# Patient Record
Sex: Female | Born: 1955
Health system: Southern US, Community
[De-identification: ages and names within clinical notes are randomized; demographics above are authoritative.]

## PROBLEM LIST (undated history)

## (undated) DIAGNOSIS — N809 Endometriosis, unspecified: Secondary | ICD-10-CM

## (undated) DIAGNOSIS — IMO0002 Reserved for concepts with insufficient information to code with codable children: Secondary | ICD-10-CM

## (undated) DIAGNOSIS — M1990 Unspecified osteoarthritis, unspecified site: Secondary | ICD-10-CM

## (undated) HISTORY — DX: Endometriosis, unspecified: N80.9

## (undated) HISTORY — DX: Reserved for concepts with insufficient information to code with codable children: IMO0002

---

## 1986-06-03 HISTORY — PX: OVARIAN CYST REMOVAL: SHX89

## 1987-06-04 HISTORY — PX: LAPAROSCOPY: SHX197

## 1999-10-04 ENCOUNTER — Encounter: Admission: RE | Admit: 1999-10-04 | Discharge: 1999-10-04 | Payer: Self-pay | Admitting: Obstetrics and Gynecology

## 1999-10-04 ENCOUNTER — Encounter: Payer: Self-pay | Admitting: *Deleted

## 1999-10-11 ENCOUNTER — Other Ambulatory Visit: Admission: RE | Admit: 1999-10-11 | Discharge: 1999-10-11 | Payer: Self-pay | Admitting: *Deleted

## 2000-10-10 ENCOUNTER — Encounter: Admission: RE | Admit: 2000-10-10 | Discharge: 2000-10-10 | Payer: Self-pay | Admitting: *Deleted

## 2000-10-10 ENCOUNTER — Other Ambulatory Visit: Admission: RE | Admit: 2000-10-10 | Discharge: 2000-10-10 | Payer: Self-pay | Admitting: *Deleted

## 2000-10-10 ENCOUNTER — Encounter: Payer: Self-pay | Admitting: *Deleted

## 2001-11-03 ENCOUNTER — Encounter: Payer: Self-pay | Admitting: *Deleted

## 2001-11-03 ENCOUNTER — Encounter: Admission: RE | Admit: 2001-11-03 | Discharge: 2001-11-03 | Payer: Self-pay | Admitting: *Deleted

## 2001-12-31 ENCOUNTER — Other Ambulatory Visit: Admission: RE | Admit: 2001-12-31 | Discharge: 2001-12-31 | Payer: Self-pay | Admitting: *Deleted

## 2003-01-20 ENCOUNTER — Encounter: Payer: Self-pay | Admitting: *Deleted

## 2003-01-20 ENCOUNTER — Encounter: Admission: RE | Admit: 2003-01-20 | Discharge: 2003-01-20 | Payer: Self-pay | Admitting: *Deleted

## 2003-01-26 ENCOUNTER — Other Ambulatory Visit: Admission: RE | Admit: 2003-01-26 | Discharge: 2003-01-26 | Payer: Self-pay | Admitting: *Deleted

## 2003-08-04 ENCOUNTER — Encounter: Admission: RE | Admit: 2003-08-04 | Discharge: 2003-08-04 | Payer: Self-pay | Admitting: Family Medicine

## 2004-02-24 ENCOUNTER — Other Ambulatory Visit: Admission: RE | Admit: 2004-02-24 | Discharge: 2004-02-24 | Payer: Self-pay | Admitting: *Deleted

## 2004-03-05 ENCOUNTER — Encounter: Admission: RE | Admit: 2004-03-05 | Discharge: 2004-03-05 | Payer: Self-pay | Admitting: *Deleted

## 2004-04-11 ENCOUNTER — Encounter: Admission: RE | Admit: 2004-04-11 | Discharge: 2004-04-11 | Payer: Self-pay | Admitting: Neurology

## 2004-05-08 ENCOUNTER — Encounter: Admission: RE | Admit: 2004-05-08 | Discharge: 2004-05-08 | Payer: Self-pay | Admitting: Neurosurgery

## 2004-05-08 ENCOUNTER — Encounter: Payer: Self-pay | Admitting: Neurosurgery

## 2004-05-25 ENCOUNTER — Encounter: Admission: RE | Admit: 2004-05-25 | Discharge: 2004-05-25 | Payer: Self-pay | Admitting: Neurosurgery

## 2004-06-03 HISTORY — PX: SPINAL FUSION: SHX223

## 2004-06-15 ENCOUNTER — Encounter: Admission: RE | Admit: 2004-06-15 | Discharge: 2004-06-15 | Payer: Self-pay | Admitting: *Deleted

## 2004-06-22 ENCOUNTER — Ambulatory Visit (HOSPITAL_COMMUNITY): Admission: RE | Admit: 2004-06-22 | Discharge: 2004-06-23 | Payer: Self-pay | Admitting: Neurosurgery

## 2005-02-26 ENCOUNTER — Other Ambulatory Visit: Admission: RE | Admit: 2005-02-26 | Discharge: 2005-02-26 | Payer: Self-pay | Admitting: *Deleted

## 2005-05-01 ENCOUNTER — Encounter: Admission: RE | Admit: 2005-05-01 | Discharge: 2005-05-01 | Payer: Self-pay | Admitting: *Deleted

## 2006-01-24 ENCOUNTER — Ambulatory Visit: Payer: Self-pay | Admitting: Sports Medicine

## 2006-02-28 ENCOUNTER — Other Ambulatory Visit: Admission: RE | Admit: 2006-02-28 | Discharge: 2006-02-28 | Payer: Self-pay | Admitting: Obstetrics & Gynecology

## 2006-05-07 ENCOUNTER — Encounter: Admission: RE | Admit: 2006-05-07 | Discharge: 2006-05-07 | Payer: Self-pay | Admitting: Obstetrics and Gynecology

## 2007-03-31 ENCOUNTER — Other Ambulatory Visit: Admission: RE | Admit: 2007-03-31 | Discharge: 2007-03-31 | Payer: Self-pay | Admitting: Obstetrics & Gynecology

## 2007-04-17 ENCOUNTER — Ambulatory Visit: Payer: Self-pay | Admitting: Internal Medicine

## 2007-05-11 ENCOUNTER — Encounter: Admission: RE | Admit: 2007-05-11 | Discharge: 2007-05-11 | Payer: Self-pay | Admitting: Obstetrics and Gynecology

## 2007-07-03 ENCOUNTER — Ambulatory Visit: Payer: Self-pay | Admitting: Internal Medicine

## 2007-07-03 ENCOUNTER — Encounter: Payer: Self-pay | Admitting: Internal Medicine

## 2008-05-02 ENCOUNTER — Other Ambulatory Visit: Admission: RE | Admit: 2008-05-02 | Discharge: 2008-05-02 | Payer: Self-pay | Admitting: Obstetrics and Gynecology

## 2008-07-20 ENCOUNTER — Encounter: Admission: RE | Admit: 2008-07-20 | Discharge: 2008-07-20 | Payer: Self-pay | Admitting: Obstetrics and Gynecology

## 2009-08-07 ENCOUNTER — Encounter: Admission: RE | Admit: 2009-08-07 | Discharge: 2009-08-07 | Payer: Self-pay | Admitting: Obstetrics and Gynecology

## 2009-12-26 ENCOUNTER — Ambulatory Visit: Payer: Self-pay | Admitting: Sports Medicine

## 2009-12-26 DIAGNOSIS — M7710 Lateral epicondylitis, unspecified elbow: Secondary | ICD-10-CM | POA: Insufficient documentation

## 2009-12-26 DIAGNOSIS — M25529 Pain in unspecified elbow: Secondary | ICD-10-CM | POA: Insufficient documentation

## 2010-02-07 ENCOUNTER — Ambulatory Visit: Payer: Self-pay | Admitting: Sports Medicine

## 2010-03-22 ENCOUNTER — Ambulatory Visit: Payer: Self-pay | Admitting: Sports Medicine

## 2010-06-23 ENCOUNTER — Encounter: Payer: Self-pay | Admitting: Neurosurgery

## 2010-06-24 ENCOUNTER — Encounter: Payer: Self-pay | Admitting: Obstetrics and Gynecology

## 2010-07-03 NOTE — Assessment & Plan Note (Signed)
Summary: f/u eo   Vital Signs:  Patient profile:   55 year old female BP sitting:   110 / 68  Vitals Entered By: Lillia Pauls CMA (February 07, 2010 4:05 PM)  History of Present Illness: Rachel Beasley returns saying that pain is 20% to 30% better however, this is with more activity she is playing tennis again she is up to 4 lbs on wrist rehab able to do exercises without much pain  pain really occurs more with lifting something with arm stretched out naprosyn helps pain when she has it  using NTG without probs  Physical Exam  General:  Well-developed,well-nourished,in no acute distress; alert,appropriate and cooperative throughout examination Msk:  RT elbow no TTP no swelling full ROM no pain with resisted wrist extension or holding book in extension  LT elbow mild TTP at tip of lat epidcondyle mild pain on resisted wrist and finger extenision no swelling full ROM can hold bood in ext w minimal pain now Additional Exam:  MSK [place] Left The hypeoechoic area of paartial tear seems to have healed still small avulsion at tip norm doppler flow now slt hypoechoic change on trans scan  RT hypoechoic areas seems to have resolved no tears no abn blood flow  images saved   Impression & Recommendations:  Problem # 1:  ELBOW PAIN, LEFT (ICD-719.42)  Pain continues to gradually lessen  would cont NTG for another 6 wks  keep up exercises move to 5 lbs  Orders: [place] LIMITED ([id number])  Problem # 2:  LATERAL EPICONDYLITIS, BILATERAL (ICD-726.32)  clinically and on [place] this is improved  will reck 6 wks repeat scan on RTC  Orders: [place] LIMITED ([id number])  Complete Medication List: 1)  Nitroglycerin 0.2 Mg/hr Pt24 (Nitroglycerin) .... 1/4 patch daily x 24 hours

## 2010-07-03 NOTE — Assessment & Plan Note (Signed)
Summary: TENNIS ELBOW,MC   Vital Signs:  Patient profile:   55 year old female Height:      64 inches Weight:      114 pounds BMI:     19.64 Pulse rate:   74 / minute BP sitting:   114 / 44  (left arm) CC: tennis elbow- left   CC:  tennis elbow- left.  History of Present Illness: 6 weeks ago she was working on backhand volleys felt pain that evening on left she uses 2 hander and is RT hand dominant  played a couple more times and pain worse so she quit x 5 weeks  during that time pain gradually less but still feels the pain  RT elbow had same about 4 yrs ago RT elbow not painful w tennis now  HX of C4/5 fusion bumped 1 wk ago and some radiating sxs into left shoulder this week  Physical Exam  General:  Well-developed,well-nourished,in no acute distress; alert,appropriate and cooperative throughout examination Msk:  spasm over left trapezius norm neck motion norm neuro fxn  TTP tip of left olecranon and over lat epicondyle resisted ext of 3rd finger and of wrist recreat pain holding book in extension is painful  RT is not TTP all above tests are neg full ROM both elbows Additional Exam:  MSK [place] avulsion fx at tip of RT that is small still a hypoechoic area w mild separation this has norm doppler flow  Left shows larger avulsion small hypoechoic area some doppler activity  images saved   Impression & Recommendations:  Problem # 1:  ELBOW PAIN, LEFT (ICD-719.42)  since left is sxtomatic will Tx w ntg and see if we can speed healing  Orders: [place] LIMITED ([id number])  Problem # 2:  LATERAL EPICONDYLITIS, BILATERAL (ICD-726.32)  since both show chronic changes start rehab exercises on both sides build strength x 2 wks then ease back into tennis  reck 6 wks  Orders: [place] LIMITED ([id number])  Complete Medication List: 1)  Nitroglycerin 0.2 Mg/hr Pt24 (Nitroglycerin) .... 1/4 patch daily x 24 hours  Patient Instructions: 1)  elbow exercises 2)  start 1  lb 3)  3 sets of 15 4)  2 exercises - wrist curl - down slow up fast 5)  wrist roll - over slow back fast 6)  then tennis stroke exercises 1 set of 15 each stroke 7)  each week add 1 lb 8)  once daily at least and twice if time allows 9)  after 2 weeks of exercises and NTG you can play 10)  NTG patches 11)  use 1/4 patch and leave on all day/ replace next morning 12)  put near area of irritation 13)  just left elbow at first 14)  after playing you can quickly use a bit of ice but not much 15)  use tennis elbow braces 16)  reck 6 weeks and rescan Prescriptions: NITROGLYCERIN 0.2 MG/HR PT24 (NITROGLYCERIN) 1/4 patch daily x 24 hours  #30 x 1   Entered and Authorized by:   Enid Baas MD   Signed by:   Enid Baas MD on 12/26/2009   Method used:   Electronically to        [facility]  [phone]* (retail)       [address] [place], [place]  [address]       Ph: [phone] or [phone]       Fax: [phone]   RxID:   [id number]

## 2010-07-03 NOTE — Assessment & Plan Note (Signed)
Summary: F/U,MC   History of Present Illness: pt here today to follow up left tennis elbow pain, which she says is about 60-70% better from last visit. it is to the point now she is even taking less naprosyn in the last week than she has in months. pt is still using the ntg patch without side effects. she is currently still playing tennis 2-3 times a weeks without discomfort. only time pt experiences pain is when she does pushups, which she recently began doing.  Physical Exam  General:  Well-developed,well-nourished,in no acute distress; alert,appropriate and cooperative throughout examination Msk:  full strength on elbow testing w: wrist extensino finger extension grip  rotation on eccentric finger extension feels tendon but not painful  SltTTP just at tip of lat epicond  can hold 3 lb book in full ext without pain   Impression & Recommendations:  Problem # 1:  ELBOW PAIN, LEFT (ICD-719.42) Assessment Improved this is now pretty rare x after a lot of tennis  Problem # 2:  LATERAL EPICONDYLITIS, BILATERAL (ICD-726.32) clinically and by [place] this appears healed   I would cut patched to every other day and then wean off completely if no diff  use brace until weaned off patches and then wean off this  d/c brace on RT arm  keep up exercises 3 x week  reck prn  Complete Medication List: 1)  Nitroglycerin 0.2 Mg/hr Pt24 (Nitroglycerin) .... 1/4 patch daily x 24 hours   Orders Added: 1)  Est. Patient Level II [16109]

## 2010-08-17 ENCOUNTER — Other Ambulatory Visit: Payer: Self-pay | Admitting: Obstetrics and Gynecology

## 2010-08-17 DIAGNOSIS — Z1231 Encounter for screening mammogram for malignant neoplasm of breast: Secondary | ICD-10-CM

## 2010-08-30 ENCOUNTER — Ambulatory Visit
Admission: RE | Admit: 2010-08-30 | Discharge: 2010-08-30 | Disposition: A | Discharge status: Home / Self Care | Payer: 59 | Source: Ambulatory Visit | Attending: Obstetrics and Gynecology | Admitting: Obstetrics and Gynecology

## 2010-08-30 DIAGNOSIS — Z1231 Encounter for screening mammogram for malignant neoplasm of breast: Secondary | ICD-10-CM

## 2010-10-19 NOTE — Op Note (Signed)
NAME:  Rachel Beasley, Rachel Beasley                 ACCOUNT NO.:  [id number]   MEDICAL RECORD NO.:  [id number]          PATIENT TYPE:  OIB   LOCATION:  3004                         FACILITY:  [facility]   PHYSICIAN:  Hilda Lias, M.D.   DATE OF BIRTH:  [birth year]   DATE OF PROCEDURE:  DATE OF DISCHARGE:  06/23/2004                                 OPERATIVE REPORT   PREOPERATIVE DIAGNOSES:  C5-C6 herniated disk with C6 radiculopathy, a small  tiny incidental spondylosis at C4-C5, and small foraminal spondylosis, C6-  C7, left.   POSTOPERATIVE DIAGNOSES:  C5-C6 herniated disk with C6 radiculopathy, a  small tiny incidental spondylosis at C4-C5, and small foraminal spondylosis,  C6-C7, left.   PROCEDURE:  Anterior C5-C6 diskectomy, decompression of the spinal cord,  removal of large fragments of disk, 1 of them calcified and 2 soft.  Interbody fusion with allograft, 6-mm plate with 4 screws.  Microscope.   SURGEON:  Hilda Lias, M.D.   ASSISTANT:  Hewitt Shorts, M.D.   CLINICAL HISTORY:  The patient had the complaint of neck pain with radiation  to the right upper extremity.  This had been going on for at least 3-4  months.  She has had conservative treatment including a nerve root injection  without any improvement.  She continued to get worse and last week, she and  her husband decided to go ahead with surgery.  The patient has a tiny  spondylosis between C4-C5 and C6-C7 and knows that there is a possibility  that she might require surgery done in the future.   DESCRIPTION OF PROCEDURE:  The patient was taken to the OR and incision was  made in the left side of the neck after the patient's neck was prepped with  Betadine.  A transverse incision was carried down through the skin and  platysma down to the cervical spine.  X-rays showed that we were at the  level of C5-C6.  From then on, the anterior ligament was opened.  With the  microscope, we did the total gross diskectomy.  Indeed,  there was an opening  in the posterior ligament with a fragment of disk going centrally and to the  right.  There were at least 2 or 3 of them which were soft and 1 of them  which was calcified.  The C6 nerve root was swollen and reddish.  Decompression was achieved not only in the right side but also in the left  side.  The midline spondylosis also was removed.  Then, the endplates were  drilled.  Investigation of the foramen bilaterally with a nerve hook was  negative.  Then, an allograft of 6 mm was inserted followed by a plate with  4 screws.  Lateral C-spine showed good position of  the graft and the plate.  The wound was irrigated.  Hemostasis was done with  bipolar.  We waited 10 minutes and after having good hemostasis, the area  was closed with Vicryl.  The patient did well, and she is going to go to the  recovery room.  EB/MEDQ  D:  06/22/2004  T:  06/23/2004  Job:  [id number]   cc:   Marlan Palau, M.D.  [address]  [address]  [place]  [place] [address]  Fax: [phone]

## 2010-10-19 NOTE — H&P (Signed)
NAME:  Rachel Beasley, Rachel Beasley                 ACCOUNT NO.:  [id number]   MEDICAL RECORD NO.:  [id number]          PATIENT TYPE:  OIB   LOCATION:                               FACILITY:  [facility]   PHYSICIAN:  Hilda Lias, M.D.   DATE OF BIRTH:  10-18-1955   DATE OF ADMISSION:  06/22/2004  DATE OF DISCHARGE:                                HISTORY & PHYSICAL   HISTORY OF PRESENT ILLNESS:  [name] is a lady who has not been seen by me  since 04/19/03 complaining of neck pain, weakness, and numbness going to the  right upper extremity.  She had an MRI.  I brought her to my office and,  after we looked at the MRI, we decided to go ahead with conservative  treatment.  She was given medication for pain, anti-inflammatory.  Also, she  had a selected nerve root injection at the level of 5-6.  The patient has  good days and bad days but, lately, she is getting worse, more pain and more  weakness.  Because of that, she decided to go ahead with surgery.   PAST MEDICAL HISTORY:  She had an ovarian cyst removed on C section.   ALLERGIES:  She is not allergic to any medication.   SOCIAL HISTORY:  She does not smoke.  She drinks socially.   FAMILY HISTORY:  Mother is 9 in good condition.  Father is 60 with  carcinoma of the prostate.   REVIEW OF SYSTEMS:  Positive for neck and right upper extremity pain.   MEDICATIONS:  She is taking Serafen.   PHYSICAL EXAMINATION:  Head, eyes, ears, nose, and throat normal.  Neck:  She is able to flex, but extends in all directions causes pain that goes to  the right shoulder.  Lungs clear.  Heart sounds normal.  Abdomen normal.  Extremities:  Normal pulses.  _________ normal.  Cardiac:  Normal.  She has  weakness of the right biceps, 2/5, and the wrist extensor is 2/5.  Deltoid  and triceps are normal.  Reflexes symmetrical with absence on the right  biceps.  Coordination and gait normal.   LABORATORY DATA:  The MRI showed that she has a herniated disk at the  level  of 5-6, most to the center and to the right side.   CLINICAL IMPRESSION:  C5-C6 herniated disk.  I talked to the patient at  length.  The procedure would be an anterior fusion of the level of 5-6 with  decompression of the spinal cord.  We are going to replace the disk with a  bone plate.  The surgery was fully explained to her and her husband.  Her  husband is Dr. Graciela Husbands from the cardiology  service.  The risks, of course, are infection, cerebrospinal fluid leak,  damage to the vocal cord, damage to the esophagus, damage to the trachea,  stroke, damage to the vertebral artery, need for further surgery because of  failure of the bone graft of the plate.        ___________________________________________  Hilda Lias, M.D.  EB/MEDQ  D:  06/22/2004  T:  06/22/2004  Job:  [id number]

## 2011-09-05 ENCOUNTER — Other Ambulatory Visit: Payer: Self-pay | Admitting: Obstetrics & Gynecology

## 2011-09-05 DIAGNOSIS — Z1231 Encounter for screening mammogram for malignant neoplasm of breast: Secondary | ICD-10-CM

## 2011-09-09 ENCOUNTER — Ambulatory Visit
Admission: RE | Admit: 2011-09-09 | Discharge: 2011-09-09 | Disposition: A | Discharge status: Home / Self Care | Payer: 59 | Source: Ambulatory Visit | Attending: Obstetrics & Gynecology | Admitting: Obstetrics & Gynecology

## 2011-09-09 DIAGNOSIS — Z1231 Encounter for screening mammogram for malignant neoplasm of breast: Secondary | ICD-10-CM

## 2012-07-03 ENCOUNTER — Encounter: Payer: Self-pay | Admitting: Internal Medicine

## 2012-08-19 ENCOUNTER — Other Ambulatory Visit (HOSPITAL_COMMUNITY): Payer: Self-pay | Admitting: Neurosurgery

## 2012-08-19 DIAGNOSIS — M47812 Spondylosis without myelopathy or radiculopathy, cervical region: Secondary | ICD-10-CM

## 2012-08-19 DIAGNOSIS — M5030 Other cervical disc degeneration, unspecified cervical region: Secondary | ICD-10-CM

## 2012-08-19 DIAGNOSIS — M5412 Radiculopathy, cervical region: Secondary | ICD-10-CM

## 2012-08-21 ENCOUNTER — Other Ambulatory Visit (HOSPITAL_COMMUNITY): Payer: 59

## 2012-08-24 ENCOUNTER — Ambulatory Visit (HOSPITAL_COMMUNITY)
Admission: RE | Admit: 2012-08-24 | Discharge: 2012-08-24 | Disposition: A | Discharge status: Home / Self Care | Payer: 59 | Source: Ambulatory Visit | Attending: Neurosurgery | Admitting: Neurosurgery

## 2012-08-24 DIAGNOSIS — Z981 Arthrodesis status: Secondary | ICD-10-CM | POA: Insufficient documentation

## 2012-08-24 DIAGNOSIS — M5030 Other cervical disc degeneration, unspecified cervical region: Secondary | ICD-10-CM

## 2012-08-24 DIAGNOSIS — R29898 Other symptoms and signs involving the musculoskeletal system: Secondary | ICD-10-CM | POA: Insufficient documentation

## 2012-08-24 DIAGNOSIS — M4802 Spinal stenosis, cervical region: Secondary | ICD-10-CM | POA: Insufficient documentation

## 2012-08-24 DIAGNOSIS — M5020 Other cervical disc displacement, unspecified cervical region: Secondary | ICD-10-CM | POA: Insufficient documentation

## 2012-08-24 DIAGNOSIS — M5412 Radiculopathy, cervical region: Secondary | ICD-10-CM

## 2012-08-24 DIAGNOSIS — M5380 Other specified dorsopathies, site unspecified: Secondary | ICD-10-CM | POA: Insufficient documentation

## 2012-08-24 DIAGNOSIS — M47812 Spondylosis without myelopathy or radiculopathy, cervical region: Secondary | ICD-10-CM | POA: Insufficient documentation

## 2012-09-02 ENCOUNTER — Other Ambulatory Visit: Payer: Self-pay | Admitting: Neurosurgery

## 2012-09-03 ENCOUNTER — Encounter: Payer: Self-pay | Admitting: Gynecology

## 2012-09-03 ENCOUNTER — Ambulatory Visit (INDEPENDENT_AMBULATORY_CARE_PROVIDER_SITE_OTHER): Payer: 59 | Admitting: Gynecology

## 2012-09-03 ENCOUNTER — Telehealth: Payer: Self-pay | Admitting: Nurse Practitioner

## 2012-09-03 VITALS — BP 114/64 | Temp 97.0°F | Wt 112.0 lb

## 2012-09-03 DIAGNOSIS — N390 Urinary tract infection, site not specified: Secondary | ICD-10-CM

## 2012-09-03 LAB — POCT URINALYSIS DIPSTICK: pH, UA: 7

## 2012-09-03 MED ORDER — PHENAZOPYRIDINE HCL 200 MG PO TABS: 200.0000 mg | ORAL_TABLET | Freq: Three times a day (TID) | ORAL | Status: DC | PRN

## 2012-09-03 MED ORDER — NITROFURANTOIN MONOHYD MACRO 100 MG PO CAPS: 100.0000 mg | ORAL_CAPSULE | Freq: Two times a day (BID) | ORAL | Status: DC

## 2012-09-03 NOTE — Telephone Encounter (Signed)
Pt has an UTI an would like to know if something could be called into the pharmacy

## 2012-09-03 NOTE — Telephone Encounter (Signed)
Patient complaining of burning with urination and urinary frequency for four days.  Denies back pain or fever.  OV today 145.

## 2012-09-03 NOTE — Progress Notes (Signed)
Urinary Tract Infection: Patient complains of abnormal smelling urine, burning with urination and frequency She has had symptoms for 4 days. Patient also complains of none. Patient denies back pain, fever and vaginal discharge. Patient does not have a history of recurrent UTI.  Patient does not have a history of pyelonephritis. Pt concerned because going out of town for 4 days.  Pt reports recent self treated sx while on cruise took 2d of cipro.  Review of Systems - Negative except per HPI  Physical Examination: General appearance - alert, well appearing, and in no distress Abdomen - soft, nontender, nondistended, no masses or organomegaly, no spurapubic tenderness Back exam - no CVAT Neurological - alert, oriented, normal speech, no focal findings or movement disorder noted  A/P: Probable UTI Urine cultlure Rx macrobid, pyridium sent

## 2012-09-03 NOTE — Patient Instructions (Signed)
Urinary Tract Infection Urinary tract infections (UTIs) can develop anywhere along your urinary tract. Your urinary tract is your body's drainage system for removing wastes and extra water. Your urinary tract includes two kidneys, two ureters, a bladder, and a urethra. Your kidneys are a pair of bean-shaped organs. Each kidney is about the size of your fist. They are located below your ribs, one on each side of your spine. CAUSES Infections are caused by microbes, which are microscopic organisms, including fungi, viruses, and bacteria. These organisms are so small that they can only be seen through a microscope. Bacteria are the microbes that most commonly cause UTIs. SYMPTOMS  Symptoms of UTIs may vary by age and gender of the patient and by the location of the infection. Symptoms in young women typically include a frequent and intense urge to urinate and a painful, burning feeling in the bladder or urethra during urination. Older women and men are more likely to be tired, shaky, and weak and have muscle aches and abdominal pain. A fever may mean the infection is in your kidneys. Other symptoms of a kidney infection include pain in your back or sides below the ribs, nausea, and vomiting. DIAGNOSIS To diagnose a UTI, your caregiver will ask you about your symptoms. Your caregiver also will ask to provide a urine sample. The urine sample will be tested for bacteria and white blood cells. White blood cells are made by your body to help fight infection. TREATMENT  Typically, UTIs can be treated with medication. Because most UTIs are caused by a bacterial infection, they usually can be treated with the use of antibiotics. The choice of antibiotic and length of treatment depend on your symptoms and the type of bacteria causing your infection. HOME CARE INSTRUCTIONS  If you were prescribed antibiotics, take them exactly as your caregiver instructs you. Finish the medication even if you feel better after you  have only taken some of the medication.  Drink enough water and fluids to keep your urine clear or pale yellow.  Avoid caffeine, tea, and carbonated beverages. They tend to irritate your bladder.  Empty your bladder often. Avoid holding urine for long periods of time.  Empty your bladder before and after sexual intercourse.  After a bowel movement, women should cleanse from front to back. Use each tissue only once. SEEK MEDICAL CARE IF:   You have back pain.  You develop a fever.  Your symptoms do not begin to resolve within 3 days. SEEK IMMEDIATE MEDICAL CARE IF:   You have severe back pain or lower abdominal pain.  You develop chills.  You have nausea or vomiting.  You have continued burning or discomfort with urination. MAKE SURE YOU:   Understand these instructions.  Will watch your condition.  Will get help right away if you are not doing well or get worse. Document Released: 02/27/2005 Document Revised: 11/19/2011 Document Reviewed: 06/28/2011 ExitCare Patient Information 2013 [employer].  

## 2012-09-06 LAB — CULTURE, URINE COMPREHENSIVE: Colony Count: >=100000

## 2012-09-10 ENCOUNTER — Encounter (HOSPITAL_COMMUNITY)
Admission: RE | Admit: 2012-09-10 | Discharge: 2012-09-10 | Disposition: A | Discharge status: Home / Self Care | Payer: 59 | Source: Ambulatory Visit | Attending: Neurosurgery | Admitting: Neurosurgery

## 2012-09-10 ENCOUNTER — Ambulatory Visit (INDEPENDENT_AMBULATORY_CARE_PROVIDER_SITE_OTHER): Payer: 59 | Admitting: Gynecology

## 2012-09-10 ENCOUNTER — Encounter (HOSPITAL_COMMUNITY): Payer: Self-pay

## 2012-09-10 VITALS — BP 96/58 | HR 60 | Resp 16

## 2012-09-10 DIAGNOSIS — N390 Urinary tract infection, site not specified: Secondary | ICD-10-CM

## 2012-09-10 DIAGNOSIS — M5030 Other cervical disc degeneration, unspecified cervical region: Secondary | ICD-10-CM | POA: Insufficient documentation

## 2012-09-10 DIAGNOSIS — M47812 Spondylosis without myelopathy or radiculopathy, cervical region: Secondary | ICD-10-CM | POA: Insufficient documentation

## 2012-09-10 DIAGNOSIS — Z01812 Encounter for preprocedural laboratory examination: Secondary | ICD-10-CM | POA: Insufficient documentation

## 2012-09-10 LAB — SURGICAL PCR SCREEN
MRSA, PCR: NEGATIVE
Staphylococcus aureus: NEGATIVE

## 2012-09-10 LAB — CBC
MCH: 32.4 pg (ref 26.0–34.0)
MCHC: 35 g/dL (ref 30.0–36.0)
RDW: 12 % (ref 11.5–15.5)

## 2012-09-10 NOTE — Progress Notes (Signed)
Primary Physician - Leda Quail Does not have a cardiologist - no previous cardiac testing

## 2012-09-10 NOTE — Pre-Procedure Instructions (Addendum)
Rachel Beasley  09/10/2012   Your procedure is scheduled on: Monday, April 14th   Report to [facility] at  7:00 AM.             (Arrival time is per your surgeon's request)   Call this number if you have problems the morning of surgery: [phone]   Remember:   Do not eat food or drink liquids after midnight Sunday.   Take these medicines the morning of surgery with A SIP OF WATER: Xanax, Pyridium   Do not wear jewelry, make-up or nail polish.  Do not wear lotions, powders, or perfumes. You may NOT  wear deodorant.  Do not shave underarms & legs 48 hours prior to surgery.              Do not bring valuables to the hospital.  Contacts, dentures or bridgework may not be worn into surgery.   Leave suitcase in the car. After surgery it may be brought to your room.  For patients admitted to the hospital, checkout time is 11:00 AM the day of discharge.   Name and phone number of your driver:    Special Instructions: Shower using CHG 2 nights before surgery and the night before surgery.  If you shower the day of surgery use CHG.  Use special wash - you have one bottle of CHG for all showers.  You should use approximately 1/3 of the bottle for each shower.   Please read over the following fact sheets that you were given: Pain Booklet, Coughing and Deep Breathing, MRSA Information and Surgical Site Infection Prevention

## 2012-09-10 NOTE — Progress Notes (Signed)
Patient here for urine test of cure. States she is feeling much better. Will finish her antibiotic tonight.

## 2012-09-12 LAB — URINE CULTURE
Colony Count: NO GROWTH
Organism ID, Bacteria: NO GROWTH

## 2012-09-14 ENCOUNTER — Encounter (HOSPITAL_COMMUNITY): Admission: RE | Payer: Self-pay | Source: Ambulatory Visit

## 2012-09-14 ENCOUNTER — Ambulatory Visit (HOSPITAL_COMMUNITY): Admission: RE | Admit: 2012-09-14 | Payer: 59 | Source: Ambulatory Visit | Admitting: Neurosurgery

## 2012-09-14 SURGERY — ANTERIOR CERVICAL DECOMPRESSION/DISCECTOMY FUSION 1 LEVEL
Anesthesia: General

## 2012-09-15 ENCOUNTER — Other Ambulatory Visit: Payer: Self-pay | Admitting: *Deleted

## 2012-09-15 MED ORDER — VITAMIN D (ERGOCALCIFEROL) 1.25 MG (50000 UNIT) PO CAPS: 50000.0000 [IU] | ORAL_CAPSULE | ORAL | Status: DC

## 2012-09-15 NOTE — Telephone Encounter (Signed)
Sent!

## 2012-09-17 ENCOUNTER — Ambulatory Visit: Payer: Self-pay | Admitting: Nurse Practitioner

## 2012-09-17 ENCOUNTER — Telehealth: Payer: Self-pay | Admitting: Nurse Practitioner

## 2012-09-17 MED ORDER — VITAMIN D (ERGOCALCIFEROL) 1.25 MG (50000 UNIT) PO CAPS: 50000.0000 [IU] | ORAL_CAPSULE | ORAL | Status: DC

## 2012-09-17 MED ORDER — PROGESTERONE MICRONIZED 100 MG PO CAPS: 100.0000 mg | ORAL_CAPSULE | Freq: Every day | ORAL | Status: DC

## 2012-09-17 NOTE — Telephone Encounter (Signed)
Pt need refills on the vit d and progesterone 100mg  sent to [facility] [phone]

## 2012-09-17 NOTE — Telephone Encounter (Signed)
Ok to refill until aex 

## 2012-09-17 NOTE — Telephone Encounter (Signed)
Pt has appt scheduled on 10/07/2012, ok to refill progeterone? Pt was given Rx for Vit D x21yr in 09/2012.

## 2012-09-17 NOTE — Telephone Encounter (Signed)
Routed to DL 

## 2012-09-17 NOTE — Telephone Encounter (Signed)
Progesterone 100 mg #30/0; vit d 50,000 iu #8/0rf's sent to [facility]; pt is aware.

## 2012-09-21 ENCOUNTER — Encounter: Payer: Self-pay | Admitting: *Deleted

## 2012-09-22 ENCOUNTER — Other Ambulatory Visit: Payer: Self-pay

## 2012-09-22 DIAGNOSIS — Z1231 Encounter for screening mammogram for malignant neoplasm of breast: Secondary | ICD-10-CM

## 2012-10-01 ENCOUNTER — Ambulatory Visit: Payer: Self-pay | Admitting: Nurse Practitioner

## 2012-10-06 ENCOUNTER — Encounter: Payer: Self-pay | Admitting: Nurse Practitioner

## 2012-10-07 ENCOUNTER — Ambulatory Visit (INDEPENDENT_AMBULATORY_CARE_PROVIDER_SITE_OTHER): Payer: 59 | Admitting: Obstetrics & Gynecology

## 2012-10-07 ENCOUNTER — Ambulatory Visit: Payer: Self-pay | Admitting: Nurse Practitioner

## 2012-10-07 ENCOUNTER — Encounter: Payer: Self-pay | Admitting: Obstetrics & Gynecology

## 2012-10-07 VITALS — BP 100/60 | Ht 64.25 in | Wt 112.0 lb

## 2012-10-07 DIAGNOSIS — Z0000 Encounter for general adult medical examination without abnormal findings: Secondary | ICD-10-CM

## 2012-10-07 DIAGNOSIS — Z01419 Encounter for gynecological examination (general) (routine) without abnormal findings: Secondary | ICD-10-CM

## 2012-10-07 MED ORDER — VITAMIN D (ERGOCALCIFEROL) 1.25 MG (50000 UNIT) PO CAPS: 50000.0000 [IU] | ORAL_CAPSULE | ORAL | Status: DC

## 2012-10-07 MED ORDER — PROGESTERONE MICRONIZED 100 MG PO CAPS: 100.0000 mg | ORAL_CAPSULE | Freq: Every evening | ORAL | Status: DC

## 2012-10-07 MED ORDER — ALPRAZOLAM 0.25 MG PO TABS: 0.2500 mg | ORAL_TABLET | Freq: Every evening | ORAL | Status: DC | PRN

## 2012-10-07 MED ORDER — ESTRADIOL 0.05 MG/24HR TD PTTW: 1.0000 | MEDICATED_PATCH | TRANSDERMAL | Status: DC

## 2012-10-07 NOTE — Progress Notes (Signed)
57 y.o. [id number] MarriedCaucasianF here for annual exam.  Had ultrasound here 4/13 due to spotting.  Endometrial biopsy showed atrophic appearing endometrium.  46 x 11mm hydrosalpinx on left noted with first ultrasound.  This resolved with follow-up ultrasound 8/14.   HRT switched to Minivelle 0.05mg  patches and cyclic progesterone days 1-15.  She noted she was sleeping much better with the progesterone so asked if she could take daily.  Has had no vaginal bleeding.  O/W doing quite well.   Patient's last menstrual period was 08/12/2011.          Sexually active: yes  The current method of family planning is none.    Exercising: yes  walk, tennis, aerobics, weights, and wall push ups-limited right now secondary to tricep issue Smoker:  no  Health Maintenance: Pap:  08/12/11 WNL/negative HR HPV MMG:  09/09/11 negative- has one scheduled Colonoscopy:  2009, polyps, due this year BMD:   2010 TDaP:  2014 Labs:  Cholesterol 2013 normal here,  Hb 14.3   reports that she has never smoked. She has never used smokeless tobacco. She reports that  drinks alcohol. She reports that she does not use illicit drugs.  Past Medical History  Diagnosis Date  . Dyspareunia   . PONV (postoperative nausea and vomiting)   . Endometriosis     Past Surgical History  Procedure Laterality Date  . Cesarean section  1991  . Laparoscopy  1989  . Ovarian cyst removal  1988    benign- endometrioma- laparatomy then danazal 6 months  . Spinal fusion  2006    C5-6 rupture    Current Outpatient Prescriptions  Medication Sig Dispense Refill  . ALPRAZolam (XANAX) 0.25 MG tablet Take 0.125 mg by mouth at bedtime as needed for sleep.      . Calcium Carbonate-Vitamin D (CALCIUM 500 + D PO) Take 1 tablet by mouth daily.      Marland Kitchen estradiol (MINIVELLE) 0.05 MG/24HR Place 1 patch onto the skin 2 (two) times a week. Sunday and Thursday      . ibuprofen (ADVIL,MOTRIN) 200 MG tablet Take 600 mg by mouth 3 (three) times daily as  needed for pain.      . progesterone (PROMETRIUM) 100 MG capsule Take 100 mg by mouth every evening.       . Vitamin D, Ergocalciferol, (DRISDOL) 50000 UNITS CAPS Take 1 capsule (50,000 Units total) by mouth every 7 (seven) days. On Mondays  8 capsule  0   No current facility-administered medications for this visit.    Family History  Problem Relation Age of Onset  . Diabetes Maternal Grandfather   . Heart disease Maternal Grandfather   . Polymyalgia rheumatica Father   . Heart disease Paternal Grandfather     ROS:  Pertinent items are noted in HPI.  Otherwise, a comprehensive ROS was negative.  Exam:   BP 100/60  Ht 5' 4.25" (1.632 m)  Wt 112 lb (50.803 kg)  BMI 19.07 kg/m2  LMP 08/12/2011  Weight change: -2lb Height:   Height: 5' 4.25" (163.2 cm)  Ht Readings from Last 3 Encounters:  10/07/12 5' 4.25" (1.632 m)  09/10/12 5\' 4"  (1.626 m)  12/26/09 5\' 4"  (1.626 m)    General appearance: alert, cooperative and appears stated age Head: Normocephalic, without obvious abnormality, atraumatic Neck: no adenopathy, supple, symmetrical, trachea midline and thyroid normal to inspection and palpation Lungs: clear to auscultation bilaterally Breasts: normal appearance, no masses or tenderness Heart: regular rate and rhythm Abdomen: soft,  non-tender; bowel sounds normal; no masses,  no organomegaly Extremities: extremities normal, atraumatic, no cyanosis or edema Skin: Skin color, texture, turgor normal. No rashes or lesions Lymph nodes: Cervical, supraclavicular, and axillary nodes normal. No abnormal inguinal nodes palpated Neurologic: Grossly normal   Pelvic: External genitalia:  no lesions              Urethra:  normal appearing urethra with no masses, tenderness or lesions              Bartholins and Skenes: normal                 Vagina: normal appearing vagina with normal color and discharge, no lesions              Cervix: no lesions              Pap taken: no Bimanual  Exam:  Uterus:  normal size, contour, position, consistency, mobility, non-tender              Adnexa: normal adnexa and no mass, fullness, tenderness               Rectovaginal: Confirms               Anus:  normal sphincter tone, no lesions  A:  Well Woman with normal exam PMP, on HRT H/O endometriosis Insomnia Colonic polyps  P:   Mammogram yearly.  D/W pt 3D MMG. Pap smear with HPV neg 1/13. Continue Minivelle 0.05mg  patches twice weekly and prometrium 100mg  qd.  RXs for one year to pharmacy. Xanax 0.25mg  prn anxiety/insomnia.  Gets #30/1RF yearly.  Rx given.  Vit D 50K units weekly.  Rx sent to pharmacy.  Recheck level next year. Pt will call if needs referral for colonoscopy. return annually or prn  An After Visit Summary was printed and given to the patient.

## 2012-10-07 NOTE — Patient Instructions (Addendum)

## 2012-10-19 ENCOUNTER — Ambulatory Visit: Payer: 59

## 2012-11-19 ENCOUNTER — Ambulatory Visit
Admission: RE | Admit: 2012-11-19 | Discharge: 2012-11-19 | Disposition: A | Discharge status: Home / Self Care | Payer: 59 | Source: Ambulatory Visit

## 2012-11-19 DIAGNOSIS — Z1231 Encounter for screening mammogram for malignant neoplasm of breast: Secondary | ICD-10-CM

## 2013-01-25 ENCOUNTER — Ambulatory Visit (INDEPENDENT_AMBULATORY_CARE_PROVIDER_SITE_OTHER): Payer: 59 | Admitting: Sports Medicine

## 2013-01-25 ENCOUNTER — Encounter: Payer: Self-pay | Admitting: Sports Medicine

## 2013-01-25 VITALS — BP 117/73 | HR 86 | Ht 64.0 in | Wt 115.0 lb

## 2013-01-25 DIAGNOSIS — M7750 Other enthesopathy of unspecified foot: Secondary | ICD-10-CM

## 2013-01-25 DIAGNOSIS — M79609 Pain in unspecified limb: Secondary | ICD-10-CM

## 2013-01-25 DIAGNOSIS — M7740 Metatarsalgia, unspecified foot: Secondary | ICD-10-CM

## 2013-01-25 DIAGNOSIS — M79671 Pain in right foot: Secondary | ICD-10-CM

## 2013-01-25 NOTE — Progress Notes (Signed)
  Subjective:    Patient ID: Rachel Beasley, female    DOB: [birth year], 57 y.o.   MRN: [id number]  HPI  Patient is a very friendly 57 year old Caucasian female who presents today with 4 months duration of right-sided foot pain. Pain is localized to the plantar surface of the distal metatarsals. Pain is described as dull in nature. Patient denies any acute injury to this area. She states that approximately 2 months ago she had multiple events which required her to wear heels--which she does not regularly do. Shortly thereafter she began experiencing this pain. Pain is not constant and seems to be worse with applying pressure. She denies any effusion, ecchymoses, erythema. NSAIDs have not helped this pain. However occasional icing has helped.   Patient is very active and regularly plays tennis. She is here today for clarification of the pain she is experiencing. She wants to ensure that there is no serious complication from this pain. And is interested in working towards alleviating this pain. PMH:  - Lateral epicondylitis; bilateral  - Elbow pain; left  - Never smoker Allergies:  - No known drug allergies Medications:  - Xanax 0.25 mg  - Calcium carbonate-vitamin D  - Estradiol 0.05 mg/24 hours  - Ibuprofen 200 mg  - Progesterone 100 mg  - Vitamin D, Ergocalciferol 50,000 units  Review of Systems Denies fever, chills, headache, cough, shortness of breath.    Objective:   Physical Exam General: Patient is a very pleasant well nourished well developed Caucasian female who appears of stated age. She is alert, oriented, in no acute distress. Vitals: Reviewed Ankle: Full ROM. Bilateral pes cavus. No gross erythema, ecchymoses, or swelling. Strength 5/5 bilaterally. Dorsalis pedis and posterior tibialis pulses strong and symmetric bilaterally. Stable lateral and medial ligaments. Moderate tenderness noted with palpation of the plantar surface of the second MTP joint. No pain at base of 5th MT. No  tenderness over navicular prominence. No tenderness at the lateral and medial malleolus. Negative tarsal tunnel tinel's. Able to walk 4 steps.  Ultrasound:  - Both transverse and sagittal views were obtained of the right sided second metatarsal, MTP joint, and proximal phalanx.   - Mild-to-moderate hypoechoic changes observed surrounding the dorsal surface of the second MTP joint; consistent with fluid accumulation and soft tissue swelling.  - Mild hypertrophic changes observed at the proximal head of the second proximal phalanx; findings consistent with osteophyte development. No fluid accumulation or swelling was appreciated. - No other evidence of stress fracture, tendinitis, or other bony/soft tissue pathologies were appreciated.      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  - Short-term custom orthotics with metatarsal pad were provided  - Recommendation for Aspercreme use (daily)  - Encouragements to ice foot regularly, and to use NSAIDs as needed 2) Follow-up: 4 weeks  - Plan is to consider formal long-term orthotic use, with metatarsal pad.   Note was dictated by Mickie Hillier, MS4. Under the supervision of Dr. Margaretha Sheffield.

## 2013-02-17 ENCOUNTER — Encounter: Payer: Self-pay | Admitting: Internal Medicine

## 2013-02-22 ENCOUNTER — Encounter: Payer: Self-pay | Admitting: Sports Medicine

## 2013-02-22 ENCOUNTER — Ambulatory Visit (INDEPENDENT_AMBULATORY_CARE_PROVIDER_SITE_OTHER): Payer: 59 | Admitting: Sports Medicine

## 2013-02-22 VITALS — BP 98/65 | HR 80 | Ht 64.0 in | Wt 115.0 lb

## 2013-02-22 DIAGNOSIS — M79609 Pain in unspecified limb: Secondary | ICD-10-CM

## 2013-02-22 DIAGNOSIS — M79671 Pain in right foot: Secondary | ICD-10-CM | POA: Insufficient documentation

## 2013-02-22 NOTE — Patient Instructions (Addendum)
Dear Mrs. Redmann,  It was nice to meet you. I'm glad that you're doing better. Continue to use the insoles in your running and tennis shoes. Continue to use the Aspercreme daily and ice as needed. In the future, you'll likely appreciate having the custom orthotic insoles for comfort and durability. Please call the clinic and make an appointment with Dr. Margaretha Sheffield for making of the orthotics whenever you would like.  Sincerely,  Dr. Clinton Sawyer

## 2013-02-22 NOTE — Progress Notes (Signed)
  Subjective:    Patient ID: Rachel Beasley, female    DOB: [birth year], 57 y.o.   MRN: [id number]  HPI  Rachel Beasley is a 57 year old female who presents for followup of right foot pain. She was evaluated in August in the pain was concerning for metatarsalgia of the distal second metatarsaland osteophyte development the proximal head of the second phalanx. She was given support insoles with metatarsal pad, encouraged to use Aspercreme daily, and encouraged to ice her foot as needed. The patient presents today for followup. She states that her foot pain is much improved. She has been using her support insoles in her running shoes. She's been using half length over-the-counter insoles in her running shoes. Additionally she has gone tissue marked it and purchased more comfortable casual shoes. She is using Aspercreme daily for the last week. She also ices her feet 5-6 times a week in the morning. She is playing tennis and walking for exercise 5 times a week, which is her baseline activity level. She states she is not limited in any way by pain or discomfort.   Review of Systems See history of present illness    Objective:   Physical Exam  BP 98/65  Pulse 80  Ht 5\' 4"  (1.626 m)  Wt 115 lb (52.164 kg)  BMI 19.73 kg/m2  LMP 08/12/2011  Gen. Thin middle-aged white female, pleasant and well-appearing Feet: bilateral pes cavus, no tenderness to palpation of the second metatarsal head or with metatarsal squeeze on the right-hand side, mild edema of the 2nd right distal metatarsal head Skin: warm and dry, no erythema or rashes of the feet      Assessment & Plan:

## 2013-02-22 NOTE — Assessment & Plan Note (Addendum)
Assessment: much improved right foot pain do to metatarsalgia of distal second metatarsal head and osteophyte development of MTP joint Plan: continue with sport insoles for running and tennis shoes. The patient was given another pair of sport insoles and metatarsal pads. She will call to schedule an appointment for custom orthotics when desired. Continue to use Aspercreme and ice as needed. Continue activity as tolerated

## 2013-04-05 ENCOUNTER — Ambulatory Visit: Payer: 59 | Admitting: Physical Therapy

## 2013-04-08 ENCOUNTER — Ambulatory Visit: Payer: 59 | Attending: Internal Medicine | Admitting: Physical Therapy

## 2013-04-08 DIAGNOSIS — M25559 Pain in unspecified hip: Secondary | ICD-10-CM | POA: Insufficient documentation

## 2013-04-08 DIAGNOSIS — IMO0001 Reserved for inherently not codable concepts without codable children: Secondary | ICD-10-CM | POA: Insufficient documentation

## 2013-04-08 DIAGNOSIS — R5381 Other malaise: Secondary | ICD-10-CM | POA: Insufficient documentation

## 2013-04-08 DIAGNOSIS — M6281 Muscle weakness (generalized): Secondary | ICD-10-CM | POA: Insufficient documentation

## 2013-04-13 ENCOUNTER — Ambulatory Visit: Payer: 59 | Admitting: Physical Therapy

## 2013-04-20 ENCOUNTER — Encounter: Payer: 59 | Admitting: Physical Therapy

## 2013-04-22 ENCOUNTER — Ambulatory Visit: Payer: 59 | Admitting: Physical Therapy

## 2013-04-26 ENCOUNTER — Ambulatory Visit: Payer: 59 | Admitting: Physical Therapy

## 2013-04-27 ENCOUNTER — Encounter: Payer: 59 | Admitting: Physical Therapy

## 2013-05-06 ENCOUNTER — Ambulatory Visit: Payer: 59 | Attending: Internal Medicine | Admitting: Physical Therapy

## 2013-05-06 DIAGNOSIS — M6281 Muscle weakness (generalized): Secondary | ICD-10-CM | POA: Insufficient documentation

## 2013-05-06 DIAGNOSIS — M25559 Pain in unspecified hip: Secondary | ICD-10-CM | POA: Insufficient documentation

## 2013-05-06 DIAGNOSIS — IMO0001 Reserved for inherently not codable concepts without codable children: Secondary | ICD-10-CM | POA: Insufficient documentation

## 2013-05-06 DIAGNOSIS — R5381 Other malaise: Secondary | ICD-10-CM | POA: Insufficient documentation

## 2013-05-18 ENCOUNTER — Ambulatory Visit: Payer: 59 | Admitting: Physical Therapy

## 2013-09-10 ENCOUNTER — Telehealth: Payer: Self-pay | Admitting: Nurse Practitioner

## 2013-09-10 NOTE — Telephone Encounter (Signed)
Xanax--patient requesting a refill.  [place]

## 2013-09-10 NOTE — Telephone Encounter (Signed)
Last AEX and refill 10/07/12 #30/1 refill Next appt 10/19/13  Patient states she doesn't need Rx this weekend 09/10/13 - FYI Please approve or deny Rx.

## 2013-09-13 MED ORDER — ALPRAZOLAM 0.25 MG PO TABS: 0.2500 mg | ORAL_TABLET | Freq: Every evening | ORAL | Status: DC | PRN

## 2013-09-13 NOTE — Telephone Encounter (Signed)
Rx faxed. Patient notified.

## 2013-09-13 NOTE — Telephone Encounter (Signed)
Rx done and printed.  Will fax to pharmacy today.  Please notify pt.

## 2013-09-13 NOTE — Telephone Encounter (Signed)
Dr Sabra Heck you saw her for AEX and gave her this refill - please look as this request and decide if you want to refill

## 2013-10-19 ENCOUNTER — Encounter: Payer: Self-pay | Admitting: Nurse Practitioner

## 2013-10-19 ENCOUNTER — Ambulatory Visit (INDEPENDENT_AMBULATORY_CARE_PROVIDER_SITE_OTHER): Payer: 59 | Admitting: Nurse Practitioner

## 2013-10-19 VITALS — BP 86/54 | HR 88 | Ht 64.25 in | Wt 115.0 lb

## 2013-10-19 DIAGNOSIS — M8580 Other specified disorders of bone density and structure, unspecified site: Secondary | ICD-10-CM

## 2013-10-19 DIAGNOSIS — M949 Disorder of cartilage, unspecified: Secondary | ICD-10-CM

## 2013-10-19 DIAGNOSIS — Z0000 Encounter for general adult medical examination without abnormal findings: Secondary | ICD-10-CM

## 2013-10-19 DIAGNOSIS — M899 Disorder of bone, unspecified: Secondary | ICD-10-CM

## 2013-10-19 DIAGNOSIS — Z01419 Encounter for gynecological examination (general) (routine) without abnormal findings: Secondary | ICD-10-CM

## 2013-10-19 LAB — POCT URINALYSIS DIPSTICK
BILIRUBIN UA: NEGATIVE
Blood, UA: NEGATIVE
GLUCOSE UA: NEGATIVE
Ketones, UA: NEGATIVE
LEUKOCYTES UA: NEGATIVE
NITRITE UA: NEGATIVE
PH UA: 7
Protein, UA: NEGATIVE
Urobilinogen, UA: NEGATIVE

## 2013-10-19 MED ORDER — ESTRADIOL 0.05 MG/24HR TD PTTW: 1.0000 | MEDICATED_PATCH | TRANSDERMAL | Status: DC

## 2013-10-19 MED ORDER — PROGESTERONE MICRONIZED 100 MG PO CAPS: 100.0000 mg | ORAL_CAPSULE | Freq: Every evening | ORAL | Status: DC

## 2013-10-19 MED ORDER — ALPRAZOLAM 0.25 MG PO TABS: 0.2500 mg | ORAL_TABLET | Freq: Every evening | ORAL | Status: DC | PRN

## 2013-10-19 NOTE — Patient Instructions (Signed)

## 2013-10-19 NOTE — Progress Notes (Signed)
Patient ID: Rachel Beasley, female   DOB: [birth year], 58 y.o.   MRN: [id number] 58 y.o. [id number] Married Caucasian Fe here for annual exam.  No vaso symptoms. Some vaginal dryness.  Patient's last menstrual period was 08/12/2011.          Sexually active: yes  The current method of family planning is post menopausal status.    Exercising: yes  walking, tennis and yoga 4-5 days per week Smoker:  no  Health Maintenance: Pap:  08/12/11 WNL/negative HR HPV MMG:  11/20/12, Bi-Rads 1: negative Colonoscopy:  2009, polyps, repeat in 5 years BMD:   07/20/2008; 0.3/-1.1 TDaP:  03/31/07 Labs:  HB:  13.3  Urine:  Negative Cholesterol 08/2011, normal      reports that she has never smoked. She has never used smokeless tobacco. She reports that she drinks alcohol. She reports that she does not use illicit drugs.  Past Medical History  Diagnosis Date  . Dyspareunia   . Endometriosis     Past Surgical History  Procedure Laterality Date  . Cesarean section  1991  . Laparoscopy  1989  . Ovarian cyst removal  1988    benign- endometrioma- laparatomy then danazol 6 months  . Spinal fusion  2006    C5-6 rupture    Current Outpatient Prescriptions  Medication Sig Dispense Refill  . ALPRAZolam (XANAX) 0.25 MG tablet Take 1 tablet (0.25 mg total) by mouth at bedtime as needed for sleep.  30 tablet  0  . Calcium Carbonate-Vitamin D (CALCIUM 500 + D PO) Take 1 tablet by mouth daily.      Marland Kitchen estradiol (MINIVELLE) 0.05 MG/24HR Place 1 patch (0.05 mg total) onto the skin 2 (two) times a week. Sunday and Thursday  24 patch  4  . ibuprofen (ADVIL,MOTRIN) 200 MG tablet Take 600 mg by mouth 3 (three) times daily as needed for pain.      . progesterone (PROMETRIUM) 100 MG capsule Take 1 capsule (100 mg total) by mouth every evening.  90 capsule  4  . Vitamin D, Ergocalciferol, (DRISDOL) 50000 UNITS CAPS Take 1 capsule (50,000 Units total) by mouth every 7 (seven) days. On Mondays  12 capsule  4   No current  facility-administered medications for this visit.    Family History  Problem Relation Age of Onset  . Diabetes Maternal Grandfather   . Heart disease Maternal Grandfather   . Polymyalgia rheumatica Father   . Heart disease Paternal Grandfather     ROS:  Pertinent items are noted in HPI.  Otherwise, a comprehensive ROS was negative.  Exam:   BP 86/54  Pulse 88  Ht 5' 4.25" (1.632 m)  Wt 115 lb (52.164 kg)  BMI 19.59 kg/m2  LMP 08/12/2011 Height: 5' 4.25" (163.2 cm)  Ht Readings from Last 3 Encounters:  10/19/13 5' 4.25" (1.632 m)  02/22/13 5\' 4"  (1.626 m)  01/25/13 5\' 4"  (1.626 m)    General appearance: alert, cooperative and appears stated age Head: Normocephalic, without obvious abnormality, atraumatic Neck: no adenopathy, supple, symmetrical, trachea midline and thyroid normal to inspection and palpation Lungs: clear to auscultation bilaterally Breasts: normal appearance, no masses or tenderness Heart: regular rate and rhythm Abdomen: soft, non-tender; no masses,  no organomegaly Extremities: extremities normal, atraumatic, no cyanosis or edema Skin: Skin color, texture, turgor normal. No rashes or lesions Lymph nodes: Cervical, supraclavicular, and axillary nodes normal. No abnormal inguinal nodes palpated Neurologic: Grossly normal   Pelvic: External genitalia:  no lesions  Urethra:  normal appearing urethra with no masses, tenderness or lesions              Bartholin's and Skene's: normal                 Vagina: normal appearing vagina with normal color and discharge, no lesions              Cervix: anteverted              Pap taken: no Bimanual Exam:  Uterus:  normal size, contour, position, consistency, mobility, non-tender              Adnexa: no mass, fullness, tenderness               Rectovaginal: Confirms               Anus:  normal sphincter tone, no lesions  A:  Well Woman with normal exam  Postmenopausal on HRT  P:   Reviewed health and  wellness pertinent to exam  Pap smear not taken today  Mammogram is due in June, will get Dexa  Refill on HRT  Counseled  with potential risk of DVT, CVA, cancer, etc  Refill on Xanax used for travel  Counseled on breast self exam, mammography screening, use and side effects of HRT, adequate intake of calcium and vitamin D, diet and exercise, Kegel's exercises return annually or prn  An After Visit Summary was printed and given to the patient.

## 2013-10-20 LAB — HEMOGLOBIN, FINGERSTICK: Hemoglobin, fingerstick: 13.3 g/dL (ref 12.0–16.0)

## 2013-10-25 NOTE — Progress Notes (Signed)
Encounter reviewed by Dr. Arrow Emmerich Silva.  

## 2013-11-01 ENCOUNTER — Other Ambulatory Visit (INDEPENDENT_AMBULATORY_CARE_PROVIDER_SITE_OTHER): Payer: 59

## 2013-11-01 DIAGNOSIS — Z0000 Encounter for general adult medical examination without abnormal findings: Secondary | ICD-10-CM

## 2013-11-01 LAB — COMPREHENSIVE METABOLIC PANEL
ALBUMIN: 4.2 g/dL (ref 3.5–5.2)
ALK PHOS: 57 U/L (ref 39–117)
ALT: 17 U/L (ref 0–35)
AST: 17 U/L (ref 0–37)
BUN: 17 mg/dL (ref 6–23)
CO2: 28 mEq/L (ref 19–32)
Calcium: 9.4 mg/dL (ref 8.4–10.5)
Chloride: 105 mEq/L (ref 96–112)
Creat: 0.93 mg/dL (ref 0.50–1.10)
GLUCOSE: 95 mg/dL (ref 70–99)
POTASSIUM: 4.3 meq/L (ref 3.5–5.3)
SODIUM: 139 meq/L (ref 135–145)
TOTAL PROTEIN: 6.3 g/dL (ref 6.0–8.3)
Total Bilirubin: 0.7 mg/dL (ref 0.2–1.2)

## 2013-11-01 LAB — CBC WITH DIFFERENTIAL/PLATELET
BASOS PCT: 2 % — AB (ref 0–1)
Basophils Absolute: 0.1 10*3/uL (ref 0.0–0.1)
EOS ABS: 0.1 10*3/uL (ref 0.0–0.7)
Eosinophils Relative: 4 % (ref 0–5)
HCT: 39.4 % (ref 36.0–46.0)
Hemoglobin: 13.3 g/dL (ref 12.0–15.0)
Lymphocytes Relative: 44 % (ref 12–46)
Lymphs Abs: 1.4 10*3/uL (ref 0.7–4.0)
MCH: 31.2 pg (ref 26.0–34.0)
MCHC: 33.8 g/dL (ref 30.0–36.0)
MCV: 92.5 fL (ref 78.0–100.0)
Monocytes Absolute: 0.4 10*3/uL (ref 0.1–1.0)
Monocytes Relative: 13 % — ABNORMAL HIGH (ref 3–12)
NEUTROS PCT: 37 % — AB (ref 43–77)
Neutro Abs: 1.1 10*3/uL — ABNORMAL LOW (ref 1.7–7.7)
PLATELETS: 182 10*3/uL (ref 150–400)
RBC: 4.26 MIL/uL (ref 3.87–5.11)
RDW: 13.2 % (ref 11.5–15.5)
WBC: 3.1 10*3/uL — ABNORMAL LOW (ref 4.0–10.5)

## 2013-11-01 LAB — LIPID PANEL
Cholesterol: 148 mg/dL (ref 0–200)
HDL: 64 mg/dL (ref >39)
LDL CALC: 74 mg/dL (ref 0–99)
TRIGLYCERIDES: 49 mg/dL (ref <150)
Total CHOL/HDL Ratio: 2.3 Ratio
VLDL: 10 mg/dL (ref 0–40)

## 2013-11-01 LAB — TSH: TSH: 1.34 u[IU]/mL (ref 0.350–4.500)

## 2013-11-02 LAB — VITAMIN D 25 HYDROXY (VIT D DEFICIENCY, FRACTURES): Vit D, 25-Hydroxy: 43 ng/mL (ref 30–89)

## 2013-11-07 ENCOUNTER — Telehealth: Payer: Self-pay | Admitting: Infectious Disease

## 2013-11-07 MED ORDER — LEVOFLOXACIN 750 MG PO TABS: 750.0000 mg | ORAL_TABLET | Freq: Every day | ORAL | Status: DC

## 2013-11-07 MED ORDER — SULFAMETHOXAZOLE-TMP DS 800-160 MG PO TABS: 1.0000 | ORAL_TABLET | Freq: Two times a day (BID) | ORAL | Status: DC

## 2013-11-07 NOTE — Telephone Encounter (Signed)
Rachel Beasley is coming at 845 am at which time I am already overbooked. She is bran new patient for me. Can I please close the 1030 spot to make up for this 3 overbook with  A new pt

## 2013-11-07 NOTE — Telephone Encounter (Signed)
Called by husband Jolyn Nap [facility]  His wife awoke with erythema near nose under eyelid that extended upwards and has a peri-orbital cellulitis  Seen this am at [facility] rx bactrim DS one bid and keflex 500 tid  I will broaden and intensify her abx   To Levaquin 750mg  daily  And TWO DS bactrim tablets BID  x10 days  I would like to overbook her in clinic tomorrow to make sure she is doing ok

## 2013-11-08 ENCOUNTER — Ambulatory Visit (INDEPENDENT_AMBULATORY_CARE_PROVIDER_SITE_OTHER): Payer: 59 | Admitting: Infectious Disease

## 2013-11-08 ENCOUNTER — Other Ambulatory Visit: Payer: Self-pay | Admitting: *Deleted

## 2013-11-08 ENCOUNTER — Encounter: Payer: Self-pay | Admitting: Infectious Disease

## 2013-11-08 VITALS — BP 93/63 | HR 71 | Temp 98.1°F | Ht 64.0 in | Wt 115.2 lb

## 2013-11-08 DIAGNOSIS — H05019 Cellulitis of unspecified orbit: Secondary | ICD-10-CM

## 2013-11-08 DIAGNOSIS — L237 Allergic contact dermatitis due to plants, except food: Secondary | ICD-10-CM

## 2013-11-08 DIAGNOSIS — L255 Unspecified contact dermatitis due to plants, except food: Secondary | ICD-10-CM

## 2013-11-08 MED ORDER — TRIAMCINOLONE ACETONIDE 0.5 % EX OINT: 1.0000 "application " | TOPICAL_OINTMENT | Freq: Two times a day (BID) | CUTANEOUS | Status: DC

## 2013-11-08 MED ORDER — SULFAMETHOXAZOLE-TMP DS 800-160 MG PO TABS: 1.0000 | ORAL_TABLET | Freq: Two times a day (BID) | ORAL | Status: DC

## 2013-11-08 MED ORDER — SULFAMETHOXAZOLE-TMP DS 800-160 MG PO TABS: 2.0000 | ORAL_TABLET | Freq: Two times a day (BID) | ORAL | Status: DC

## 2013-11-08 NOTE — Patient Instructions (Signed)
Take the Bactrim DS two tablets twice dailiy with plenty of fluids  Try the topical steroid on part of nose and if that works apply to entire area

## 2013-11-08 NOTE — Progress Notes (Signed)
Subjective:    Patient ID: Rachel Beasley, female    DOB: [birth year], 58 y.o.   MRN: [id number]  HPI  58 year old lady previously healthy lady with sudden onset of erythema and edema in her right eyelids, nose. She was seen at [facility] and concern was for orbital cellulitis. She was givne rx for Keflex and bactrim. Her husband Rachel Nap, MD with [facility] had called me with concerns regarding severity of the infection and how soon one should check imaging for more severe consequences of unchecked periorbital cellulitis. I felt the antibiotic choices were reasonable but offered to go more broad with antipseudomonal coverage and more conveneint QD dosing with Levaquin 750 daily (in place of QID keflex) and with increased Bactrim DS dose to TWO tablets BID. Patient actually was not able to pick up the abx as pharmacy had closed by the time they had gone by to pickup meds. She has had stable erythema overnight on keflex and bactrim. Since then she has also noticed several areas of what she believes are reaction to poison ivy with erythema and pruritis on her left arm, and her neck and back. Her erythema in eyelid on right and part of nose is also intensely pruritic.   Review of Systems  Constitutional: Negative for fever, chills, diaphoresis, activity change, appetite change, fatigue and unexpected weight change.  HENT: Negative for congestion, rhinorrhea, sinus pressure, sneezing and sore throat.   Eyes: Negative for photophobia, pain, discharge, itching and visual disturbance.  Respiratory: Negative for cough and wheezing.   Gastrointestinal: Negative for nausea and vomiting.  Skin: Positive for color change and rash.  Neurological: Negative for dizziness and light-headedness.  Psychiatric/Behavioral: Negative for behavioral problems and agitation.       Objective:   Physical Exam  Constitutional: She is oriented to person, place, and time. She appears well-developed and  well-nourished. No distress.  HENT:  Head: Normocephalic and atraumatic.  Eyes: Conjunctivae and EOM are normal. Right eye exhibits no discharge. Left eye exhibits no discharge. No scleral icterus.  Neck: Normal range of motion. Neck supple.  Cardiovascular: Normal rate and regular rhythm.   Pulmonary/Chest: Effort normal. She has no wheezes.  Abdominal: She exhibits no distension.  Musculoskeletal: She exhibits no edema.  Neurological: She is alert and oriented to person, place, and time.  Skin: Rash noted. She is not diaphoretic. There is erythema.  Psychiatric: She has a normal mood and affect. Her behavior is normal. Judgment and thought content normal.   Skin: erythema of both eyelids R >> L ala of nose on right side.             Erythema of forearm where she believes she has poison ivy           Assessment & Plan:   #1 Possible Orbital cellulitis: If this is indeed cellulitis then I think covering aggressively for MRSA< MSSA is highest priority. Bactrim would provide coverage for these organims at TWO tabs BID and some strep coverage. I am starting to believe as the patient does that this could in fact rather be a contact dermatitis from poison ivy  Therefore will also give topical triamicinolone of medium potency that she can apply to part of the area of rash near eyelids in test area for example ala of nose and if has good response then apply to the other areas  I have refilled her bactrim DS for TWO BID and 10 day course  #  2 Poison ivy: see above discussion.

## 2013-11-10 ENCOUNTER — Telehealth: Payer: Self-pay | Admitting: *Deleted

## 2013-11-10 NOTE — Telephone Encounter (Signed)
If she is still taking TWO DS Bactrim BID, I would say drop it to ONE DS Bactrim BID  She is not taking keflex anymore correct?  I think she should use the cream on entire rash now and if it keeps getting better she can drop the abx completely

## 2013-11-10 NOTE — Telephone Encounter (Signed)
Patient called to advise that the medication is making her feel bad. She advised she has a constant stomach ache, and some body pain. Patient reports no other effects from the medication and that her rash is better since she started using the cream. She advised she understands the need for the medication but was wondering if she could adjust the dosage to possibly help with the stomach upset. Advised her will have to ask her doctor and give her a call back.

## 2013-11-10 NOTE — Telephone Encounter (Signed)
Called the patient and advised her to cut back fro 2 tabs 2x daily to 1 tab 2x daily and only take until the rash is gone. And to call the office if she does not feel any better in a few days.

## 2013-11-15 ENCOUNTER — Ambulatory Visit: Payer: 59 | Admitting: Infectious Disease

## 2013-11-22 ENCOUNTER — Other Ambulatory Visit: Payer: Self-pay

## 2013-11-22 DIAGNOSIS — Z1231 Encounter for screening mammogram for malignant neoplasm of breast: Secondary | ICD-10-CM

## 2013-11-26 ENCOUNTER — Ambulatory Visit
Admission: RE | Admit: 2013-11-26 | Discharge: 2013-11-26 | Disposition: A | Discharge status: Home / Self Care | Payer: 59 | Source: Ambulatory Visit | Attending: Nurse Practitioner | Admitting: Nurse Practitioner

## 2013-11-26 ENCOUNTER — Ambulatory Visit
Admission: RE | Admit: 2013-11-26 | Discharge: 2013-11-26 | Disposition: A | Discharge status: Home / Self Care | Payer: 59 | Source: Ambulatory Visit

## 2013-11-26 DIAGNOSIS — M8580 Other specified disorders of bone density and structure, unspecified site: Secondary | ICD-10-CM

## 2013-11-26 DIAGNOSIS — Z1231 Encounter for screening mammogram for malignant neoplasm of breast: Secondary | ICD-10-CM

## 2013-11-29 ENCOUNTER — Other Ambulatory Visit: Payer: Self-pay | Admitting: Nurse Practitioner

## 2013-11-29 ENCOUNTER — Other Ambulatory Visit (INDEPENDENT_AMBULATORY_CARE_PROVIDER_SITE_OTHER): Payer: 59

## 2013-11-29 DIAGNOSIS — Z0000 Encounter for general adult medical examination without abnormal findings: Secondary | ICD-10-CM

## 2013-11-29 DIAGNOSIS — D72819 Decreased white blood cell count, unspecified: Secondary | ICD-10-CM

## 2013-11-30 LAB — CBC WITH DIFFERENTIAL/PLATELET
BASOS ABS: 0 10*3/uL (ref 0.0–0.1)
Eosinophils Absolute: 0 10*3/uL (ref 0.0–0.7)
HCT: 36.6 % (ref 36.0–46.0)
HEMOGLOBIN: 12.6 g/dL (ref 12.0–15.0)
Lymphocytes Relative: 40 % (ref 12–46)
Lymphs Abs: 1.6 10*3/uL (ref 0.7–4.0)
MCH: 33.1 pg (ref 26.0–34.0)
MCHC: 34.4 g/dL (ref 30.0–36.0)
MCV: 96 fL (ref 78.0–100.0)
Monocytes Absolute: 0.2 10*3/uL (ref 0.1–1.0)
Monocytes Relative: 4 % (ref 3–12)
NEUTROS ABS: 2.3 10*3/uL (ref 1.7–7.7)
Neutrophils Relative %: 56 % (ref 43–77)
PLATELETS: 151 10*3/uL (ref 150–400)
RBC: 3.81 MIL/uL — ABNORMAL LOW (ref 3.87–5.11)
RDW: 12.5 % (ref 11.5–15.5)
WBC: 4.1 10*3/uL (ref 4.0–10.5)

## 2013-12-01 ENCOUNTER — Telehealth: Payer: Self-pay

## 2013-12-01 NOTE — Telephone Encounter (Signed)
lmom to call office back

## 2013-12-01 NOTE — Telephone Encounter (Signed)
Message copied by Gerda Diss on Wed Dec 01, 2013  9:19 AM ------      Message from: Antonietta Barcelona      Created: Tue Nov 30, 2013  5:57 PM       Results via my chart:            Rachel Beasley, your WBC - white blood count is much better - went from 3.1 to 4.1.  Good news no need for concern. ------

## 2013-12-01 NOTE — Telephone Encounter (Signed)
Informed pt of her lab results  Encounter closed

## 2013-12-27 ENCOUNTER — Other Ambulatory Visit: Payer: Self-pay | Admitting: Obstetrics & Gynecology

## 2013-12-27 NOTE — Telephone Encounter (Signed)
Last AEX: 10/19/13 Last refill:10/07/12 #12, 4rf Current AEX:10/24/14 Last Vitamin D: 11/01/13 43  Please advise

## 2014-04-04 ENCOUNTER — Encounter: Payer: Self-pay | Admitting: Infectious Disease

## 2014-10-24 ENCOUNTER — Ambulatory Visit: Payer: 59 | Admitting: Nurse Practitioner

## 2014-11-11 ENCOUNTER — Other Ambulatory Visit: Payer: Self-pay | Admitting: *Deleted

## 2014-11-11 MED ORDER — ESTRADIOL 0.05 MG/24HR TD PTTW: 1.0000 | MEDICATED_PATCH | TRANSDERMAL | Status: DC

## 2014-11-11 NOTE — Telephone Encounter (Signed)
I called patient and she said she is not due until the end of this month and she is set up for one then. (her last MM was 11-26-13) -eh

## 2014-11-11 NOTE — Telephone Encounter (Signed)
Mammogram is not current need current for refill

## 2014-11-11 NOTE — Telephone Encounter (Signed)
Medication refill request: Minivelle Patch Last AEX:  10-19-13  Next AEX: 01-11-15  Last MMG (if hormonal medication request): 11-26-13 WNL  Refill authorized: please advise

## 2014-11-11 NOTE — Telephone Encounter (Signed)
One refill given

## 2014-11-17 ENCOUNTER — Other Ambulatory Visit: Payer: Self-pay

## 2014-11-17 DIAGNOSIS — Z1231 Encounter for screening mammogram for malignant neoplasm of breast: Secondary | ICD-10-CM

## 2014-11-29 ENCOUNTER — Ambulatory Visit
Admission: RE | Admit: 2014-11-29 | Discharge: 2014-11-29 | Disposition: A | Discharge status: Home / Self Care | Payer: 59 | Source: Ambulatory Visit

## 2014-11-29 DIAGNOSIS — Z1231 Encounter for screening mammogram for malignant neoplasm of breast: Secondary | ICD-10-CM

## 2014-12-19 ENCOUNTER — Other Ambulatory Visit: Payer: Self-pay | Admitting: Nurse Practitioner

## 2014-12-19 NOTE — Telephone Encounter (Signed)
Medication refill request: Prometrium 100mg  Last AEX:  10-19-13  Next AEX: 01-11-15 Last MMG (if hormonal medication request): 11-30-14 WNL Refill authorized: Please Advise

## 2015-01-11 ENCOUNTER — Ambulatory Visit: Payer: 59 | Admitting: Nurse Practitioner

## 2015-01-16 ENCOUNTER — Encounter: Payer: Self-pay | Admitting: Nurse Practitioner

## 2015-01-16 ENCOUNTER — Ambulatory Visit (INDEPENDENT_AMBULATORY_CARE_PROVIDER_SITE_OTHER): Payer: 59 | Admitting: Nurse Practitioner

## 2015-01-16 VITALS — BP 108/66 | HR 80 | Ht 64.25 in | Wt 111.0 lb

## 2015-01-16 DIAGNOSIS — Z0000 Encounter for general adult medical examination without abnormal findings: Secondary | ICD-10-CM

## 2015-01-16 DIAGNOSIS — E559 Vitamin D deficiency, unspecified: Secondary | ICD-10-CM

## 2015-01-16 DIAGNOSIS — M8580 Other specified disorders of bone density and structure, unspecified site: Secondary | ICD-10-CM

## 2015-01-16 DIAGNOSIS — Z01419 Encounter for gynecological examination (general) (routine) without abnormal findings: Secondary | ICD-10-CM

## 2015-01-16 DIAGNOSIS — Z79890 Hormone replacement therapy (postmenopausal): Secondary | ICD-10-CM

## 2015-01-16 LAB — POCT URINALYSIS DIPSTICK
Bilirubin, UA: NEGATIVE
Blood, UA: NEGATIVE
Glucose, UA: NEGATIVE
Ketones, UA: NEGATIVE
LEUKOCYTES UA: NEGATIVE
NITRITE UA: NEGATIVE
PH UA: 7
PROTEIN UA: NEGATIVE
UROBILINOGEN UA: NEGATIVE

## 2015-01-16 MED ORDER — ESTRADIOL 0.05 MG/24HR TD PTTW: 1.0000 | MEDICATED_PATCH | TRANSDERMAL | Status: DC

## 2015-01-16 MED ORDER — ALPRAZOLAM 0.25 MG PO TABS: 0.2500 mg | ORAL_TABLET | Freq: Every evening | ORAL | Status: DC | PRN

## 2015-01-16 MED ORDER — PROGESTERONE MICRONIZED 100 MG PO CAPS: ORAL_CAPSULE | ORAL | Status: DC

## 2015-01-16 MED ORDER — VITAMIN D (ERGOCALCIFEROL) 1.25 MG (50000 UNIT) PO CAPS: ORAL_CAPSULE | ORAL | Status: DC

## 2015-01-16 NOTE — Patient Instructions (Signed)

## 2015-01-16 NOTE — Progress Notes (Signed)
Patient ID: Rachel Beasley, female   DOB: 1956/01/12, 59 y.o.   MRN: [id number] 59 y.o. [id number] Married  Caucasian Fe here for annual exam. No new health problems. Continues in HRT and doing well.  Would like to continue.   Will be going on bike tour in [place] this fall.  All 3 kids now graduated and working.   Patient's last menstrual period was 08/12/2011 (exact date).          Sexually active: Yes.    The current method of family planning is post menopausal status.    Exercising: Yes.   Walking 2-3 times per week, tennis 2 times per week, biking.  Usually exercises total of 4-5 times per week Smoker:  no  Health Maintenance: Pap:  08/12/11, Negative with negative HR HPV MMG:  11/29/14, Bi-Rads 1: negative Colonoscopy:  2009, polyps, repeat in 5 years BMD:  11/26/13, T Score 0.7 S/-1.3 L; FRAX not calculated due to HRT TDaP:  03/31/07 Labs:  Declined  Urine:  Negative    reports that she has never smoked. She has never used smokeless tobacco. She reports that she drinks alcohol. She reports that she does not use illicit drugs.  Past Medical History  Diagnosis Date  . Dyspareunia   . Endometriosis     Past Surgical History  Procedure Laterality Date  . Cesarean section  1991  . Laparoscopy  1989  . Ovarian cyst removal  1988    benign- endometrioma- laparatomy then danazol 6 months  . Spinal fusion  2006    C5-6 rupture    Current Outpatient Prescriptions  Medication Sig Dispense Refill  . ALPRAZolam (XANAX) 0.25 MG tablet Take 1 tablet (0.25 mg total) by mouth at bedtime as needed for sleep. 30 tablet 2  . Calcium Carbonate-Vitamin D (CALCIUM 500 + D PO) Take 1 tablet by mouth daily.    Marland Kitchen estradiol (MINIVELLE) 0.05 MG/24HR patch Place 1 patch (0.05 mg total) onto the skin 2 (two) times a week. Sunday and Thursday 24 patch 3  . ibuprofen (ADVIL,MOTRIN) 200 MG tablet Take 600 mg by mouth 3 (three) times daily as needed for pain.    . progesterone (PROMETRIUM) 100 MG capsule TAKE 1  CAPSULE (100 MG) BY MOUTH EVERY EVENING. 90 capsule 3  . Vitamin D, Ergocalciferol, (DRISDOL) 50000 UNITS CAPS capsule TAKE 1 CAPSULE BY MOUTH EVERY 14 DAYS ON MONDAYS 30 capsule 3   No current facility-administered medications for this visit.    Family History  Problem Relation Age of Onset  . Diabetes Maternal Grandfather   . Heart disease Maternal Grandfather   . Polymyalgia rheumatica Father   . Renal Disease Father   . Hypertension Father   . Stroke Father   . Heart disease Paternal Grandfather   . Stroke Mother   . Hypertension Mother   . Hyperlipidemia Mother     ROS:  Pertinent items are noted in HPI.  Otherwise, a comprehensive ROS was negative.  Exam:   BP 108/66 mmHg  Pulse 80  Ht 5' 4.25" (1.632 m)  Wt 111 lb (50.349 kg)  BMI 18.90 kg/m2  LMP 08/12/2011 (Exact Date) Height: 5' 4.25" (163.2 cm) Ht Readings from Last 3 Encounters:  01/16/15 5' 4.25" (1.632 m)  11/08/13 5\' 4"  (1.626 m)  10/19/13 5' 4.25" (1.632 m)    General appearance: alert, cooperative and appears stated age Head: Normocephalic, without obvious abnormality, atraumatic Neck: no adenopathy, supple, symmetrical, trachea midline and thyroid normal to inspection and  palpation Lungs: clear to auscultation bilaterally Breasts: normal appearance, no masses or tenderness Heart: regular rate and rhythm Abdomen: soft, non-tender; no masses,  no organomegaly Extremities: extremities normal, atraumatic, no cyanosis or edema Skin: Skin color, texture, turgor normal. No rashes or lesions Lymph nodes: Cervical, supraclavicular, and axillary nodes normal. No abnormal inguinal nodes palpated Neurologic: Grossly normal   Pelvic: External genitalia:  no lesions              Urethra:  normal appearing urethra with no masses, tenderness or lesions              Bartholin's and Skene's: normal                 Vagina: normal appearing vagina with normal color and discharge, no lesions              Cervix:  anteverted              Pap taken: Yes.   Bimanual Exam:  Uterus:  normal size, contour, position, consistency, mobility, non-tender              Adnexa: no mass, fullness, tenderness               Rectovaginal: Confirms               Anus:  normal sphincter tone, no lesions  Chaperone present:  yes  A:  Well Woman with normal exam  Postmenopausal on HRT since 08/15/2006  Vit D deficiency and osteopenia  Insomnia with occasional use of Xanax  P:   Reviewed health and wellness pertinent to exam  Pap smear as above  Mammogram is due 6/17  Refill on Minivelle 0.05 mg and Prometrium for a year  Counseled with risk of CVA, DVT, cancer, and normal time of HRT.  Will refill Vit D and follow with labs  Refill Xanax 0.25 mg that she normally only uses with travel. # 30 /3  Counseled on breast self exam, mammography screening, use and side effects of HRT, adequate intake of calcium and vitamin D, diet and exercise return annually or prn  An After Visit Summary was printed and given to the patient.

## 2015-01-17 LAB — VITAMIN D 25 HYDROXY (VIT D DEFICIENCY, FRACTURES): Vit D, 25-Hydroxy: 33 ng/mL (ref 30–100)

## 2015-01-17 NOTE — Progress Notes (Signed)
Encounter reviewed by Dr. Brook Amundson C. Silva.  

## 2015-01-19 LAB — IPS PAP TEST WITH HPV

## 2015-06-07 DIAGNOSIS — Z0000 Encounter for general adult medical examination without abnormal findings: Secondary | ICD-10-CM | POA: Diagnosis not present

## 2015-06-07 DIAGNOSIS — Z91030 Bee allergy status: Secondary | ICD-10-CM | POA: Diagnosis not present

## 2015-06-07 MED FILL — EPIPEN 2-PAK 0.3 MG AUTO-IN: 0.3 | 30 days supply | Qty: 2 | Fill #0

## 2015-06-07 MED FILL — VIT D2 1.25 MG (50,000 UNIT: 1.25 MG | 84 days supply | Qty: 6 | Fill #1

## 2015-06-12 MED FILL — ESTRADIOL 0.05 MG PATCH: 0.05 | 84 days supply | Qty: 24 | Fill #1

## 2015-06-30 MED FILL — PROGESTERONE 100 MG CAPSULE: 100 | 90 days supply | Qty: 90 | Fill #1

## 2015-07-03 DIAGNOSIS — H524 Presbyopia: Secondary | ICD-10-CM | POA: Diagnosis not present

## 2015-07-03 DIAGNOSIS — H5203 Hypermetropia, bilateral: Secondary | ICD-10-CM | POA: Diagnosis not present

## 2015-08-28 ENCOUNTER — Other Ambulatory Visit: Payer: Self-pay | Admitting: Nurse Practitioner

## 2015-08-28 MED ORDER — ALPRAZOLAM 0.25 MG PO TABS: 0.2500 mg | ORAL_TABLET | Freq: Every evening | ORAL | Status: DC | PRN

## 2015-08-28 MED FILL — ALPRAZolam 0.25 MG TABS: 0.25 | 30 days supply | Qty: 30 | Fill #0

## 2015-08-28 NOTE — Telephone Encounter (Signed)
Patient called requesting a refill on Xanax be sent to her local pharmacy on file.

## 2015-08-28 NOTE — Telephone Encounter (Addendum)
RX printed signed by Dr. Sabra Heck and faxed to [facility], patient is aware.

## 2015-08-28 NOTE — Telephone Encounter (Signed)
Medication refill request: Xanax Last AEX:  01-16-15 Next AEX: 01-17-16 Last MMG (if hormonal medication request): 11-30-14 WNL  Refill authorized: please advise Sending to [facility] since [facility] is out of the office

## 2015-09-21 MED FILL — ESTRADIOL 0.05 MG PATCH: 0.05 | 84 days supply | Qty: 24 | Fill #2

## 2015-09-28 MED FILL — PROGESTERONE 100 MG CAPSULE: 100 | 90 days supply | Qty: 90 | Fill #2

## 2015-11-06 ENCOUNTER — Other Ambulatory Visit: Payer: Self-pay | Admitting: Obstetrics & Gynecology

## 2015-11-06 DIAGNOSIS — Z1231 Encounter for screening mammogram for malignant neoplasm of breast: Secondary | ICD-10-CM

## 2015-12-11 ENCOUNTER — Ambulatory Visit: Payer: 59

## 2015-12-20 MED FILL — VIT D2 1.25 MG (50,000 UNIT: 1.25 MG | 84 days supply | Qty: 6 | Fill #2

## 2015-12-21 MED FILL — ESTRADIOL 0.05 MG PATCH: 0.05 | 84 days supply | Qty: 24 | Fill #3

## 2015-12-28 MED FILL — PROGESTERONE 100 MG CAPSULE: 100 | 90 days supply | Qty: 90 | Fill #3

## 2016-01-02 ENCOUNTER — Ambulatory Visit: Payer: 59

## 2016-01-04 ENCOUNTER — Ambulatory Visit
Admission: RE | Admit: 2016-01-04 | Discharge: 2016-01-04 | Disposition: A | Discharge status: Home / Self Care | Payer: 59 | Source: Ambulatory Visit | Attending: Obstetrics & Gynecology | Admitting: Obstetrics & Gynecology

## 2016-01-04 DIAGNOSIS — Z1231 Encounter for screening mammogram for malignant neoplasm of breast: Secondary | ICD-10-CM | POA: Diagnosis not present

## 2016-01-17 ENCOUNTER — Encounter: Payer: Self-pay | Admitting: Nurse Practitioner

## 2016-01-17 ENCOUNTER — Ambulatory Visit (INDEPENDENT_AMBULATORY_CARE_PROVIDER_SITE_OTHER): Payer: 59 | Admitting: Nurse Practitioner

## 2016-01-17 VITALS — BP 100/58 | HR 56 | Ht 64.0 in | Wt 112.0 lb

## 2016-01-17 DIAGNOSIS — E559 Vitamin D deficiency, unspecified: Secondary | ICD-10-CM

## 2016-01-17 DIAGNOSIS — Z0000 Encounter for general adult medical examination without abnormal findings: Secondary | ICD-10-CM

## 2016-01-17 DIAGNOSIS — E2839 Other primary ovarian failure: Secondary | ICD-10-CM | POA: Diagnosis not present

## 2016-01-17 DIAGNOSIS — Z01419 Encounter for gynecological examination (general) (routine) without abnormal findings: Secondary | ICD-10-CM | POA: Diagnosis not present

## 2016-01-17 LAB — POCT URINALYSIS DIPSTICK
Bilirubin, UA: NEGATIVE
Glucose, UA: NEGATIVE
Ketones, UA: NEGATIVE
Leukocytes, UA: NEGATIVE
Nitrite, UA: NEGATIVE
PH UA: 7
PROTEIN UA: NEGATIVE
RBC UA: NEGATIVE
UROBILINOGEN UA: NEGATIVE

## 2016-01-17 MED ORDER — PROGESTERONE MICRONIZED 100 MG PO CAPS: ORAL_CAPSULE | ORAL | 3 refills | Status: DC

## 2016-01-17 MED ORDER — ESTRADIOL 0.0375 MG/24HR TD PTTW: 1.0000 | MEDICATED_PATCH | TRANSDERMAL | 12 refills | Status: DC

## 2016-01-17 MED ORDER — ALPRAZOLAM 0.25 MG PO TABS: 0.2500 mg | ORAL_TABLET | Freq: Every evening | ORAL | 1 refills | Status: DC | PRN

## 2016-01-17 MED ORDER — VITAMIN D (ERGOCALCIFEROL) 1.25 MG (50000 UNIT) PO CAPS: ORAL_CAPSULE | ORAL | 3 refills | Status: DC

## 2016-01-17 NOTE — Patient Instructions (Signed)

## 2016-01-17 NOTE — Progress Notes (Signed)
Patient ID: Rachel Beasley, female   DOB: 04/07/1956, 60 y.o.   MRN: [id number]  60 y.o. G34P0103 Married  Caucasian Fe here for annual exam.  She feels well and wants to stay on HRT.  She is OK to try a lower dose.  Some vaginal dryness.   Husband Dr. Caryl Comes is working toward taking classes on Medical Ethics and is hoping to teach in that direction.  He has reduced his cardiology practice by 9 %.  Patient's last menstrual period was 08/12/2011 (exact date).          Sexually active: Yes.    The current method of family planning is post menopausal status.    Exercising: Yes. Walking 2-3 times per week, tennis 2 times per week, pushups.  Usually exercises total of 4-5 times per week Smoker:  no  Health Maintenance: Pap: 01/16/15, Negative with negative HR HPV MMG: 01/04/16, Bi-Rads 1: negative Colonoscopy:  2009, polyps, repeat in 5 years BMD:  11/26/13, T Score 0.7 Spine / -1.3 Left Femur Neck; FRAX not calculated due to HRT TDaP:  03/31/07 Shingles: Never Pneumonia: Not indicated due to age Hep C: done today Labs: PCP takes care of labs, we follow Vitamin D  Urine: Negative    reports that she has never smoked. She has never used smokeless tobacco. She reports that she drinks alcohol. She reports that she does not use drugs.  Past Medical History:  Diagnosis Date  . Dyspareunia   . Endometriosis     Past Surgical History:  Procedure Laterality Date  . CESAREAN SECTION  1991  . LAPAROSCOPY  1989  . OVARIAN CYST REMOVAL  1988   benign- endometrioma- laparatomy then danazol 6 months  . SPINAL FUSION  2006   C5-6 rupture    Current Outpatient Prescriptions  Medication Sig Dispense Refill  . ALPRAZolam (XANAX) 0.25 MG tablet Take 1 tablet (0.25 mg total) by mouth at bedtime as needed for sleep. 30 tablet 0  . Calcium Carbonate-Vitamin D (CALCIUM 500 + D PO) Take 1 tablet by mouth daily.    Marland Kitchen estradiol (MINIVELLE) 0.05 MG/24HR patch Place 1 patch (0.05 mg total) onto the skin 2 (two)  times a week. Sunday and Thursday 24 patch 3  . ibuprofen (ADVIL,MOTRIN) 200 MG tablet Take 600 mg by mouth 3 (three) times daily as needed for pain.    . progesterone (PROMETRIUM) 100 MG capsule TAKE 1 CAPSULE (100 MG) BY MOUTH EVERY EVENING. 90 capsule 3  . Vitamin D, Ergocalciferol, (DRISDOL) 50000 UNITS CAPS capsule TAKE 1 CAPSULE BY MOUTH EVERY 14 DAYS ON MONDAYS 30 capsule 3   No current facility-administered medications for this visit.     Family History  Problem Relation Age of Onset  . Diabetes Maternal Grandfather   . Heart disease Maternal Grandfather   . Polymyalgia rheumatica Father   . Renal Disease Father   . Hypertension Father   . Stroke Father   . Heart disease Paternal Grandfather   . Stroke Mother   . Hypertension Mother   . Hyperlipidemia Mother     ROS:  Pertinent items are noted in HPI.  Otherwise, a comprehensive ROS was negative.  Exam:   LMP 08/12/2011 (Exact Date)    Ht Readings from Last 3 Encounters:  01/16/15 5' 4.25" (1.632 m)  11/08/13 5\' 4"  (1.626 m)  10/19/13 5' 4.25" (1.632 m)    General appearance: alert, cooperative and appears stated age Head: Normocephalic, without obvious abnormality, atraumatic Neck: no adenopathy,  supple, symmetrical, trachea midline and thyroid normal to inspection and palpation Lungs: clear to auscultation bilaterally Breasts: normal appearance, no masses or tenderness Heart: regular rate and rhythm Abdomen: soft, non-tender; no masses,  no organomegaly Extremities: extremities normal, atraumatic, no cyanosis or edema Skin: Skin color, texture, turgor normal. No rashes or lesions Lymph nodes: Cervical, supraclavicular, and axillary nodes normal. No abnormal inguinal nodes palpated Neurologic: Grossly normal   Pelvic: External genitalia:  no lesions              Urethra:  normal appearing urethra with no masses, tenderness or lesions              Bartholin's and Skene's: normal                 Vagina: normal  appearing vagina with normal color and discharge, no lesions              Cervix: anteverted              Pap taken: No. Bimanual Exam:  Uterus:  normal size, contour, position, consistency, mobility, non-tender              Adnexa: no mass, fullness, tenderness               Rectovaginal: Confirms               Anus:  normal sphincter tone, no lesions  Chaperone present: yes  A:  Well Woman with normal exam  Postmenopausal on HRT since 08/15/2006             Vit D deficiency and osteopenia              Insomnia with occasional use of Xanax with travel  Low normal BMD with BMI of 19.22    P:   Reviewed health and wellness pertinent to exam  Pap smear as above  Mammogram is due 01/2017  Will change Minivelle to 0.0375 mg - she will alternate doses until the fall then see how she feels, then continue with 0.0375 if tolerable.  Refill on Prometrium 100 mg daily  Counseled with risk of DVT, CVA, cancer, etc.  Refill on Xanax 0.25 mg to use only prn.  Refill on Vit d and follow with labs  Follow with labs  Considering a change in PCP to internal med - [facility]  Counseled on breast self exam, mammography screening, use and side effects of HRT, adequate intake of calcium and vitamin D, diet and exercise return annually or prn  An After Visit Summary was printed and given to the patient.

## 2016-01-18 LAB — VITAMIN D 25 HYDROXY (VIT D DEFICIENCY, FRACTURES): VIT D 25 HYDROXY: 32 ng/mL (ref 30–100)

## 2016-01-18 LAB — HEPATITIS C ANTIBODY: HCV Ab: NEGATIVE

## 2016-01-19 NOTE — Progress Notes (Signed)
Reviewed personally.  M. Suzanne Emberli Ballester, MD.  

## 2016-03-22 ENCOUNTER — Other Ambulatory Visit: Payer: Self-pay | Admitting: Nurse Practitioner

## 2016-03-22 NOTE — Telephone Encounter (Signed)
Medication refill request: Vivelle  Last AEX:  01/17/16 PG Next AEX: 01/20/17 PG Last MMG (if hormonal medication request): 01/05/16 BIRADS1:neg  Refill authorized: 01/18/16 #8patch / Donetta Potts to [facility]

## 2016-03-26 MED FILL — ESTRADIOL PATCH 0.0375: 0.0375 | 28 days supply | Qty: 8 | Fill #0

## 2016-04-01 ENCOUNTER — Telehealth: Payer: Self-pay | Admitting: Nurse Practitioner

## 2016-04-01 NOTE — Telephone Encounter (Signed)
Patient called requesting refills on her progesterone to last until her next AEX. She said the pharmacy on file told her she does not have any refills left.

## 2016-04-01 NOTE — Telephone Encounter (Signed)
Medication refill request: PROGESTERONE 100mg  Last AEX:  01/17/16 PG Next AEX: 01/20/17 Last MMG (if hormonal medication request): 01/04/16 BIRADS1 negative Refill authorized: 01/17/16 #90 w/3 refills;   Will call pharmacy to verify refills. Patient should have enough

## 2016-04-03 MED FILL — PROGESTERONE 100 MG CAPSULE: 100 | 90 days supply | Qty: 90 | Fill #0

## 2016-04-03 NOTE — Telephone Encounter (Signed)
Per pharmacy, patient has rx on file. They will refill.

## 2016-04-04 MED FILL — ALPRAZolam 0.25 MG TABS: 0.25 | 30 days supply | Qty: 30 | Fill #0

## 2016-04-10 NOTE — Telephone Encounter (Signed)
Routing to clinical staff for review. Okay to close encounter or is further follow up needed?

## 2016-04-29 MED FILL — ESTRADIOL PATCH 0.0375: 0.0375 | 28 days supply | Qty: 8 | Fill #1

## 2016-05-29 MED FILL — ESTRADIOL PATCH 0.0375: 0.0375 | 28 days supply | Qty: 8 | Fill #2

## 2016-06-10 DIAGNOSIS — Z23 Encounter for immunization: Secondary | ICD-10-CM | POA: Diagnosis not present

## 2016-06-10 DIAGNOSIS — E559 Vitamin D deficiency, unspecified: Secondary | ICD-10-CM | POA: Diagnosis not present

## 2016-06-10 DIAGNOSIS — Z1211 Encounter for screening for malignant neoplasm of colon: Secondary | ICD-10-CM | POA: Diagnosis not present

## 2016-06-10 DIAGNOSIS — Z79890 Hormone replacement therapy (postmenopausal): Secondary | ICD-10-CM | POA: Diagnosis not present

## 2016-06-10 DIAGNOSIS — Z86010 Personal history of colonic polyps: Secondary | ICD-10-CM | POA: Diagnosis not present

## 2016-06-10 DIAGNOSIS — Z1231 Encounter for screening mammogram for malignant neoplasm of breast: Secondary | ICD-10-CM | POA: Diagnosis not present

## 2016-06-10 DIAGNOSIS — M85859 Other specified disorders of bone density and structure, unspecified thigh: Secondary | ICD-10-CM | POA: Diagnosis not present

## 2016-06-10 DIAGNOSIS — Z91030 Bee allergy status: Secondary | ICD-10-CM | POA: Diagnosis not present

## 2016-06-10 DIAGNOSIS — Z0000 Encounter for general adult medical examination without abnormal findings: Secondary | ICD-10-CM | POA: Diagnosis not present

## 2016-07-03 MED FILL — PROGESTERONE 100 MG CAPSULE: 100 | 90 days supply | Qty: 90 | Fill #1

## 2016-07-03 MED FILL — EPINEPHRINE 0.3 MG AUTO-INJ: 0.3 | 30 days supply | Qty: 2 | Fill #0

## 2016-07-03 MED FILL — VIT D2 1.25 MG (50,000 UNIT: 1.25 MG | 84 days supply | Qty: 12 | Fill #0

## 2016-07-09 MED FILL — ESTRADIOL PATCH 0.0375: 0.0375 | 28 days supply | Qty: 8 | Fill #3

## 2016-07-16 DIAGNOSIS — H5203 Hypermetropia, bilateral: Secondary | ICD-10-CM | POA: Diagnosis not present

## 2016-07-18 DIAGNOSIS — M8588 Other specified disorders of bone density and structure, other site: Secondary | ICD-10-CM | POA: Diagnosis not present

## 2016-07-18 DIAGNOSIS — Z780 Asymptomatic menopausal state: Secondary | ICD-10-CM | POA: Diagnosis not present

## 2016-08-12 MED FILL — ESTRADIOL PATCH 0.0375: 0.0375 | 28 days supply | Qty: 8 | Fill #4

## 2016-09-19 MED FILL — ESTRADIOL PATCH 0.0375: 0.0375 | 28 days supply | Qty: 8 | Fill #5

## 2016-10-02 MED FILL — PROGESTERONE 100 MG CAPSULE: 100 | 90 days supply | Qty: 90 | Fill #2

## 2016-10-23 MED FILL — ESTRADIOL PATCH 0.0375: 0.0375 | 28 days supply | Qty: 8 | Fill #6

## 2016-11-14 MED FILL — ESTRADIOL PATCH 0.0375: 0.0375 | 28 days supply | Qty: 8 | Fill #7

## 2016-11-26 ENCOUNTER — Other Ambulatory Visit: Payer: Self-pay | Admitting: Obstetrics & Gynecology

## 2016-11-26 ENCOUNTER — Other Ambulatory Visit: Payer: Self-pay | Admitting: Nurse Practitioner

## 2016-11-26 DIAGNOSIS — Z1231 Encounter for screening mammogram for malignant neoplasm of breast: Secondary | ICD-10-CM

## 2016-12-23 MED FILL — ESTRADIOL PATCH 0.0375: 0.0375 | 28 days supply | Qty: 8 | Fill #8

## 2017-01-03 MED FILL — PROGESTERONE 100 MG CAPSULE: 100 | 90 days supply | Qty: 90 | Fill #3

## 2017-01-06 ENCOUNTER — Ambulatory Visit
Admission: RE | Admit: 2017-01-06 | Discharge: 2017-01-06 | Disposition: A | Discharge status: Home / Self Care | Payer: 59 | Source: Ambulatory Visit | Attending: Nurse Practitioner | Admitting: Nurse Practitioner

## 2017-01-06 DIAGNOSIS — Z1231 Encounter for screening mammogram for malignant neoplasm of breast: Secondary | ICD-10-CM | POA: Diagnosis not present

## 2017-01-20 ENCOUNTER — Ambulatory Visit: Payer: 59 | Admitting: Nurse Practitioner

## 2017-01-31 ENCOUNTER — Other Ambulatory Visit: Payer: Self-pay | Admitting: *Deleted

## 2017-01-31 MED ORDER — ESTRADIOL 0.0375 MG/24HR TD PTTW: 1.0000 | MEDICATED_PATCH | TRANSDERMAL | 0 refills | Status: DC

## 2017-01-31 NOTE — Telephone Encounter (Signed)
Medication refill request: Vivelle dot Last AEX:  01/17/16 PG Next AEX: 02/10/17 SM Last MMG (if hormonal medication request): 01/07/17 BIRADS1:Neg  Refill authorized: 01/18/16 #8/12R. Today please advise.

## 2017-02-04 MED FILL — ESTRADIOL PATCH 0.0375: 0.0375 | 28 days supply | Qty: 8 | Fill #0

## 2017-02-10 ENCOUNTER — Encounter: Payer: Self-pay | Admitting: Obstetrics & Gynecology

## 2017-02-10 ENCOUNTER — Ambulatory Visit (INDEPENDENT_AMBULATORY_CARE_PROVIDER_SITE_OTHER): Payer: 59 | Admitting: Obstetrics & Gynecology

## 2017-02-10 VITALS — BP 100/60 | HR 80 | Resp 16 | Ht 64.0 in | Wt 111.6 lb

## 2017-02-10 DIAGNOSIS — Z01419 Encounter for gynecological examination (general) (routine) without abnormal findings: Secondary | ICD-10-CM | POA: Diagnosis not present

## 2017-02-10 DIAGNOSIS — Z79890 Hormone replacement therapy (postmenopausal): Secondary | ICD-10-CM | POA: Diagnosis not present

## 2017-02-10 MED ORDER — ALPRAZOLAM 0.25 MG PO TABS
0.2500 mg | ORAL_TABLET | Freq: Every evening | ORAL | 1 refills | Status: DC | PRN
Start: 2017-02-10 — End: 2017-09-01

## 2017-02-10 MED ORDER — VITAMIN D (ERGOCALCIFEROL) 1.25 MG (50000 UNIT) PO CAPS: ORAL_CAPSULE | ORAL | 4 refills | Status: DC

## 2017-02-10 MED ORDER — PROGESTERONE MICRONIZED 100 MG PO CAPS: ORAL_CAPSULE | ORAL | 4 refills | Status: DC

## 2017-02-10 MED ORDER — ESTRADIOL 0.025 MG/24HR TD PTTW: 1.0000 | MEDICATED_PATCH | TRANSDERMAL | 4 refills | Status: DC

## 2017-02-10 MED FILL — VIT D2 1.25 MG (50,000 UNIT: 1.25 MG | 84 days supply | Qty: 12 | Fill #0

## 2017-02-10 NOTE — Progress Notes (Signed)
61 y.o. G61P0103 Married Caucasian F here for annual exam.  Doing well.  Denies vaginal bleeding.  Spouse is Dr. Caryl Comes.  Have been doing some traveling together.  Patient's last menstrual period was 08/12/2011 (exact date).          Sexually active: Yes.    The current method of family planning is post menopausal status.    Exercising: Yes.    tennis Smoker:  no  Health Maintenance: Pap:  01/16/15 negative, HR HPV negative, 08/12/11 negative, HR HPV negative  History of abnormal Pap:  Yes, years ago MMG:  01/07/17 BIRADS 1 negative Colonoscopy:  2017, repeat 10 years  BMD:   11/26/13 osteopenia.  Done in 2018 at [facility].  I do not have results.   TDaP:  03/31/07  Pneumonia vaccine(s):  Not done Zostavax:   Not done Hep C testing: 01/17/16 negative Screening Labs: PCP takes care of labs   reports that she has never smoked. She has never used smokeless tobacco. She reports that she drinks alcohol. She reports that she does not use drugs.  Past Medical History:  Diagnosis Date  . Dyspareunia   . Endometriosis     Past Surgical History:  Procedure Laterality Date  . CESAREAN SECTION  1991  . LAPAROSCOPY  1989  . OVARIAN CYST REMOVAL  1988   benign- endometrioma- laparatomy then danazol 6 months  . SPINAL FUSION  2006   C5-6 rupture    Current Outpatient Prescriptions  Medication Sig Dispense Refill  . ALPRAZolam (XANAX) 0.25 MG tablet Take 1 tablet (0.25 mg total) by mouth at bedtime as needed for sleep. 30 tablet 1  . Calcium Carbonate-Vitamin D (CALCIUM 500 + D PO) Take 1 tablet by mouth daily.    Marland Kitchen estradiol (VIVELLE-DOT) 0.0375 MG/24HR Place 1 patch onto the skin 2 (two) times a week. 8 patch 0  . ibuprofen (ADVIL,MOTRIN) 200 MG tablet Take 600 mg by mouth 3 (three) times daily as needed for pain.    . progesterone (PROMETRIUM) 100 MG capsule TAKE 1 CAPSULE (100 MG) BY MOUTH EVERY EVENING. 90 capsule 3  . Vitamin D, Ergocalciferol, (DRISDOL) 50000 units CAPS capsule TAKE 1 CAPSULE  BY MOUTH EVERY 7 DAYS ON MONDAYS 30 capsule 3   No current facility-administered medications for this visit.     Family History  Problem Relation Age of Onset  . Diabetes Maternal Grandfather   . Heart disease Maternal Grandfather   . Polymyalgia rheumatica Father   . Renal Disease Father   . Hypertension Father   . Stroke Father        with memory loss  . Heart disease Paternal Grandfather   . Hypertension Mother   . Hyperlipidemia Mother   . Stroke Mother     ROS:  Pertinent items are noted in HPI.  Otherwise, a comprehensive ROS was negative.  Exam:   BP 100/60 (BP Location: Right Arm, Patient Position: Sitting, Cuff Size: Normal)   Pulse 80   Resp 16   Ht 5\' 4"  (1.626 m)   Wt 111 lb 9.6 oz (50.6 kg)   LMP 08/12/2011 (Exact Date)   BMI 19.16 kg/m    Height: 5\' 4"  (162.6 cm)  Ht Readings from Last 3 Encounters:  02/10/17 5\' 4"  (1.626 m)  01/17/16 5\' 4"  (1.626 m)  01/16/15 5' 4.25" (1.632 m)    General appearance: alert, cooperative and appears stated age Head: Normocephalic, without obvious abnormality, atraumatic Neck: no adenopathy, supple, symmetrical, trachea midline and thyroid  normal to inspection and palpation Lungs: clear to auscultation bilaterally Breasts: normal appearance, no masses or tenderness Heart: regular rate and rhythm Abdomen: soft, non-tender; bowel sounds normal; no masses,  no organomegaly Extremities: extremities normal, atraumatic, no cyanosis or edema Skin: Skin color, texture, turgor normal. No rashes or lesions Lymph nodes: Cervical, supraclavicular, and axillary nodes normal. No abnormal inguinal nodes palpated Neurologic: Grossly normal   Pelvic: External genitalia:  no lesions              Urethra:  normal appearing urethra with no masses, tenderness or lesions              Bartholins and Skenes: normal                 Vagina: normal appearing vagina with normal color and discharge, no lesions              Cervix: no lesions               Pap taken: No. Bimanual Exam:  Uterus:  normal size, contour, position, consistency, mobility, non-tender              Adnexa: normal adnexa and no mass, fullness, tenderness               Rectovaginal: Confirms               Anus:  normal sphincter tone, no lesions  Chaperone was present for exam.  A:  Well Woman with normal exam PMP, on HRT Vit D deficiency Osteopenia Insomnia with occasional Xanax use  P:   Mammogram guidelines reviewed.  D/w 3D MMG pap smear not obtained.  Pap with neg HR HPV 8/16 Will lower HRT this year to vivelle dot 0.025mg  patches twice weekly and Prometrium 100mg  daily.  Rx to pharmacy for 90 day supply.  Risks reviewed with pt.  Will work towards weaning to as low as we can go over the next year or two. Xanax 0.25mg  po prn insomnia.  #30/1RF return annually or prn

## 2017-02-13 DIAGNOSIS — Z79890 Hormone replacement therapy (postmenopausal): Secondary | ICD-10-CM | POA: Insufficient documentation

## 2017-02-18 MED FILL — ALPRAZolam 0.25 MG TABS: 0.25 | 30 days supply | Qty: 30 | Fill #0

## 2017-02-25 ENCOUNTER — Telehealth: Payer: Self-pay | Admitting: *Deleted

## 2017-02-25 NOTE — Telephone Encounter (Signed)
Attempted to reach patient to let her know her TDAP was last done in 2008, so she is due. Mailbox full- unable to leave message.

## 2017-02-28 ENCOUNTER — Ambulatory Visit (INDEPENDENT_AMBULATORY_CARE_PROVIDER_SITE_OTHER): Payer: 59 | Admitting: Sports Medicine

## 2017-02-28 VITALS — BP 98/60 | Ht 64.0 in | Wt 113.0 lb

## 2017-02-28 DIAGNOSIS — S93499A Sprain of other ligament of unspecified ankle, initial encounter: Secondary | ICD-10-CM

## 2017-02-28 NOTE — Progress Notes (Signed)
   Subjective:    Patient ID: Rachel Beasley, female    DOB: [birth year], 61 y.o.   MRN: [id number]  HPI 61 yo Rachel Beasley presents today to discuss her Left ankle injury 2-weeks ago. She reports the injury occurred while playing tennis. She states she planted her tennis shoe on an indoor court and rolled her ankle toward her midline. After the injury she applied a brace someone had hand on hand. She continued to play with little pain and discomfort. She iced it that night. The following morning she found her lateral ankle to be swollen. Over the course of the last two-weeks she has been, R.I.C.E, wearing a soft ASO-style. She reports decreased swelling but still admits to some pain over the lateral aspect of her ankle. She is her today seeking advise on what to do next so that she can get back to playing tennis.    Review of Systems Musculoskeletal: lateral ankle swelling, mild weakness on toeing activities Neurovascular: Denies numbness, tingling, paleness/palor    Objective:   Physical Exam  Gen: No apparent distress, pleasant and cooperative Resp: no labor breathing  Left ankle Inspection: mild swelling in lateral anterior ankle Palpation:There is tenderness to palpation over the ATF. No tenderness over the distal fibula, medial malleolus, base of the fifth metatarsal, or navicular. ROM: full dorsiflexion 15 degree, plantar flexion 40 degrees, supination, and pronation normal Strength: intact 5/5 strength in dorsiflexion, plantar flexion, inversion, and eversion Stability: able to stand on tiptoes on left without sensation of instability, tiptoe walking and heel walking also noted to be normal Special tests: slightly positive anterior drawer, +1 talar tilt, negative tarsal squeeze testing      Assessment & Plan:  Lateral ankle sprain - Likely an ATFL sprain. Pt was advised to continue to ice and elevate the ankle while resting. We will continue to increase her stability and  proprioception with home exercises that were explained/demonstrated to her prior to leaving. She was also given an ASO brace to wear when she is partaking in activities that are on uneven ground or risk ankle inversion. She was asked to ease back into exercise (i.e. Boot camp and tennis) over the next 1-2 weeks, using her pain as a guide. It was explained that it is not uncommon to have some swelling even after the pain subsides. However, if the pain worsens, swelling increases, or if she develops any other worsesome symptoms she was encouraged to return to clinic.

## 2017-03-05 NOTE — Telephone Encounter (Signed)
Call to patient. Patient notified of last TDAP done in 2008. Patient verbalized understanding. Patient states she has an appointment in late Fall/Winter with PCP and will follow up on TDAP and Shingles vaccines.   Patient agreeable to disposition. Will close encounter.

## 2017-03-13 MED FILL — ESTRADIOL 0.025 MG PATCH: 0.025 | 28 days supply | Qty: 8 | Fill #0

## 2017-04-02 MED FILL — PROGESTERONE 100 MG CAPSULE: 100 | 90 days supply | Qty: 90 | Fill #0

## 2017-05-09 MED FILL — ESTRADIOL 0.025 MG PATCH: 0.025 | 28 days supply | Qty: 8 | Fill #1

## 2017-06-05 MED FILL — VIT D2 1.25 MG (50,000 UNIT: 1.25 MG | 84 days supply | Qty: 12 | Fill #1

## 2017-06-10 MED FILL — ESTRADIOL 0.025 MG PATCH: 0.025 | 28 days supply | Qty: 8 | Fill #2

## 2017-07-01 DIAGNOSIS — Z91030 Bee allergy status: Secondary | ICD-10-CM | POA: Diagnosis not present

## 2017-07-01 DIAGNOSIS — Z23 Encounter for immunization: Secondary | ICD-10-CM | POA: Diagnosis not present

## 2017-07-01 DIAGNOSIS — Z0000 Encounter for general adult medical examination without abnormal findings: Secondary | ICD-10-CM | POA: Diagnosis not present

## 2017-07-01 DIAGNOSIS — Z1211 Encounter for screening for malignant neoplasm of colon: Secondary | ICD-10-CM | POA: Diagnosis not present

## 2017-07-01 DIAGNOSIS — M85859 Other specified disorders of bone density and structure, unspecified thigh: Secondary | ICD-10-CM | POA: Diagnosis not present

## 2017-07-01 DIAGNOSIS — Z86010 Personal history of colonic polyps: Secondary | ICD-10-CM | POA: Diagnosis not present

## 2017-07-04 MED FILL — PROGESTERONE 100 MG CAPSULE: 100 | 90 days supply | Qty: 90 | Fill #1

## 2017-07-10 MED FILL — EPINEPHRINE 0.3 MG AUTO-INJ: 0.3 | 30 days supply | Qty: 2 | Fill #0

## 2017-07-14 DIAGNOSIS — L821 Other seborrheic keratosis: Secondary | ICD-10-CM | POA: Diagnosis not present

## 2017-07-14 DIAGNOSIS — D2261 Melanocytic nevi of right upper limb, including shoulder: Secondary | ICD-10-CM | POA: Diagnosis not present

## 2017-07-14 DIAGNOSIS — L570 Actinic keratosis: Secondary | ICD-10-CM | POA: Diagnosis not present

## 2017-07-14 DIAGNOSIS — D225 Melanocytic nevi of trunk: Secondary | ICD-10-CM | POA: Diagnosis not present

## 2017-07-14 DIAGNOSIS — D224 Melanocytic nevi of scalp and neck: Secondary | ICD-10-CM | POA: Diagnosis not present

## 2017-07-14 DIAGNOSIS — L814 Other melanin hyperpigmentation: Secondary | ICD-10-CM | POA: Diagnosis not present

## 2017-07-14 MED FILL — FLUOROURACIL 5% CREAM: 5 | 14 days supply | Qty: 40 | Fill #0

## 2017-07-22 MED FILL — ESTRADIOL 0.025 MG PATCH: 0.025 | 28 days supply | Qty: 8 | Fill #3

## 2017-08-27 DIAGNOSIS — H5203 Hypermetropia, bilateral: Secondary | ICD-10-CM | POA: Diagnosis not present

## 2017-09-01 ENCOUNTER — Other Ambulatory Visit: Payer: Self-pay | Admitting: Obstetrics & Gynecology

## 2017-09-01 MED ORDER — ALPRAZOLAM 0.25 MG PO TABS: 0.2500 mg | ORAL_TABLET | Freq: Every evening | ORAL | 1 refills | Status: DC | PRN

## 2017-09-01 MED FILL — ESTRADIOL 0.025 MG PATCH: 0.025 | 28 days supply | Qty: 8 | Fill #4

## 2017-09-01 NOTE — Telephone Encounter (Signed)
Medication refill request: xanax Last AEX:  02/10/17 SM  Next AEX: 04/17/18  Last MMG (if hormonal medication request): 01/06/17 BIRADS 1 negative  Refill authorized: 02/10/17 #30, 1 RF. Today, please advise.

## 2017-09-01 NOTE — Telephone Encounter (Signed)
Patient would like a refill on the xanax prescriptrion sent to [facility] [address].

## 2017-09-02 NOTE — Telephone Encounter (Signed)
Rx faxed to pharmacy today 

## 2017-09-11 MED FILL — ALPRAZolam 0.25 MG TABS: 0.25 | 30 days supply | Qty: 30 | Fill #0

## 2017-09-25 MED FILL — ESTRADIOL 0.025 MG PATCH: 0.025 | 56 days supply | Qty: 16 | Fill #5

## 2017-10-07 MED FILL — PROGESTERONE 100 MG CAPSULE: 100 | 90 days supply | Qty: 90 | Fill #2

## 2017-12-11 ENCOUNTER — Other Ambulatory Visit: Payer: Self-pay | Admitting: Obstetrics & Gynecology

## 2017-12-11 DIAGNOSIS — Z1231 Encounter for screening mammogram for malignant neoplasm of breast: Secondary | ICD-10-CM

## 2017-12-22 MED FILL — ESTRADIOL 0.025 MG PATCH: 0.025 | 84 days supply | Qty: 24 | Fill #6

## 2017-12-30 MED FILL — VIT D2 1.25 MG (50,000 UNIT: 1.25 MG | 84 days supply | Qty: 12 | Fill #2

## 2018-01-06 MED FILL — PROGESTERONE 100 MG CAPSULE: 100 | 90 days supply | Qty: 90 | Fill #3

## 2018-01-19 ENCOUNTER — Ambulatory Visit: Payer: 59

## 2018-01-20 ENCOUNTER — Ambulatory Visit
Admission: RE | Admit: 2018-01-20 | Discharge: 2018-01-20 | Disposition: A | Discharge status: Home / Self Care | Payer: 59 | Source: Ambulatory Visit | Attending: Obstetrics & Gynecology | Admitting: Obstetrics & Gynecology

## 2018-01-20 DIAGNOSIS — Z1231 Encounter for screening mammogram for malignant neoplasm of breast: Secondary | ICD-10-CM

## 2018-04-03 ENCOUNTER — Other Ambulatory Visit: Payer: Self-pay | Admitting: Obstetrics & Gynecology

## 2018-04-03 NOTE — Telephone Encounter (Signed)
Medication refill request: Vivella -Dot  Last AEX:  02/10/17 Next AEX:04/17/18  Last MMG (if hormonal medication request): 01/20/18 Bi-rads 1 neg Refill authorized: #8 with 0RF Please refill until Aex if appropriate.

## 2018-04-06 ENCOUNTER — Other Ambulatory Visit: Payer: Self-pay | Admitting: Obstetrics & Gynecology

## 2018-04-06 MED FILL — ESTRADIOL 0.025 MG PATCH: 0.025 | 84 days supply | Qty: 24 | Fill #0

## 2018-04-06 NOTE — Telephone Encounter (Signed)
Medication refill request: prometrium  Last AEX:  02/10/17 SM Next AEX: 04/17/18 SM Last MMG (if hormonal medication request): 01/20/18 BIRADS1:Neg  Refill authorized: 02/10/17 #90caps/4R. Today #30/0R?

## 2018-04-07 MED FILL — PROGESTERONE 100 MG CAPSULE: 100 | 90 days supply | Qty: 90 | Fill #0

## 2018-04-17 ENCOUNTER — Ambulatory Visit (INDEPENDENT_AMBULATORY_CARE_PROVIDER_SITE_OTHER): Payer: 59 | Admitting: Obstetrics & Gynecology

## 2018-04-17 ENCOUNTER — Other Ambulatory Visit: Payer: Self-pay

## 2018-04-17 ENCOUNTER — Other Ambulatory Visit (HOSPITAL_COMMUNITY)
Admission: RE | Admit: 2018-04-17 | Discharge: 2018-04-17 | Disposition: A | Discharge status: Home / Self Care | Payer: 59 | Source: Ambulatory Visit | Attending: Obstetrics & Gynecology | Admitting: Obstetrics & Gynecology

## 2018-04-17 ENCOUNTER — Encounter: Payer: Self-pay | Admitting: Obstetrics & Gynecology

## 2018-04-17 VITALS — BP 108/60 | HR 84 | Resp 16 | Ht 64.0 in | Wt 113.6 lb

## 2018-04-17 DIAGNOSIS — Z124 Encounter for screening for malignant neoplasm of cervix: Secondary | ICD-10-CM | POA: Diagnosis not present

## 2018-04-17 DIAGNOSIS — Z01419 Encounter for gynecological examination (general) (routine) without abnormal findings: Secondary | ICD-10-CM | POA: Diagnosis not present

## 2018-04-17 MED ORDER — PROGESTERONE MICRONIZED 100 MG PO CAPS: ORAL_CAPSULE | ORAL | 4 refills | Status: DC

## 2018-04-17 MED ORDER — ALPRAZOLAM 0.25 MG PO TABS: 0.2500 mg | ORAL_TABLET | Freq: Every evening | ORAL | 0 refills | Status: DC | PRN

## 2018-04-17 MED ORDER — ESTRADIOL 0.025 MG/24HR TD PTTW: 1.0000 | MEDICATED_PATCH | TRANSDERMAL | 4 refills | Status: DC

## 2018-04-17 MED ORDER — VITAMIN D (ERGOCALCIFEROL) 1.25 MG (50000 UNIT) PO CAPS: ORAL_CAPSULE | ORAL | 4 refills | Status: DC

## 2018-04-17 MED FILL — ALPRAZolam 0.25 MG TABS: 0.25 | 30 days supply | Qty: 30 | Fill #0

## 2018-04-17 MED FILL — VIT D2 1.25 MG (50,000 UNIT: 1.25 MG | 84 days supply | Qty: 12 | Fill #0

## 2018-04-17 NOTE — Progress Notes (Signed)
62 y.o. G77P0103 Married White or Caucasian female here for annual exam.  Doing well.  Denies vaginal bleeding.    PCP:  Dr. Fara Olden.    Patient's last menstrual period was 08/12/2011 (exact date).          Sexually active: Yes.    The current method of family planning is post menopausal status.    Exercising: Yes.    tennis, walk, bootcamp Smoker:  no  Health Maintenance: Pap:  01/16/15 Neg. HR HPV:neg  History of abnormal Pap:  yes MMG:  01/20/18 BIRADS1:Neg  Colonoscopy:  04/10/15 f/u 10 years  BMD:   07/18/16 Osteopenia, -1.2 TDaP:  03/2007.  She will check with PCP about this before having it updated  Pneumonia vaccine(s):  no Shingrix:   no Hep C testing: 01/17/16 Neg  Screening Labs: PCP    reports that she has never smoked. She has never used smokeless tobacco. She reports that she drinks alcohol. She reports that she does not use drugs.  Past Medical History:  Diagnosis Date  . Dyspareunia   . Endometriosis     Past Surgical History:  Procedure Laterality Date  . CESAREAN SECTION  1991  . LAPAROSCOPY  1989  . OVARIAN CYST REMOVAL  1988   benign- endometrioma- laparatomy then danazol 6 months  . SPINAL FUSION  2006   C5-6 rupture    Current Outpatient Medications  Medication Sig Dispense Refill  . ALPRAZolam (XANAX) 0.25 MG tablet Take 1 tablet (0.25 mg total) by mouth at bedtime as needed for sleep. 30 tablet 1  . Calcium Carbonate-Vitamin D (CALCIUM 500 + D PO) Take 1 tablet by mouth daily.    Marland Kitchen estradiol (VIVELLE-DOT) 0.025 MG/24HR PLACE 1 PATCH ONTO THE SKIN 2 TIMES A WEEK. 24 patch 0  . ibuprofen (ADVIL,MOTRIN) 200 MG tablet Take 600 mg by mouth 3 (three) times daily as needed for pain.    . progesterone (PROMETRIUM) 100 MG capsule TAKE 1 CAPSULE (100 MG) BY MOUTH EVERY EVENING. 90 capsule 0  . Vitamin D, Ergocalciferol, (DRISDOL) 50000 units CAPS capsule TAKE 1 CAPSULE BY MOUTH EVERY 7 DAYS ON MONDAYS 12 capsule 4   No current facility-administered  medications for this visit.     Family History  Problem Relation Age of Onset  . Diabetes Maternal Grandfather   . Heart disease Maternal Grandfather   . Polymyalgia rheumatica Father   . Renal Disease Father   . Hypertension Father   . Stroke Father        with memory loss  . Heart disease Paternal Grandfather   . Hypertension Mother   . Hyperlipidemia Mother   . Stroke Mother     Review of Systems  All other systems reviewed and are negative.   Exam:   BP 108/60 (BP Location: Right Arm, Patient Position: Sitting, Cuff Size: Normal)   Pulse 84   Resp 16   Ht 5\' 4"  (1.626 m)   Wt 113 lb 9.6 oz (51.5 kg)   LMP 08/12/2011 (Exact Date)   BMI 19.50 kg/m    Height: 5\' 4"  (162.6 cm)  Ht Readings from Last 3 Encounters:  04/17/18 5\' 4"  (1.626 m)  02/28/17 5\' 4"  (1.626 m)  02/10/17 5\' 4"  (1.626 m)    General appearance: alert, cooperative and appears stated age Head: Normocephalic, without obvious abnormality, atraumatic Neck: no adenopathy, supple, symmetrical, trachea midline and thyroid normal to inspection and palpation Lungs: clear to auscultation bilaterally Breasts: normal appearance, no masses or tenderness  Heart: regular rate and rhythm Abdomen: soft, non-tender; bowel sounds normal; no masses,  no organomegaly Extremities: extremities normal, atraumatic, no cyanosis or edema Skin: Skin color, texture, turgor normal. No rashes or lesions Lymph nodes: Cervical, supraclavicular, and axillary nodes normal. No abnormal inguinal nodes palpated Neurologic: Grossly normal   Pelvic: External genitalia:  no lesions              Urethra:  normal appearing urethra with no masses, tenderness or lesions              Bartholins and Skenes: normal                 Vagina: normal appearing vagina with normal color and discharge, no lesions              Cervix: no lesions              Pap taken: Yes.   Bimanual Exam:  Uterus:  normal size, contour, position, consistency,  mobility, non-tender              Adnexa: normal adnexa and no mass, fullness, tenderness               Rectovaginal: Confirms               Anus:  normal sphincter tone, no lesions  Chaperone was present for exam.  A:  Well Woman with normal exam PMP, on low dosed HRT H/o low Vit D Osteopenia, mild Insomia  P:   Mammogram guidelines reviewed pap smear with HR HPV obtained today Continue with vivelle dot 0.025mg  patches twice weekly and Prometrium 100mg  daily.  90 day supply with RF to pharmacy. Also RFed Vit D 50K weekly.  Will recheck next year. Lab work done with PCP RF for Xanax 0.25mg  1/2 - 1 tab qhs prn.  #30/0RF return annually or prn

## 2018-04-17 NOTE — Patient Instructions (Addendum)
Lennie Odor, PA-C  [facility] [address] [place], [place] [address] Call: [phone] Office Hours: [place] - Fri 8:30am - 5:00pm

## 2018-04-21 LAB — CYTOLOGY - PAP
Diagnosis: NEGATIVE
HPV: NOT DETECTED

## 2018-06-22 MED FILL — SHINGRIX 50 MCG SUS: 50 | 1 days supply | Qty: 1 | Fill #0

## 2018-07-07 DIAGNOSIS — Z91030 Bee allergy status: Secondary | ICD-10-CM | POA: Diagnosis not present

## 2018-07-07 DIAGNOSIS — Z1239 Encounter for other screening for malignant neoplasm of breast: Secondary | ICD-10-CM | POA: Diagnosis not present

## 2018-07-07 DIAGNOSIS — Z23 Encounter for immunization: Secondary | ICD-10-CM | POA: Diagnosis not present

## 2018-07-07 DIAGNOSIS — M8588 Other specified disorders of bone density and structure, other site: Secondary | ICD-10-CM | POA: Diagnosis not present

## 2018-07-07 DIAGNOSIS — Z1211 Encounter for screening for malignant neoplasm of colon: Secondary | ICD-10-CM | POA: Diagnosis not present

## 2018-07-07 DIAGNOSIS — Z0000 Encounter for general adult medical examination without abnormal findings: Secondary | ICD-10-CM | POA: Diagnosis not present

## 2018-07-07 DIAGNOSIS — Z79890 Hormone replacement therapy (postmenopausal): Secondary | ICD-10-CM | POA: Diagnosis not present

## 2018-07-07 MED FILL — EPINEPHRINE 0.3 MG AUTO-INJ: 0.3 | 30 days supply | Qty: 2 | Fill #0

## 2018-07-14 ENCOUNTER — Other Ambulatory Visit: Payer: Self-pay | Admitting: Internal Medicine

## 2018-07-14 DIAGNOSIS — M8588 Other specified disorders of bone density and structure, other site: Secondary | ICD-10-CM

## 2018-07-14 MED FILL — PROGESTERONE 100 MG CAPSULE: 100 | 90 days supply | Qty: 90 | Fill #0

## 2018-08-13 ENCOUNTER — Other Ambulatory Visit: Payer: Self-pay | Admitting: Obstetrics & Gynecology

## 2018-08-14 NOTE — Telephone Encounter (Signed)
Medication refill request: vivelle dot 0.025 Last AEX:  04-17-2018 Rx was sent #24 with 4 refills at [facility].No rx should be needed. This rx request denied

## 2018-08-17 ENCOUNTER — Telehealth: Payer: Self-pay | Admitting: *Deleted

## 2018-08-17 NOTE — Telephone Encounter (Signed)
Patient has a question about her estradiol prescription. 

## 2018-08-17 NOTE — Telephone Encounter (Signed)
Spoke with patient.  On estradiol patches 0.025 mg. Wants to know if Dr. Sabra Heck thinks it is time for her to start coming off of HRT, if so, would like instructions for weaning of off the patches.  If okay to continue until next annual exam in 08/24/2019, pt states she is okay with that too.  She states she finds herself waking up more in the middle of night "feeling hot."  Would like Dr. Ammie Ferrier opinion if she should wean off or "grin and bear it" or continue on HRT. Calling for "a plan" from Dr. Sabra Heck.

## 2018-08-19 NOTE — Telephone Encounter (Signed)
Pt calling for update. Advised will call back with message from Dr. Sabra Heck.  Pt expresses understanding.

## 2018-08-24 MED FILL — ESTRADIOL 0.025 MG/24HR PTT: 0.025 | 28 days supply | Qty: 8 | Fill #0

## 2018-08-24 NOTE — Telephone Encounter (Signed)
If she decides she wants to wean off the patch, she can just cut it in half (which will make it very small) and then keep using it twice weekly for about six more weeks.  If doesn't have change in symptoms, then can just stop.  I think she really wants to stop so if desires to do that now, ok to proceed.  Thanks.

## 2018-08-24 NOTE — Telephone Encounter (Signed)
Patient is asking to discuss weaning off her patches. She is asking for instruction on the proper way to do this? She stated she will " continue for a month and regroup next month".

## 2018-08-25 NOTE — Telephone Encounter (Signed)
Call returned to patient, no answer, voicemail full, unable to leave msg.

## 2018-08-27 NOTE — Telephone Encounter (Signed)
Attempted to reach patient at number provided, there was no answer and voicemail box is full.

## 2018-08-28 NOTE — Telephone Encounter (Signed)
Patient is returning a call to JIll. °

## 2018-08-28 NOTE — Telephone Encounter (Signed)
No answer, mailbox full, unable to leave message.

## 2018-08-28 NOTE — Telephone Encounter (Signed)
Spoke with patient, advised as seen below per Dr. Miller. Patient verbalizes understanding and is agreeable.   Encounter closed.  

## 2018-09-08 ENCOUNTER — Other Ambulatory Visit: Payer: 59

## 2018-09-18 DIAGNOSIS — L089 Local infection of the skin and subcutaneous tissue, unspecified: Secondary | ICD-10-CM | POA: Diagnosis not present

## 2018-09-18 DIAGNOSIS — W5501XA Bitten by cat, initial encounter: Secondary | ICD-10-CM | POA: Diagnosis not present

## 2018-09-18 DIAGNOSIS — S61451A Open bite of right hand, initial encounter: Secondary | ICD-10-CM | POA: Diagnosis not present

## 2018-09-18 MED FILL — AMOX-CLAV 875-125 MG TABLET: 875-125 | 7 days supply | Qty: 14 | Fill #0

## 2018-09-21 MED FILL — ESTRADIOL 0.025 MG/24HR PTT: 0.025 | 84 days supply | Qty: 24 | Fill #1

## 2018-10-14 MED FILL — PROGESTERONE 100 MG CAPSULE: 100 | 90 days supply | Qty: 90 | Fill #0

## 2018-10-22 MED FILL — VIT D2 1.25 MG (50,000 UNIT: 1.25 MG | 84 days supply | Qty: 12 | Fill #0

## 2018-10-28 DIAGNOSIS — H5203 Hypermetropia, bilateral: Secondary | ICD-10-CM | POA: Diagnosis not present

## 2018-10-28 DIAGNOSIS — H2513 Age-related nuclear cataract, bilateral: Secondary | ICD-10-CM | POA: Diagnosis not present

## 2018-12-02 MED FILL — SHINGRIX 50 MCG SUS: 50 | 1 days supply | Qty: 1 | Fill #1

## 2018-12-07 ENCOUNTER — Other Ambulatory Visit: Payer: Self-pay | Admitting: Obstetrics & Gynecology

## 2018-12-07 DIAGNOSIS — Z1231 Encounter for screening mammogram for malignant neoplasm of breast: Secondary | ICD-10-CM

## 2019-01-11 MED FILL — PROGESTERONE 100 MG CAPSULE: 100 | 90 days supply | Qty: 90 | Fill #0

## 2019-01-11 MED FILL — VIT D2 1.25 MG (50,000 UNIT: 1.25 MG | 84 days supply | Qty: 12 | Fill #0

## 2019-01-11 MED FILL — ESTRADIOL 0.025 MG/24HR PTT: 0.025 | 84 days supply | Qty: 24 | Fill #0

## 2019-01-12 ENCOUNTER — Ambulatory Visit
Admission: RE | Admit: 2019-01-12 | Discharge: 2019-01-12 | Disposition: A | Discharge status: Home / Self Care | Payer: 59 | Source: Ambulatory Visit | Attending: Internal Medicine | Admitting: Internal Medicine

## 2019-01-12 ENCOUNTER — Other Ambulatory Visit: Payer: Self-pay

## 2019-01-12 DIAGNOSIS — M8588 Other specified disorders of bone density and structure, other site: Secondary | ICD-10-CM

## 2019-01-12 DIAGNOSIS — Z780 Asymptomatic menopausal state: Secondary | ICD-10-CM | POA: Diagnosis not present

## 2019-01-12 DIAGNOSIS — M85852 Other specified disorders of bone density and structure, left thigh: Secondary | ICD-10-CM | POA: Diagnosis not present

## 2019-01-21 ENCOUNTER — Telehealth: Payer: Self-pay | Admitting: Obstetrics & Gynecology

## 2019-01-21 NOTE — Telephone Encounter (Signed)
Spoke with patient. Advised 3D MMG is recommended for screening, but ultimately your choice. Our providers recommend yearly screening MMG. Patient thankful for call. Questions answered.   Routing to Dr. Sabra Heck.   Encounter closed.

## 2019-01-21 NOTE — Telephone Encounter (Signed)
Patient was asked when scheduling her MMG if she would like a 3D MMG? She understands she had a had a 3D mmg last year and wonders if it is necessary to have another 3D MMG this year? She would like Dr.Miller's option on how often you need a 3D MMG?

## 2019-01-25 ENCOUNTER — Ambulatory Visit: Payer: 59

## 2019-02-02 ENCOUNTER — Ambulatory Visit (INDEPENDENT_AMBULATORY_CARE_PROVIDER_SITE_OTHER): Payer: 59 | Admitting: Sports Medicine

## 2019-02-02 ENCOUNTER — Other Ambulatory Visit: Payer: Self-pay

## 2019-02-02 VITALS — BP 102/58 | Ht 64.0 in | Wt 115.0 lb

## 2019-02-02 DIAGNOSIS — M7701 Medial epicondylitis, right elbow: Secondary | ICD-10-CM | POA: Diagnosis not present

## 2019-02-02 DIAGNOSIS — G5701 Lesion of sciatic nerve, right lower limb: Secondary | ICD-10-CM

## 2019-02-02 MED ORDER — NITROGLYCERIN 0.2 MG/HR TD PT24: MEDICATED_PATCH | TRANSDERMAL | 1 refills | Status: DC

## 2019-02-02 MED FILL — NITROGLYCERIN 0.2 MG/HR PTC: 0.2 | 28 days supply | Qty: 7 | Fill #0

## 2019-02-02 NOTE — Progress Notes (Signed)
Subjective:    Patient ID: Rachel Beasley, female    DOB: [birth year], 63 y.o.   MRN: [id number]  CC: (R) buttock pain, (R) medial elbow pain   HPI  Patient is a 63 year-old female presenting with R-sided buttock pain. Patient states pain began last Wednesday after she had a vigorous exercise program in the AM and then played tennis following. She denied any inciting event or trauma during these specific activities, but states she began having a pain/ache in her R-buttock later that afternoon. The exercise program was outdoors and included a lot of recurrent lunges and walking up graded hill. Patient states her pain is a deep pain in her posterior gluteal region. The pain does travel down the posterior side of her leg at times, but denies any numbness/tingling. The pain is worse with prolonged sitting; also worse with running or fast-paced walking. She has tried icing with mild relief. She is also taking 600mg  Ibuprofen BID prn that does provide some relief. She has stopped exercising the last few days secondary to pain/discomfort.   Patient also reports chronic medial elbow pain. Patient states it has bothered her for months now. She reports a history of lateral epicondylitis in the past, that took about a year to heal. She does play tennis and uses a counterforce brace. Recently, she has adjusted the brace for the ball to be near her medial epicondyle vs. Lateral epicondyle. She is still able to participate in activity, but her medial elbow is sore after activity. She is not taking anything for the pain. Denies any muscular weakness, radiculopathy symptoms.   Review of Systems  See HPI, otherwise negative    Objective:   Physical Exam  Gen - alert, in NAD Pulm - respirations unlabored MSK - no spinal tenderness to palpation down thoracic or lumbar spine; full lumbar extension and flexion without pain or radicular symptoms; no joint edema or effusions  R-hip: Normal to inspection without  erythema, effusion, or bony abnormalities + TTP over medial belly of piriformis muscle; no tenderness to palpation over ischial tuberosity; no TTP, effusion, or warmth noted over greater trochanter of femur Full ROM of b/l hips, 5/5 muscle strength of LE's in extension, flexion, abduction/adduction Sensation grossly intact to touch, DP's + 2/4 bilaterally + Reproduction of symptoms with FAIR test Negative FABER, FADIR, Thomas tests Negative Ober test Negative SLR  R-elbow Normal to inspection without erythema, effusion, or bony abnormalities + TTP over medial epicondyle of humerus; no warmth or effusion noted over area or pain over radial head Full ROM of RUE, 5/5 strength with elbow flexion/extension, + pain with pronation  No pain with valgus/varus motion at elbow Sensation grossly intact, no vascular abn + pain with resisted elbow extension & resisted pronation      Assessment & Plan:  1. Piriformis Syndrome + TTP over medial belly of piriformis, slight reproduction with FAIR testing. No hamstring involvement or intraarticular hip pathology upon examination - piriformis muscle stretches daily - hip external rotator exercises daily as demonstrated in clinic - continue Ibuprofen as needed for pain control   2. Medial Epicondylitis  - conservative management, RICE if aggravated - continue with counterforce brace   - perform PT exercises for golfer's elbow as attached in AVS - Nitroglycerin patches, apply 1/4 patch to affected area daily   Renne Musca, DO  02/02/2019  Patient seen and evaluated with the resident.  I agree with the above plan of care.  Treatment as above for  piriformis syndrome and medial epicondylitis.  She will use her nitroglycerin patches on her medial right elbow for 4 weeks.  If she needs to extend treatment beyond 6 weeks I recommended a follow-up with me in the office.  Otherwise, follow-up as needed.

## 2019-02-02 NOTE — Patient Instructions (Signed)

## 2019-02-03 ENCOUNTER — Encounter: Payer: Self-pay | Admitting: Sports Medicine

## 2019-02-10 DIAGNOSIS — Z681 Body mass index (BMI) 19 or less, adult: Secondary | ICD-10-CM | POA: Diagnosis not present

## 2019-02-10 DIAGNOSIS — Z20828 Contact with and (suspected) exposure to other viral communicable diseases: Secondary | ICD-10-CM | POA: Diagnosis not present

## 2019-02-24 DIAGNOSIS — D224 Melanocytic nevi of scalp and neck: Secondary | ICD-10-CM | POA: Diagnosis not present

## 2019-02-24 DIAGNOSIS — D225 Melanocytic nevi of trunk: Secondary | ICD-10-CM | POA: Diagnosis not present

## 2019-02-24 DIAGNOSIS — D2371 Other benign neoplasm of skin of right lower limb, including hip: Secondary | ICD-10-CM | POA: Diagnosis not present

## 2019-02-24 DIAGNOSIS — L820 Inflamed seborrheic keratosis: Secondary | ICD-10-CM | POA: Diagnosis not present

## 2019-02-24 DIAGNOSIS — L821 Other seborrheic keratosis: Secondary | ICD-10-CM | POA: Diagnosis not present

## 2019-03-02 DIAGNOSIS — Z23 Encounter for immunization: Secondary | ICD-10-CM | POA: Diagnosis not present

## 2019-03-02 DIAGNOSIS — L259 Unspecified contact dermatitis, unspecified cause: Secondary | ICD-10-CM | POA: Diagnosis not present

## 2019-03-11 ENCOUNTER — Other Ambulatory Visit: Payer: Self-pay

## 2019-03-11 ENCOUNTER — Ambulatory Visit
Admission: RE | Admit: 2019-03-11 | Discharge: 2019-03-11 | Disposition: A | Discharge status: Home / Self Care | Payer: 59 | Source: Ambulatory Visit | Attending: Obstetrics & Gynecology | Admitting: Obstetrics & Gynecology

## 2019-03-11 DIAGNOSIS — Z1231 Encounter for screening mammogram for malignant neoplasm of breast: Secondary | ICD-10-CM | POA: Diagnosis not present

## 2019-03-27 DIAGNOSIS — Z20828 Contact with and (suspected) exposure to other viral communicable diseases: Secondary | ICD-10-CM | POA: Diagnosis not present

## 2019-04-12 DIAGNOSIS — Z20828 Contact with and (suspected) exposure to other viral communicable diseases: Secondary | ICD-10-CM | POA: Diagnosis not present

## 2019-04-26 ENCOUNTER — Other Ambulatory Visit: Payer: Self-pay | Admitting: Obstetrics & Gynecology

## 2019-04-26 NOTE — Telephone Encounter (Signed)
Medication refill request: Progestrone Last AEX:  04/17/18 Next AEX: 08/24/19 Last MMG (if hormonal medication request): 03/12/19 Bi-rads 1 neg  Refill authorized: #90 with 1 RF

## 2019-05-05 MED FILL — PROGESTERONE 100 MG CAPSULE: 100 | 90 days supply | Qty: 90 | Fill #0

## 2019-05-11 ENCOUNTER — Other Ambulatory Visit: Payer: Self-pay | Admitting: Obstetrics & Gynecology

## 2019-05-11 NOTE — Telephone Encounter (Signed)
Medication refill request: Vivelle-dot  Last AEX:  04-17-18 SM  Next AEX: 08-24-19 Last MMG (if hormonal medication request): 03-11-2019 density C/BIRADS 1 negative  Refill authorized: Today, please advise.   Medication pended for #24, 1RF. Please refill if appropriate.

## 2019-05-13 ENCOUNTER — Other Ambulatory Visit: Payer: Self-pay | Admitting: Obstetrics & Gynecology

## 2019-06-07 MED FILL — NITROGLYCERIN 0.2 MG/HR PTC: 0.2 | 28 days supply | Qty: 7 | Fill #1

## 2019-07-12 DIAGNOSIS — Z23 Encounter for immunization: Secondary | ICD-10-CM | POA: Diagnosis not present

## 2019-07-12 DIAGNOSIS — Z91030 Bee allergy status: Secondary | ICD-10-CM | POA: Diagnosis not present

## 2019-07-12 DIAGNOSIS — Z13220 Encounter for screening for lipoid disorders: Secondary | ICD-10-CM | POA: Diagnosis not present

## 2019-07-12 DIAGNOSIS — E559 Vitamin D deficiency, unspecified: Secondary | ICD-10-CM | POA: Diagnosis not present

## 2019-07-12 DIAGNOSIS — Z86010 Personal history of colonic polyps: Secondary | ICD-10-CM | POA: Diagnosis not present

## 2019-07-12 DIAGNOSIS — Z0000 Encounter for general adult medical examination without abnormal findings: Secondary | ICD-10-CM | POA: Diagnosis not present

## 2019-07-12 DIAGNOSIS — M85859 Other specified disorders of bone density and structure, unspecified thigh: Secondary | ICD-10-CM | POA: Diagnosis not present

## 2019-07-12 MED FILL — EPINEPHRINE 0.3 MG AUTO-INJ: 0.3 | 30 days supply | Qty: 2 | Fill #0

## 2019-08-24 ENCOUNTER — Encounter: Payer: Self-pay | Admitting: Obstetrics & Gynecology

## 2019-08-24 ENCOUNTER — Ambulatory Visit (INDEPENDENT_AMBULATORY_CARE_PROVIDER_SITE_OTHER): Payer: 59 | Admitting: Obstetrics & Gynecology

## 2019-08-24 ENCOUNTER — Other Ambulatory Visit: Payer: Self-pay

## 2019-08-24 VITALS — BP 104/60 | HR 76 | Temp 98.2°F | Ht 64.0 in | Wt 116.0 lb

## 2019-08-24 DIAGNOSIS — Z01419 Encounter for gynecological examination (general) (routine) without abnormal findings: Secondary | ICD-10-CM

## 2019-08-24 MED ORDER — ZOLPIDEM TARTRATE 5 MG PO TABS: 5.0000 mg | ORAL_TABLET | Freq: Every evening | ORAL | 0 refills | Status: DC | PRN

## 2019-08-24 MED FILL — ZOLPIDEM TARTRATE 5 MG TAB: 5 | 30 days supply | Qty: 30 | Fill #0

## 2019-08-24 NOTE — Patient Instructions (Signed)
When was last tdap? We have early 2021 in your chart.  What was your last Vit D?

## 2019-08-24 NOTE — Progress Notes (Signed)
64 y.o. G23P0103 Married White or Caucasian female here for annual exam.  Doing well.  Denies vaginal bleeding.  Off patches and progesterone.  Hot flashes are not as bad.  Does have some sleep issues.  She has tried some melatonin.  Sleep medication options discussed.  PCP:  Dr. Fara Olden.  Had appt in early 2021.  Did blood work and pt reports this was normal.         Patient's last menstrual period was 08/12/2011 (exact date).          Sexually active: Yes.    The current method of family planning is post menopausal status.    Exercising: Yes.    tennis, bootcamp, walking Smoker:  no  Health Maintenance: Pap:   04/17/18 Neg:Neg HR HPV  01/16/15 Neg. HR HPV:neg History of abnormal Pap:  yes MMG:  03/11/19 BIRADS 1 negative/density c Colonoscopy:  04/10/15 f/u 10 years BMD:   07/18/16 Osteopenia, -1.2 TDaP:  2021 Pneumonia vaccine(s):  no Shingrix:   completed Hep C testing: 2017 neg Screening Labs: PCP   reports that she has never smoked. She has never used smokeless tobacco. She reports current alcohol use. She reports that she does not use drugs.  Past Medical History:  Diagnosis Date  . Dyspareunia   . Endometriosis     Past Surgical History:  Procedure Laterality Date  . CESAREAN SECTION  1991  . LAPAROSCOPY  1989  . OVARIAN CYST REMOVAL  1988   benign- endometrioma- laparatomy then danazol 6 months  . SPINAL FUSION  2006   C5-6 rupture    Current Outpatient Medications  Medication Sig Dispense Refill  . Calcium Carbonate-Vitamin D (CALCIUM 500 + D PO) Take 1 tablet by mouth daily.    Marland Kitchen EPINEPHrine 0.3 mg/0.3 mL IJ SOAJ injection SMARTSIG:0.3 Milliliter(s) IM Once PRN    . ibuprofen (ADVIL,MOTRIN) 200 MG tablet Take 600 mg by mouth 3 (three) times daily as needed for pain.    . nitroGLYCERIN (NITRODUR - DOSED IN MG/24 HR) 0.2 mg/hr patch Use 1/4 patch daily to the affected area. 30 patch 1  . ALPRAZolam (XANAX) 0.25 MG tablet Take 1 tablet (0.25 mg total) by mouth at  bedtime as needed for sleep. (Patient not taking: Reported on 08/24/2019) 30 tablet 0  . Vitamin D, Ergocalciferol, (DRISDOL) 1.25 MG (50000 UT) CAPS capsule TAKE 1 CAPSULE BY MOUTH EVERY 7 DAYS ON MONDAYS (Patient not taking: Reported on 08/24/2019) 12 capsule 4  . zolpidem (AMBIEN) 5 MG tablet Take 1 tablet (5 mg total) by mouth at bedtime as needed for sleep. 30 tablet 0   No current facility-administered medications for this visit.    Family History  Problem Relation Age of Onset  . Diabetes Maternal Grandfather   . Heart disease Maternal Grandfather   . Polymyalgia rheumatica Father   . Renal Disease Father   . Hypertension Father   . Stroke Father        with memory loss  . Heart disease Paternal Grandfather   . Hypertension Mother   . Hyperlipidemia Mother   . Stroke Mother     Review of Systems  Constitutional:       Hot flashes Insomnia   All other systems reviewed and are negative.   Exam:   BP 104/60 (BP Location: Right Arm, Patient Position: Sitting, Cuff Size: Normal)   Pulse 76   Temp 98.2 F (36.8 C) (Temporal)   Ht 5\' 4"  (1.626 m)   Wt 116  lb (52.6 kg)   LMP 08/12/2011 (Exact Date)   BMI 19.91 kg/m     Height: 5\' 4"  (162.6 cm)  Ht Readings from Last 3 Encounters:  08/24/19 5\' 4"  (1.626 m)  02/02/19 5\' 4"  (1.626 m)  04/17/18 5\' 4"  (1.626 m)    General appearance: alert, cooperative and appears stated age Head: Normocephalic, without obvious abnormality, atraumatic Neck: no adenopathy, supple, symmetrical, trachea midline and thyroid normal to inspection and palpation Lungs: clear to auscultation bilaterally Breasts: normal appearance, no masses or tenderness Heart: regular rate and rhythm Abdomen: soft, non-tender; bowel sounds normal; no masses,  no organomegaly Extremities: extremities normal, atraumatic, no cyanosis or edema Skin: Skin color, texture, turgor normal. No rashes or lesions Lymph nodes: Cervical, supraclavicular, and axillary nodes  normal. No abnormal inguinal nodes palpated Neurologic: Grossly normal   Pelvic: External genitalia:  no lesions              Urethra:  normal appearing urethra with no masses, tenderness or lesions              Bartholins and Skenes: normal                 Vagina: normal appearing vagina with normal color and discharge, no lesions              Cervix: no lesions              Pap taken: No. Bimanual Exam:  Uterus:  normal size, contour, position, consistency, mobility, non-tender              Adnexa: normal adnexa and no mass, fullness, tenderness               Rectovaginal: Confirms               Anus:  normal sphincter tone, no lesions  Chaperone, Terence Lux, CMA, was present for exam.  A:  Well Woman with normal exam PMP, off HRT this past year H/o low Vit D Mild osteopenia Mild hot flashes and some sleep issues off HRT  P:   Mammogram guidelines reviewed pap smear with neg HR HPV 11/19 Colonoscopy is UTD Discussed repeating BMD in 2022/2023 Lab work done earlier this year with PCP Trial of Ambien 5mg , 1/2 to 1 tab po qhs prn insomnia #30/0RF.  She will let me know how this works.  Is only going to use if has insomnia for a few days in a row Return annually or prn

## 2019-08-26 ENCOUNTER — Encounter: Payer: Self-pay | Admitting: Obstetrics & Gynecology

## 2019-08-26 ENCOUNTER — Telehealth: Payer: Self-pay | Admitting: *Deleted

## 2019-08-26 NOTE — Telephone Encounter (Signed)
See telephone encounter dated 08/26/19.   Encounter closed.

## 2019-08-26 NOTE — Telephone Encounter (Signed)
Per review of 08/24/19 OV notes, patient to advise of Vit D level.   Routing to Dr. Sabra Heck to review and advise.   Aften, [facility]  Phone Number: [phone]  Dr Sabra Heck,  My tdap was on 07/12/19.  My Vitamin D level was 40.4 on the same day. That sounds like I don't need to take the prescription Vit D? What was the OTC that you recommended?  Thank you! Rachel Beasley

## 2019-08-27 NOTE — Telephone Encounter (Signed)
Spoke with patient. Advised of message as seen below from Dr.Miller. Patient verbalizes understanding. Encounter closed. 

## 2019-08-27 NOTE — Telephone Encounter (Signed)
Vit D recommendation would be 800 - 1000 IUs daily.  Thanks.

## 2019-09-24 MED FILL — NITROGLYCERIN 0.2 MG/HR PTC: 0.2 | 28 days supply | Qty: 7 | Fill #2

## 2019-11-01 DIAGNOSIS — H01004 Unspecified blepharitis left upper eyelid: Secondary | ICD-10-CM | POA: Diagnosis not present

## 2019-11-01 DIAGNOSIS — H00014 Hordeolum externum left upper eyelid: Secondary | ICD-10-CM | POA: Diagnosis not present

## 2019-11-24 DIAGNOSIS — H524 Presbyopia: Secondary | ICD-10-CM | POA: Diagnosis not present

## 2019-11-24 DIAGNOSIS — H5203 Hypermetropia, bilateral: Secondary | ICD-10-CM | POA: Diagnosis not present

## 2019-12-27 ENCOUNTER — Telehealth: Payer: Self-pay

## 2019-12-27 NOTE — Telephone Encounter (Signed)
Patient is calling in regards to discuss estrogen patch, Ambien prescription, and sleeping issues.

## 2019-12-28 NOTE — Telephone Encounter (Signed)
Patient is returning a call to Jill. °

## 2019-12-28 NOTE — Telephone Encounter (Signed)
Spoke with patient. Patient was seen for AEX 08/24/19, was prescribed Ambien prn for insomnia. Started the prescription late April or early May, works well, takes 5mg  on average 2-3 times per week.   Patient is asking if Ambien can be refilled or if she could go back on HRT? Advised I will review with Dr. Sabra Heck and f/u with recommendations, patient agreeable.   Routing to Dr. Sabra Heck to review and advise.

## 2019-12-28 NOTE — Telephone Encounter (Signed)
Left message to call Astria Jordahl, RN at [facility] [phone].   

## 2019-12-29 NOTE — Telephone Encounter (Signed)
Call returned to patient, left detailed message, ok per dpr. Advised per Dr. Sabra Heck, advised patient to return call to office to advise how she would like to proceed.

## 2019-12-29 NOTE — Telephone Encounter (Signed)
The decision is up to her but I am happy to refill her Lorrin Mais if she desires.  Thanks.

## 2019-12-30 ENCOUNTER — Other Ambulatory Visit: Payer: Self-pay | Admitting: Obstetrics & Gynecology

## 2019-12-30 ENCOUNTER — Telehealth (INDEPENDENT_AMBULATORY_CARE_PROVIDER_SITE_OTHER): Payer: 59 | Admitting: Obstetrics & Gynecology

## 2019-12-30 DIAGNOSIS — R232 Flushing: Secondary | ICD-10-CM | POA: Diagnosis not present

## 2019-12-30 DIAGNOSIS — G479 Sleep disorder, unspecified: Secondary | ICD-10-CM | POA: Diagnosis not present

## 2019-12-30 MED ORDER — GABAPENTIN 100 MG PO CAPS: ORAL_CAPSULE | ORAL | 0 refills | Status: DC

## 2019-12-30 MED ORDER — ESTRADIOL 0.1 MG/GM VA CREA: TOPICAL_CREAM | VAGINAL | 1 refills | Status: DC

## 2019-12-30 MED FILL — GABAPENTIN 100 MG CAPSULE: 100 | 23 days supply | Qty: 60 | Fill #0

## 2019-12-30 NOTE — Telephone Encounter (Signed)
Spoke with patient, patient would like to discuss HRT options with Dr. Sabra Heck prior to making a decision. MyChart visit scheduled for today at 4pm w/ Dr. Sabra Heck.   Routing to provider for final review. Patient is agreeable to disposition. Will close encounter.

## 2019-12-30 NOTE — Progress Notes (Signed)
Virtual Visit via Telephone Note  I connected with Rachel Beasley on 12/30/19 at  4:00 PM EDT by telephone and verified that I am speaking with the correct person using two identifiers.  Location: Patient: home Provider: office   We tried multiple times to connect via virtual video visit without success.  I ultimately called pt and we decided to continue conversation via phone call.  Pt agreed to proceed.  History of Present Illness: 64 yo G1P0103 MWF who desires to discuss hot flashes and sleep disturbance that has occurred since she stopped her HRT.  She has tried Ambien with success but she is concerned about being on this long term.  Wants to discussed possibly restarting HRT.  Risks and benefits reviewed including DVT, PE, stroke, MI and breast cancer.  Alternatives to this discussed including gabapentin and SSRIs/SNRIs specifically paxil or Effexor.  As sleep and hot flashes are her biggest issues, feel gabapentin would be a good option prior to starting HRT back and there are much lower risks.  Side effects reviewed.  Dosing discussed.  She is comfortable with trying this.   Observations/Objective: N/A  Assessment and Plan Hot flashes and night sweats that have occurred since stopping HRT  Follow Up Instructions: She is going to start with 100mg  gabapentin nightly and increase every 3 nights by 100mg .  She will give update if/when she gets to 300mg .  Rx to pharmacy.     I discussed the assessment and treatment plan with the patient. The patient was provided an opportunity to ask questions and all were answered. The patient agreed with the plan and demonstrated an understanding of the instructions.   The patient was advised to call back or seek an in-person evaluation if the symptoms worsen or if the condition fails to improve as anticipated.  I provided 22 minutes of non-face-to-face time during this encounter.   Megan Salon, MD

## 2019-12-31 MED FILL — ESTRADIOL 0.1 MG/GM CREA: 0.1 | 90 days supply | Qty: 43 | Fill #0

## 2020-01-01 ENCOUNTER — Encounter: Payer: Self-pay | Admitting: Obstetrics & Gynecology

## 2020-02-11 ENCOUNTER — Other Ambulatory Visit: Payer: Self-pay | Admitting: Obstetrics & Gynecology

## 2020-02-11 MED ORDER — GABAPENTIN 250 MG/5ML PO SOLN: 150.0000 mg | Freq: Every day | ORAL | 1 refills | Status: DC

## 2020-02-11 MED FILL — GABAPENTIN 250 MG/5ML SOLN: 250 | 90 days supply | Qty: 270 | Fill #0

## 2020-02-11 NOTE — Telephone Encounter (Signed)
Medication refill request: Gabapentin Last AEX:  08/24/19 SM Next AEX: 11/28/20 Last MMG (if hormonal medication request): n/a Refill authorized: Today, please advise  Spoke with patient for an update on medication, and patient states that with the 100mg , she still has trouble sleeping. When taking 200mg , she feels "groggy" in the mornings, so she has been taking 150mg . Per patient, Gabapentin has "definitely" helped with hot flashes.

## 2020-02-11 NOTE — Progress Notes (Signed)
Liquid gabapentin rx sent to pharmacy after confirming that pt should not open capsules and try to take 1/2 dosing.

## 2020-02-11 NOTE — Telephone Encounter (Signed)
Spoke with pharmacy regarding dosing of gabapentin capsules. Per pharmacist at [facility], patient should not take half of a capsule, as she could not be getting the right dosage. Gabapentin tablets only come in 600 and 800mg . Patient can however, take liquid gabapentin and dose it to where it equals 150mg . Please advise on prescription.

## 2020-02-28 ENCOUNTER — Other Ambulatory Visit: Payer: Self-pay | Admitting: Obstetrics & Gynecology

## 2020-02-28 DIAGNOSIS — Z1231 Encounter for screening mammogram for malignant neoplasm of breast: Secondary | ICD-10-CM

## 2020-02-29 DIAGNOSIS — H00011 Hordeolum externum right upper eyelid: Secondary | ICD-10-CM | POA: Diagnosis not present

## 2020-02-29 MED FILL — ERYTHROMYCIN EYE OINTMENT: 5 | 7 days supply | Qty: 4 | Fill #0

## 2020-02-29 MED FILL — DOXYCYCLINE HYCLATE 100 MG: 100 | 10 days supply | Qty: 20 | Fill #0

## 2020-03-01 DIAGNOSIS — D224 Melanocytic nevi of scalp and neck: Secondary | ICD-10-CM | POA: Diagnosis not present

## 2020-03-01 DIAGNOSIS — D225 Melanocytic nevi of trunk: Secondary | ICD-10-CM | POA: Diagnosis not present

## 2020-03-01 DIAGNOSIS — L821 Other seborrheic keratosis: Secondary | ICD-10-CM | POA: Diagnosis not present

## 2020-03-01 DIAGNOSIS — L819 Disorder of pigmentation, unspecified: Secondary | ICD-10-CM | POA: Diagnosis not present

## 2020-03-01 DIAGNOSIS — L820 Inflamed seborrheic keratosis: Secondary | ICD-10-CM | POA: Diagnosis not present

## 2020-03-01 DIAGNOSIS — L814 Other melanin hyperpigmentation: Secondary | ICD-10-CM | POA: Diagnosis not present

## 2020-03-06 ENCOUNTER — Telehealth: Payer: Self-pay

## 2020-03-06 ENCOUNTER — Other Ambulatory Visit: Payer: Self-pay

## 2020-03-06 ENCOUNTER — Ambulatory Visit (INDEPENDENT_AMBULATORY_CARE_PROVIDER_SITE_OTHER): Payer: 59 | Admitting: Sports Medicine

## 2020-03-06 ENCOUNTER — Other Ambulatory Visit: Payer: Self-pay | Admitting: Sports Medicine

## 2020-03-06 VITALS — BP 102/62 | Ht 64.0 in | Wt 115.0 lb

## 2020-03-06 DIAGNOSIS — M25559 Pain in unspecified hip: Secondary | ICD-10-CM | POA: Diagnosis not present

## 2020-03-06 MED ORDER — PREDNISONE 10 MG PO TABS: ORAL_TABLET | ORAL | 0 refills | Status: DC

## 2020-03-06 MED FILL — predniSONE 10 MG TABS: 10 | 6 days supply | Qty: 21 | Fill #0

## 2020-03-06 NOTE — Telephone Encounter (Signed)
Patient would like to speak with nurse about her gabapentin prescription.

## 2020-03-06 NOTE — Telephone Encounter (Signed)
Left message for pt to return call to triage RN. 

## 2020-03-07 NOTE — Progress Notes (Signed)
Subjective:    Patient ID: Rachel Beasley, female    DOB: 04-Nov-1955, 64 y.o.   MRN: [id number]  HPI chief complaint: Left hip and right wrist pain  Byron comes in today with a couple of different complaints.  She is complaining of some lateral and posterior lateral hip discomfort and tightness.  She was seen in the office about a year ago with piriformis syndrome but her current pain is different than what she experienced at that time.  She does get some discomfort down the lateral aspect of her leg to the knee.  She denies any groin pain.  She is leaving in a few days for an overseas trip and will be doing quite a bit of hiking.  She has had some success with dry needling in the past but has not tried that for her current hip discomfort.  She denies any known trauma.  She is also complaining of 2 weeks of right hand pain and swelling.  Symptoms began suddenly 1 morning when upon awakening she noticed swelling in her fingers and discomfort primarily along the ulnar aspect of her hand.  Her symptoms are slowly improving.  She has not noticed any weakness with activities of daily living but did have a couple of episodes where she dropped her tennis racquet but they seem to be isolated incidents.  She denies numbness or tingling.  No similar symptoms in the past.  Past medical history reviewed Medications reviewed Allergies reviewed   Review of Systems    As above Objective:   Physical Exam  Well-developed, fit appearing.  No acute distress.  Awake alert and oriented x3.  Vital signs reviewed  Left hip: Smooth painless hip range of motion with a negative logroll.  There is no tenderness to palpation over the greater trochanteric bursa.  There is some tenderness along the tensor fascia lata as well as at the insertion of the gluteus medius and gluteus minimus tendons.  Mildly positive Trendelenburg.  4+/5 strength with resisted hip abduction compared to 5/5 on the right.  Neurovascularly intact  distally.  Right wrist: Full range of motion.  No effusion.  No soft tissue swelling.  Negative Tinel's.  Negative Phalen's.  Good pulses.  Good grip strength.  No pain with liftoff.  No atrophy.  Limited bedside ultrasound of the right wrist shows a normal median nerve.  There is some slight spurring off of the distal ulna but TFCC appears to be unremarkable.      Assessment & Plan:   Left hip pain secondary to tensor fascia lata strain/gluteus medius tendinitis Right wrist pain and swelling of unknown etiology  For the left hip, we will give her some home exercises to work on hip abductor strengthening and pelvic stabilization.  We will also give her some IT band stretches.  The etiology of her right wrist pain and swelling is unknown at this time but her symptoms appear to be resolving.  It may be some sort of ulnar nerve irritation or atypical carpal tunnel but her ultrasound of her median nerve today is unremarkable.  Since she is leaving in a few days for an overseas trip I have given her a prescription for prednisone to take with her.  She will take it only if needed.  If she continues to have hip pain upon her return to the [place], I would recommend formal physical therapy.  If her right wrist pain does not continue to improve then further evaluation with MRI  or nerve conduction study may be necessary.  Follow-up for ongoing or recalcitrant issues.

## 2020-03-08 ENCOUNTER — Ambulatory Visit: Payer: 59

## 2020-03-08 NOTE — Telephone Encounter (Signed)
Spoke with pt. Pt states taking liquid gabapentin Rx that has to be refrigerated. Pt states going on a trip to [place] from 10/13-26. Pt unsure if hotel/place staying will have refrigerators in room for storage. Pt states 150mg /70ml gabapentin has been working well and denies any vasomotor sx. Pt wanting to know if has to keep refrigerated or needs a different Rx?   Pt also asking if can take Ambien and gabapentin together? Pt states takes Ambien PRN and especially on long trips. Pt would like RF as well to last through trip. Last Rx RF was 08/2019.  Advised pt will review with Dr Sabra Heck and return call. Pt agreeable and thankful for advice.   Routing to Dr Sabra Heck.

## 2020-03-08 NOTE — Telephone Encounter (Signed)
Left message for pt to return call to triage RN. 

## 2020-03-10 ENCOUNTER — Other Ambulatory Visit: Payer: Self-pay | Admitting: Obstetrics & Gynecology

## 2020-03-10 MED ORDER — ZOLPIDEM TARTRATE 5 MG PO TABS: 5.0000 mg | ORAL_TABLET | Freq: Every evening | ORAL | 0 refills | Status: DC | PRN

## 2020-03-10 MED ORDER — GABAPENTIN 100 MG PO CAPS: ORAL_CAPSULE | ORAL | 0 refills | Status: DC

## 2020-03-10 MED FILL — ZOLPIDEM TARTRATE 5 MG TAB: 5 | 30 days supply | Qty: 30 | Fill #0

## 2020-03-10 MED FILL — GABAPENTIN 100 MG CAPSULE: 100 | 15 days supply | Qty: 30 | Fill #0

## 2020-03-10 NOTE — Telephone Encounter (Signed)
Left message for pt to return call to triage RN. 

## 2020-03-10 NOTE — Telephone Encounter (Signed)
Spoke with pt. Pt given recommendations per Dr Sabra Heck. Pt states needs refills of 100 mg capsules to get through vacation. Pt agreeable and verbalized understanding to not take Ambien and Gabapentin together. Pt thankful for advice and refill.  Rx Gabapentin 100 mg caps # 30, 0RF sent to pharmacy on file.  Encounter closed.

## 2020-03-10 NOTE — Telephone Encounter (Signed)
1)  She probably should take the oral 100mg  gabapentin capsules while traveling.  Ok to take 100 or 200mg  nightly as she is using the 150mg  liquid dosing for her hot flashes.  She should not open the capsules.  If needs RF, ok to send in #30/0RF.  2)  She should not take the gabapentin with Ambien.  I have done this RF but don't combine it.  Please let her know we went to [place], the [facility] and [place] a few years ago and it was a great trip.  Hope she enjoys her trip.

## 2020-03-13 DIAGNOSIS — Z20822 Contact with and (suspected) exposure to covid-19: Secondary | ICD-10-CM | POA: Diagnosis not present

## 2020-03-14 MED FILL — ESTRADIOL 0.1 MG/GM CREA: 0.1 | 90 days supply | Qty: 43 | Fill #1

## 2020-03-28 ENCOUNTER — Ambulatory Visit: Payer: 59

## 2020-04-20 DIAGNOSIS — Z681 Body mass index (BMI) 19 or less, adult: Secondary | ICD-10-CM | POA: Diagnosis not present

## 2020-04-20 DIAGNOSIS — M2550 Pain in unspecified joint: Secondary | ICD-10-CM | POA: Diagnosis not present

## 2020-04-20 DIAGNOSIS — M79641 Pain in right hand: Secondary | ICD-10-CM | POA: Diagnosis not present

## 2020-05-01 ENCOUNTER — Ambulatory Visit
Admission: RE | Admit: 2020-05-01 | Discharge: 2020-05-01 | Disposition: A | Discharge status: Home / Self Care | Payer: 59 | Source: Ambulatory Visit | Attending: Obstetrics & Gynecology | Admitting: Obstetrics & Gynecology

## 2020-05-01 ENCOUNTER — Other Ambulatory Visit: Payer: Self-pay

## 2020-05-01 DIAGNOSIS — Z1231 Encounter for screening mammogram for malignant neoplasm of breast: Secondary | ICD-10-CM

## 2020-05-03 ENCOUNTER — Ambulatory Visit: Payer: 59

## 2020-05-04 ENCOUNTER — Other Ambulatory Visit: Payer: Self-pay | Admitting: Obstetrics & Gynecology

## 2020-05-04 ENCOUNTER — Telehealth: Payer: Self-pay

## 2020-05-04 MED ORDER — GABAPENTIN 100 MG PO CAPS: 200.0000 mg | ORAL_CAPSULE | Freq: Every day | ORAL | 1 refills | Status: DC

## 2020-05-04 MED FILL — GABAPENTIN 100 MG CAPSULE: 100 | 45 days supply | Qty: 90 | Fill #0

## 2020-05-04 NOTE — Telephone Encounter (Signed)
Patient would like to discuss medication Gabapentin with nurse.

## 2020-05-04 NOTE — Telephone Encounter (Signed)
Spoke with pt. Pt states has been using 200mg  of gabapentin since Oct and working well for vasomotor sx and would like to continue taking. Pt ok with capsules now since back at 200mg . Pt will be out of Rx in 1 week.  Pharmacy verified.  Routing to Dr Sabra Heck for approval of Rx.  Rx pended for # 90, 1 RF. Pt is due to AEX in March 2022. Pt to call [facility] to make appt.

## 2020-05-04 NOTE — Telephone Encounter (Signed)
Rx completed.  Thanks for the message.

## 2020-06-06 ENCOUNTER — Ambulatory Visit (INDEPENDENT_AMBULATORY_CARE_PROVIDER_SITE_OTHER): Payer: 59 | Admitting: Sports Medicine

## 2020-06-06 ENCOUNTER — Other Ambulatory Visit: Payer: Self-pay

## 2020-06-06 DIAGNOSIS — M79672 Pain in left foot: Secondary | ICD-10-CM | POA: Diagnosis not present

## 2020-06-06 NOTE — Progress Notes (Signed)
Rachel Beasley - 65 y.o. female MRN [id number]  Date of birth: 1955-10-06  SUBJECTIVE:   CC: L foot pain   Ms. Telford is a 65 year old female presenting for evaluation of a 1 month history of left foot pain.  Present at the base of her second toe on the dorsal and sole of her foot.  She has a history of metatarsalgia several years ago and wears metatarsal pads in her insoles.  She is not sure if this feels similar.  Pain is the worst when she walks on a hardwood floor or with flexion/extension of her toes (mountain climber's, jumping in her boot camp class).  Achy in nature.  Other than wearing her inserts which does make it feel better, she has not tried anything else.  Denies any associated weakness, numbness/tingling.  Feels like she has noticed some swelling in her foot in this area only.  In addition to her boot camp class 3 times a week, she plays tennis and does some walking.  PMHx - Updated and reviewed.   Social Hx - Updated and reviewed. Contributory factors include: Negative Medications - reviewed   PHYSICAL EXAM:  VS: BP:96/60  HR: bpm  TEMP: ( )  RESP:   HT:5\' 4"  (162.6 cm)   WT:116 lb (52.6 kg)  BMI:19.9 PHYSICAL EXAM: Gen: NAD, alert, cooperative with exam, well-appearing Resp: non-labored Skin: no rashes, normal turgor  Psych:  alert and oriented  Foot: Inspection:  No obvious bony deformity.  No erythema, or bruising.  Can notice some mild soft tissue swelling on dorsal portion around second metatarsal.  Pes cavus present. Palpation: Tenderness to palpation along dorsal aspect of distal second metatarsal and on sole of foot around same location. ROM: Full ROM of the ankle. Normal midfoot flexibility. Strength: 5/5 strength ankle in all planes Neurovascular: N/V intact distally in the lower extremity Special tests: Negative anterior drawer. Negative squeeze. normal midfoot flexibility.  Limited foot U/S: No bone spurring/evidence of osteoarthritis within proximal  metatarsal-phalangeal joint.  Do note some neovascular changes with hypoechoic region overlying the distal second metatarsal.   ASSESSMENT & PLAN:   Stress reaction of the second distal metatarsal: Acute.  Pain distribution and abnormality seen on U/S consistent with stress reaction of her distal second metatarsal.  Unclear if concurrent stress fracture present, however reassuringly no evidence of this on U/S and with minimal functional impairment currently.  Doubt OA per U/S findings. During visit, fitted updated shoe inserts with metatarsal pad.  Encouraged appropriate shoe support at all times.  Recommended limiting all activities with frequent moving of second metatarsal, including avoidance of her boot camp for the next few weeks to allow this area to heal.  May use Tylenol/ice PRN.   Follow-up in 4 weeks or sooner if needed, will repeat foot U/S at that time.  Allayne Stack, DO  Family Medicine PGY-3   Patient seen and evaluated with the resident.  I agree with the above plan of care.  Patient has some mild swelling on the dorsum of her foot suggestive of a possible second metatarsal stress reaction.  Ultrasound also confirms this with neovascularity and a small amount of fluid In the distal second metatarsal near the metatarsal head.  No obvious cortical irregularity is seen.  Patient is able to ambulate without a limp when wearing a cushioned shoe.  She is advised to discontinue her boot camp workouts and follow-up in 4 weeks for repeat ultrasound and reevaluation.  I do not feel like  we need to immobilize this at this point in time but if her symptoms worsen and she develops more of a limp then I may need to reconsider that.  Today, we replaced her old green sports insoles with metatarsal pads with new ones.  Of note, I decided to leave off the larger metatarsal bar which was on the bottom of her old left orthotic.  I may reapply this later when she has recovered from her injury.

## 2020-06-06 NOTE — Patient Instructions (Signed)
It was so wonderful to meet you today.  We believe that you likely have a stress reaction of your second metatarsal bone (second toe).  We have updated your insoles and metatarsal pad.  Please make sure you are wearing shoes or cushion at all times and avoid walking on hardwood floor.  As we discussed, please stop doing boot camp for about the next month.  In addition to this, try to avoid all activities with bending of your toes as this seems to make it worse/let this area heal.  You can also use ice 20 minutes at a time several times a day if you would like to help with any discomfort.  Tylenol 650 mg every 4-6 hours is also fine to do.

## 2020-06-07 ENCOUNTER — Encounter: Payer: Self-pay | Admitting: Sports Medicine

## 2020-06-27 ENCOUNTER — Other Ambulatory Visit: Payer: Self-pay | Admitting: Obstetrics & Gynecology

## 2020-06-27 MED FILL — GABAPENTIN 100 MG CAPSULE: 100 | 45 days supply | Qty: 90 | Fill #1

## 2020-07-04 ENCOUNTER — Other Ambulatory Visit: Payer: Self-pay

## 2020-07-04 ENCOUNTER — Ambulatory Visit (INDEPENDENT_AMBULATORY_CARE_PROVIDER_SITE_OTHER): Payer: 59 | Admitting: Sports Medicine

## 2020-07-04 VITALS — BP 110/62 | Ht 64.0 in | Wt 113.0 lb

## 2020-07-04 DIAGNOSIS — M79672 Pain in left foot: Secondary | ICD-10-CM

## 2020-07-05 NOTE — Progress Notes (Signed)
   Subjective:    Patient ID: Rachel Beasley, female    DOB: 1956-02-08, 65 y.o.   MRN: [id number]  HPI   Patient comes in today for follow-up on left foot pain likely secondary to early stress reaction/stress fracture of the distal second metatarsal.  Overall, she is improving but is not pain-free.  The swelling on the dorsum of her foot has resolved.  She is still experiencing intermittent pain on the plantar aspect of her foot.  She has not returned to boot camp.  She did return to playing tennis but eventually had returning pain with this to so he has discontinued it for the time being.  She has an upcoming trip to [place] in 2 weeks.  She brought with her today several pairs of stiff soled tennis shoes.    Review of Systems     Objective:   Physical Exam  Well-developed, well-nourished.  No acute distress  Left foot: Previous swelling on the dorsum of the foot has resolved.  She does still have some slight tenderness to palpation at the distal second metatarsal and pain with metatarsal squeezing.  Good pulses.  Limited MSK ultrasound of the left foot shows resolution of the previous increased neovascularity at the distal second metatarsal.  Fluid Is also resolved.      Assessment & Plan:   Improving foot pain likely secondary to distal second metatarsal stress reaction/stress fracture  Patient is improving.  I think it may take another 4 weeks or so for complete symptom resolution.  I did recommend that she purchase a postop shoe and take that with her on her overseas trip.  This is only a precaution so that she will have something other than a tennis shoe to wear if her foot starts hurting again.  Having said that, her current tennis shoes are very supportive and quite stiff so she may do well with those instead.  If she does experience increasing pain with her overseas trip then she will return to the office for reevaluation and consideration of further work-up.  Otherwise, she may  follow-up as needed.

## 2020-07-13 DIAGNOSIS — Z20822 Contact with and (suspected) exposure to covid-19: Secondary | ICD-10-CM | POA: Diagnosis not present

## 2020-07-17 DIAGNOSIS — U071 COVID-19: Secondary | ICD-10-CM | POA: Diagnosis not present

## 2020-08-07 ENCOUNTER — Ambulatory Visit (INDEPENDENT_AMBULATORY_CARE_PROVIDER_SITE_OTHER): Payer: 59 | Admitting: Sports Medicine

## 2020-08-07 ENCOUNTER — Other Ambulatory Visit: Payer: Self-pay

## 2020-08-07 ENCOUNTER — Ambulatory Visit
Admission: RE | Admit: 2020-08-07 | Discharge: 2020-08-07 | Disposition: A | Discharge status: Home / Self Care | Payer: 59 | Source: Ambulatory Visit | Attending: Sports Medicine | Admitting: Sports Medicine

## 2020-08-07 VITALS — BP 120/58 | Ht 64.0 in | Wt 113.0 lb

## 2020-08-07 DIAGNOSIS — M79672 Pain in left foot: Secondary | ICD-10-CM | POA: Diagnosis not present

## 2020-08-07 DIAGNOSIS — M25572 Pain in left ankle and joints of left foot: Secondary | ICD-10-CM | POA: Diagnosis not present

## 2020-08-07 NOTE — Progress Notes (Signed)
   Subjective:    Patient ID: Rachel Beasley, female    DOB: 06/18/1955, 65 y.o.   MRN: [id number]  HPI   Brayah comes in today for follow-up on left foot pain.  She states that the pain continues to improve but has not resolved.  She also states that there have been several occurrences where she has had an acute worsening of her discomfort.  She continues to localize her pain to the distal second metatarsal.  She has not had any return swelling.  She was not able to travel overseas because she contracted COVID-19.  She is now planning to travel at the end of this week.    Review of Systems As above    Objective:   Physical Exam  Well-developed, well-nourished.  No acute distress.  Left foot: No obvious swelling.  There is still some tenderness to palpation along the second metatarsal distally.  No tenderness to palpation directly over the metatarsal head.  No joint effusion at the second MTP.  Good pulses.  X-rays of the left foot including AP, lateral, and oblique view show no obvious fracture or stress fracture      Assessment & Plan:   Left foot pain, possibly secondary to healing stress reaction  Although the patient is making some improvement, she is not improving as rapidly as I would expect with a stress injury to the second metatarsal.  I discussed proceeding with an MRI to evaluate further but we have decided to wait another week or two to see how things progress.  She understands that if her symptoms do not continue to improve, then we will need to reconsider MRI.  Follow-up as needed.

## 2020-08-08 ENCOUNTER — Ambulatory Visit: Payer: 59 | Admitting: Sports Medicine

## 2020-08-21 ENCOUNTER — Other Ambulatory Visit: Payer: Self-pay | Admitting: *Deleted

## 2020-08-21 DIAGNOSIS — M25572 Pain in left ankle and joints of left foot: Secondary | ICD-10-CM

## 2020-08-28 ENCOUNTER — Encounter (HOSPITAL_BASED_OUTPATIENT_CLINIC_OR_DEPARTMENT_OTHER): Payer: Self-pay | Admitting: Obstetrics & Gynecology

## 2020-08-28 ENCOUNTER — Other Ambulatory Visit: Payer: Self-pay

## 2020-08-28 ENCOUNTER — Ambulatory Visit (INDEPENDENT_AMBULATORY_CARE_PROVIDER_SITE_OTHER): Payer: 59 | Admitting: Obstetrics & Gynecology

## 2020-08-28 ENCOUNTER — Other Ambulatory Visit (HOSPITAL_COMMUNITY)
Admission: RE | Admit: 2020-08-28 | Discharge: 2020-08-28 | Disposition: A | Discharge status: Home / Self Care | Payer: 59 | Source: Ambulatory Visit | Attending: Obstetrics & Gynecology | Admitting: Obstetrics & Gynecology

## 2020-08-28 ENCOUNTER — Other Ambulatory Visit (HOSPITAL_BASED_OUTPATIENT_CLINIC_OR_DEPARTMENT_OTHER): Payer: Self-pay | Admitting: Obstetrics & Gynecology

## 2020-08-28 VITALS — BP 101/61 | HR 71 | Ht 63.75 in | Wt 115.0 lb

## 2020-08-28 DIAGNOSIS — Z860100 Personal history of colon polyps, unspecified: Secondary | ICD-10-CM | POA: Insufficient documentation

## 2020-08-28 DIAGNOSIS — F5101 Primary insomnia: Secondary | ICD-10-CM | POA: Diagnosis not present

## 2020-08-28 DIAGNOSIS — Z780 Asymptomatic menopausal state: Secondary | ICD-10-CM

## 2020-08-28 DIAGNOSIS — Z79890 Hormone replacement therapy (postmenopausal): Secondary | ICD-10-CM

## 2020-08-28 DIAGNOSIS — Z124 Encounter for screening for malignant neoplasm of cervix: Secondary | ICD-10-CM

## 2020-08-28 DIAGNOSIS — E559 Vitamin D deficiency, unspecified: Secondary | ICD-10-CM | POA: Insufficient documentation

## 2020-08-28 DIAGNOSIS — M8580 Other specified disorders of bone density and structure, unspecified site: Secondary | ICD-10-CM | POA: Diagnosis not present

## 2020-08-28 DIAGNOSIS — Z01419 Encounter for gynecological examination (general) (routine) without abnormal findings: Secondary | ICD-10-CM

## 2020-08-28 MED ORDER — GABAPENTIN 100 MG PO CAPS: 300.0000 mg | ORAL_CAPSULE | Freq: Every day | ORAL | 4 refills | Status: DC

## 2020-08-28 MED ORDER — ESTRADIOL 0.1 MG/GM VA CREA: TOPICAL_CREAM | VAGINAL | 3 refills | Status: DC

## 2020-08-28 MED ORDER — PROGESTERONE MICRONIZED 100 MG PO CAPS: 100.0000 mg | ORAL_CAPSULE | Freq: Every day | ORAL | 4 refills | Status: DC

## 2020-08-28 MED FILL — PROGESTERONE 100 MG CAPSULE: 100 | 90 days supply | Qty: 90 | Fill #0

## 2020-08-28 NOTE — Patient Instructions (Signed)
Pneumovax pneumonia vaccination.

## 2020-08-28 NOTE — Progress Notes (Signed)
65 y.o. [id number] Married White or Caucasian female here for annual exam.  Doing well.  She is having some issues with sleep.  Using gabapentin and melatonin for sleep.  200mg  gabapentin doesn't seem to be working a well as it did earlier this year.  Feels like she gets "resistent" to medication and needs to increase dosage.  She continues to have hot flashes.  Is frustrated with how poor her sleep is.  Does use Ambien on occasion as well.  We discussed risks and benefits of restarting low dosed HRT and just staying on this as her quality of life was much better especially in regards to sleep.    Does blood work with Dr. Fara Olden.  Is seen yearly.  Patient's last menstrual period was 08/12/2011 (exact date).          Sexually active: Yes.    The current method of family planning is post menopausal status.    Exercising: Yes.    tennis and boot camp Smoker:  yes  Health Maintenance: Pap:  today History of abnormal Pap:  Yes, remote hx MMG:  04/2020 Colonoscopy:  2016, hyperplastic polyp BMD:   01/2019, -1.3 TDaP:  2011 Pneumonia vaccine(s):  Discussed with Shingrix:   completed Hep C testing: 01/17/2016 Screening Labs: with PCP   reports that she has never smoked. She has never used smokeless tobacco. She reports current alcohol use. She reports that she does not use drugs.  Past Medical History:  Diagnosis Date  . Dyspareunia   . Endometriosis     Past Surgical History:  Procedure Laterality Date  . CESAREAN SECTION  1991  . LAPAROSCOPY  1989  . OVARIAN CYST REMOVAL  1988   benign- endometrioma- laparatomy then danazol 6 months  . SPINAL FUSION  2006   C5-6 rupture    Current Outpatient Medications  Medication Sig Dispense Refill  . Calcium Carbonate-Vitamin D (CALCIUM 500 + D PO) Take 1 tablet by mouth daily.    Marland Kitchen EPINEPHrine 0.3 mg/0.3 mL IJ SOAJ injection SMARTSIG:0.3 Milliliter(s) IM Once PRN    . ibuprofen (ADVIL,MOTRIN) 200 MG tablet Take 600 mg by mouth 3 (three)  times daily as needed for pain.    . progesterone (PROMETRIUM) 100 MG capsule Take 1 capsule (100 mg total) by mouth daily. 90 capsule 4  . zolpidem (AMBIEN) 5 MG tablet Take 1 tablet (5 mg total) by mouth at bedtime as needed for sleep. 30 tablet 0  . estradiol (ESTRACE) 0.1 MG/GM vaginal cream 1 gram vaginally twice weekly 42.5 g 3  . gabapentin (NEURONTIN) 100 MG capsule Take 3 capsules (300 mg total) by mouth at bedtime. 90 capsule 4   No current facility-administered medications for this visit.    Family History  Problem Relation Age of Onset  . Diabetes Maternal Grandfather   . Heart disease Maternal Grandfather   . Polymyalgia rheumatica Father   . Renal Disease Father   . Hypertension Father   . Stroke Father        with memory loss  . Heart disease Paternal Grandfather   . Hypertension Mother   . Hyperlipidemia Mother   . Stroke Mother     Review of Systems  All other systems reviewed and are negative.   Exam:   BP 101/61   Pulse 71   Ht 5' 3.75" (1.619 m)   Wt 115 lb (52.2 kg)   LMP 08/12/2011 (Exact Date)   BMI 19.89 kg/m   Height: 5' 3.75" (161.9 cm)  General appearance: alert, cooperative and appears stated age Head: Normocephalic, without obvious abnormality, atraumatic Neck: no adenopathy, supple, symmetrical, trachea midline and thyroid normal to inspection and palpation Lungs: clear to auscultation bilaterally Breasts: normal appearance, no masses or tenderness Heart: regular rate and rhythm Abdomen: soft, non-tender; bowel sounds normal; no masses,  no organomegaly Extremities: extremities normal, atraumatic, no cyanosis or edema Skin: Skin color, texture, turgor normal. No rashes or lesions Lymph nodes: Cervical, supraclavicular, and axillary nodes normal. No abnormal inguinal nodes palpated Neurologic: Grossly normal   Pelvic: External genitalia:  no lesions              Urethra:  normal appearing urethra with no masses, tenderness or lesions               Bartholins and Skenes: normal                 Vagina: normal appearing vagina with normal color and discharge, no lesions              Cervix: no lesions              Pap taken: Yes.   Bimanual Exam:  Uterus:  normal size, contour, position, consistency, mobility, non-tender              Adnexa: normal adnexa and no mass, fullness, tenderness               Rectovaginal: Confirms               Anus:  normal sphincter tone, no lesions  Chaperone, Prince Rome, CMA, was present for exam.  Assessment/Plan: 1. Well woman exam with routine gynecological exam - pap obtained today - MMG 04/2020 - colonoscopy 2016, hyperplastic polyp present - BMD 01/2019 with mild osteopenia - vaccines reviewed.  Pt needs pneumovax.  Advised where to get it.  Tdap due as well. - lab work done with Dr. Clayton Bibles yearly.  2. Postmenopausal  3. Osteopenia, unspecified location - plan repeat BMD 1-2 more years  4. Vitamin D deficiency - lab work followed by PCP  5. Personal history of colonic polyps  6. Hormone replacement therapy (HRT) - we discussed risks/benefits of restarting.  She is going to start with progesterone only for about a month to see how she feels.  If this helps, she will not start the estradiol patch.  She will give me an update - estradiol (ESTRACE) 0.1 MG/GM vaginal cream; 1 gram vaginally twice weekly  Dispense: 42.5 g; Refill: 3 - progesterone (PROMETRIUM) 100 MG capsule; Take 1 capsule (100 mg total) by mouth daily.  Dispense: 90 capsule; Refill: 4  8. Primary insomnia - she will consider using the Prometrium with the gabapentin.  She is aware she can increase this dosage as well.  If decides to restart estradiol, she will stop the gabapentin - gabapentin (NEURONTIN) 100 MG capsule; Take 3 capsules (300 mg total) by mouth at bedtime.  Dispense: 90 capsule; Refill: 4

## 2020-08-29 ENCOUNTER — Ambulatory Visit
Admission: RE | Admit: 2020-08-29 | Discharge: 2020-08-29 | Disposition: A | Discharge status: Home / Self Care | Payer: 59 | Source: Ambulatory Visit | Attending: Sports Medicine | Admitting: Sports Medicine

## 2020-08-29 DIAGNOSIS — M19072 Primary osteoarthritis, left ankle and foot: Secondary | ICD-10-CM | POA: Diagnosis not present

## 2020-08-29 DIAGNOSIS — M25572 Pain in left ankle and joints of left foot: Secondary | ICD-10-CM

## 2020-08-29 DIAGNOSIS — F5101 Primary insomnia: Secondary | ICD-10-CM | POA: Insufficient documentation

## 2020-08-29 DIAGNOSIS — M8580 Other specified disorders of bone density and structure, unspecified site: Secondary | ICD-10-CM | POA: Insufficient documentation

## 2020-08-30 ENCOUNTER — Other Ambulatory Visit: Payer: Self-pay | Admitting: Obstetrics & Gynecology

## 2020-08-30 ENCOUNTER — Other Ambulatory Visit (HOSPITAL_COMMUNITY): Payer: Self-pay | Admitting: Internal Medicine

## 2020-08-30 DIAGNOSIS — Z86010 Personal history of colonic polyps: Secondary | ICD-10-CM | POA: Diagnosis not present

## 2020-08-30 DIAGNOSIS — M778 Other enthesopathies, not elsewhere classified: Secondary | ICD-10-CM | POA: Diagnosis not present

## 2020-08-30 DIAGNOSIS — E559 Vitamin D deficiency, unspecified: Secondary | ICD-10-CM | POA: Diagnosis not present

## 2020-08-30 DIAGNOSIS — Z0000 Encounter for general adult medical examination without abnormal findings: Secondary | ICD-10-CM | POA: Diagnosis not present

## 2020-08-30 DIAGNOSIS — Z91030 Bee allergy status: Secondary | ICD-10-CM | POA: Diagnosis not present

## 2020-08-30 DIAGNOSIS — Z136 Encounter for screening for cardiovascular disorders: Secondary | ICD-10-CM | POA: Diagnosis not present

## 2020-08-30 LAB — CYTOLOGY - PAP
Comment: NEGATIVE
Diagnosis: NEGATIVE
High risk HPV: NEGATIVE

## 2020-08-31 ENCOUNTER — Encounter (HOSPITAL_BASED_OUTPATIENT_CLINIC_OR_DEPARTMENT_OTHER): Payer: Self-pay

## 2020-09-02 ENCOUNTER — Other Ambulatory Visit (HOSPITAL_COMMUNITY): Payer: Self-pay

## 2020-09-02 MED FILL — Epinephrine Solution Auto-injector 0.3 MG/0.3ML (1:1000): INTRAMUSCULAR | 2 days supply | Qty: 2 | Fill #0 | Status: AC

## 2020-09-04 ENCOUNTER — Telehealth: Payer: Self-pay | Admitting: Sports Medicine

## 2020-09-04 NOTE — Telephone Encounter (Signed)
  I spoke with Rachel Beasley today on the phone after reviewing the MRI of her left foot.  It is an unremarkable study other than some minimal first MTP osteoarthritis.  No evidence of stress fracture or stress reaction.  She is reassured of these findings.  I do believe that her symptoms began as a stress reaction based on her initial ultrasound findings and dorsal foot swelling but that has since healed.  She has started to return to boot camp and running with minimal discomfort.  I do think that cushioning and arch support is going to be important for her.  She will continue to experiment with this on her own.  We did talk about the possibility of custom orthotics if her symptoms do not completely resolve.  Follow-up as needed.

## 2020-09-05 ENCOUNTER — Other Ambulatory Visit (HOSPITAL_COMMUNITY): Payer: Self-pay

## 2020-09-15 ENCOUNTER — Other Ambulatory Visit (HOSPITAL_COMMUNITY): Payer: Self-pay

## 2020-09-15 ENCOUNTER — Other Ambulatory Visit: Payer: Self-pay | Admitting: Obstetrics & Gynecology

## 2020-09-17 DIAGNOSIS — Z20822 Contact with and (suspected) exposure to covid-19: Secondary | ICD-10-CM | POA: Diagnosis not present

## 2020-09-18 ENCOUNTER — Other Ambulatory Visit (HOSPITAL_COMMUNITY): Payer: Self-pay

## 2020-10-11 ENCOUNTER — Encounter (HOSPITAL_BASED_OUTPATIENT_CLINIC_OR_DEPARTMENT_OTHER): Payer: Self-pay

## 2020-10-11 ENCOUNTER — Other Ambulatory Visit (HOSPITAL_BASED_OUTPATIENT_CLINIC_OR_DEPARTMENT_OTHER): Payer: Self-pay | Admitting: Obstetrics & Gynecology

## 2020-10-12 ENCOUNTER — Telehealth (HOSPITAL_BASED_OUTPATIENT_CLINIC_OR_DEPARTMENT_OTHER): Payer: Self-pay | Admitting: Obstetrics & Gynecology

## 2020-10-12 ENCOUNTER — Other Ambulatory Visit (HOSPITAL_BASED_OUTPATIENT_CLINIC_OR_DEPARTMENT_OTHER): Payer: Self-pay | Admitting: Obstetrics & Gynecology

## 2020-10-12 ENCOUNTER — Other Ambulatory Visit (HOSPITAL_COMMUNITY): Payer: Self-pay

## 2020-10-12 ENCOUNTER — Encounter (HOSPITAL_BASED_OUTPATIENT_CLINIC_OR_DEPARTMENT_OTHER): Payer: Self-pay | Admitting: Obstetrics & Gynecology

## 2020-10-12 DIAGNOSIS — Z79890 Hormone replacement therapy (postmenopausal): Secondary | ICD-10-CM

## 2020-10-12 MED ORDER — PROGESTERONE MICRONIZED 100 MG PO CAPS
100.0000 mg | ORAL_CAPSULE | Freq: Two times a day (BID) | ORAL | 2 refills | Status: DC
  Filled 2020-10-12 – 2020-10-13 (×2): qty 60, 30d supply, fill #0
  Filled 2020-12-27: qty 60, 30d supply, fill #1
  Filled 2021-02-22: qty 60, 30d supply, fill #2

## 2020-10-12 MED ORDER — ESTRADIOL 0.025 MG/24HR TD PTTW
1.0000 | MEDICATED_PATCH | TRANSDERMAL | 2 refills | Status: DC
  Filled 2020-10-12: qty 8, 28d supply, fill #0
  Filled 2020-12-18: qty 8, 28d supply, fill #1
  Filled 2021-02-22: qty 8, 28d supply, fill #2

## 2020-10-12 NOTE — Telephone Encounter (Signed)
Called pt personally.  She has tried for about 1 year to be off HRT.  Still symptomatic.  Has used gabapentin, sleep meds, vaginal estradiol and progesterone only methods.  Wants to go back on low dosed estrogen.  Aware of risks which we discussed again today of DVT, PE, stroke, MI, breast cancer.  Will restart estradiol patch 0.025mg , 1/2 patch twice weekly.  She can increase to the full patch dosage if needed.  On progesterone 200mg  so needs refill.  Can decrease to 100mg  if stays on patch with symptom relief.  Wrote rx so she can come down in dosage if needed.  Voiced clear understanding.  3 months given.  She will call when needs refill and to give update.

## 2020-10-13 ENCOUNTER — Other Ambulatory Visit (HOSPITAL_COMMUNITY): Payer: Self-pay

## 2020-10-18 DIAGNOSIS — Z20822 Contact with and (suspected) exposure to covid-19: Secondary | ICD-10-CM | POA: Diagnosis not present

## 2020-10-19 ENCOUNTER — Ambulatory Visit (HOSPITAL_BASED_OUTPATIENT_CLINIC_OR_DEPARTMENT_OTHER): Payer: 59

## 2020-11-28 ENCOUNTER — Ambulatory Visit: Payer: 59

## 2020-12-18 ENCOUNTER — Other Ambulatory Visit (HOSPITAL_COMMUNITY): Payer: Self-pay

## 2020-12-19 ENCOUNTER — Other Ambulatory Visit: Payer: Self-pay

## 2020-12-19 ENCOUNTER — Ambulatory Visit (INDEPENDENT_AMBULATORY_CARE_PROVIDER_SITE_OTHER): Payer: 59 | Admitting: Sports Medicine

## 2020-12-19 DIAGNOSIS — M7521 Bicipital tendinitis, right shoulder: Secondary | ICD-10-CM

## 2020-12-19 DIAGNOSIS — M25511 Pain in right shoulder: Secondary | ICD-10-CM

## 2020-12-19 DIAGNOSIS — H5203 Hypermetropia, bilateral: Secondary | ICD-10-CM | POA: Diagnosis not present

## 2020-12-19 DIAGNOSIS — H524 Presbyopia: Secondary | ICD-10-CM | POA: Diagnosis not present

## 2020-12-19 NOTE — Patient Instructions (Signed)
It was great to meet you today, thank you for letting me participate in your care!  Today, we discussed: inflammation within the biceps tendon and possibly in the labrum (deeper structure of your shoulder. - Do the shoulder/bicep exercises we will show you. - No overhead activity, tennis serving, weights above the head, or push-ups for the next 3 weeks. Essentially, avoid activities that aggravate that area.  - Naproxen 500mg  twice daily for the next 7 days scheduled with food. You may also ice/heat as needed.  You will follow-up in 3 weeks with [place].  If you have any further questions, please give the clinic a call [phone].  [place],  Elba Barman, DO PGY-4, Sports Medicine Fellow [place]

## 2020-12-19 NOTE — Progress Notes (Signed)
PCP: Leeroy Cha, MD  Subjective:   HPI: Patient is a 65 y.o. female here for evaluation of right shoulder pain x 3 weeks.  Patient states her right shoulder started bothering her about 3 weeks ago.  She denies any known injury or inciting event.  She has noticed some pain and aggravation with overhead activities such as serving in tennis and with certain physical activity at her boot camp with the arm overhead and extended laterally.  She reports tenderness over the anterior part of her shoulder, near the bicipital groove. Denies any swelling, or skin changes. She denies any clicking/catching/popping. She has been taking anti-inflammatories and icing occasionally, which does provide some relief.  She has rested for the last week, but is still having some pain lingering.  Her pain is minimal to none at rest, but can get up to a 4-0/98 with certain overhead activities.  Past Medical History:  Diagnosis Date   Dyspareunia    Endometriosis     BP (!) 99/49   Ht 5\' 4"  (1.626 m)   Wt 115 lb (52.2 kg)   LMP 08/12/2011 (Exact Date)   BMI 19.74 kg/m   [place] Adult Exercise 03/06/2020  Frequency of aerobic exercise (# of days/week) 5  Average time in minutes 90  Frequency of strengthening activities (# of days/week) 3   No flowsheet data found.  Review of Systems: See HPI above.     Objective:  Physical Exam:  Gen: Well-appearing, in no acute distress; non-toxic CV: Regular Rate. Well-perfused. Warm.  Resp: Breathing unlabored on room air; no wheezing. Psych: Fluid speech in conversation; appropriate affect; normal thought process Neuro: Sensation intact throughout. No gross coordination deficits.  MSK:  - Shoulder, Right: TTP noted at the Bicipital groove in the anterior shoulder. No skin changes, erythema, or ecchymosis noted. No evidence of bony deformity, asymmetry, or muscle atrophy. No TTP at [facility] joint. Full active and passive range of motion both  actively and passively, although with pain with abduction in ext rotation. Strength 5/5 throughout. No abnormal scapular function observed. Sensation to light touch intact. Peripheral pulses intact.   Special Tests:   - Painful Arc: absent    - Empty can: NEG   - Int/Ext Rotation test: NEG   - Gerber Lift-Off Test: NEG   - Scarf's test: NEG   - Crossarm Adduction test: NEG   - Neer test: NEG   - O'brien's test: NEG   - Yergason's: equivocal   - Speeds test: Positive   MSK Complete US of Shoulder, right  Patient was seated in examination room and shoulder US examination was performed using high frequency linear probe. The biceps tendon was visualized within the bicipital groove in both longitudinal and transverse axis with notable hypoechoic changes surrounding biceps tendon, indicative of fluid/edema. The tendon fibers were intact without signs of irregularity. The subscapularis tendon was visualized in both longitudinal and transverse axis with intact tendon inserting at the inferior lesser tubercle of the humerus. No signs of tear, no hypoechoic changes or tissue irregularity were seen. The supraspinatus tendon was visualized in longitudinal, transverse, and dynamic views. No signs of tear with the tendon inserting at the superior facet of the greater tubercle of the humerus, no hypoechoic changes or tissue irregularity seen. The infraspinatus tendon were visualized in both longitudinal and transverse axis with tendon insertion at the middle facet of the great tubercle of the humerus with no signs of tearing, hypoechoic changes or tissue irregularity seen. The  posterior glenohumeral joint was visualized with proper alignment. The [facility] Joint was visualized with noted bursal distension; + Geyser sign. There was no evidence of significant bony spurs/arthritic changes.   IMPRESSION: Edema/fluid encompassing the distal bicep tendon with intact tendon pathology; there was also fluid and bursal  distention within the [facility] joint, representative of a Geyser sign. Otherwise, the remainder of the U/S examination of the shoulder was within normal limits.    Assessment & Plan:  1. Right Shoulder pain - subacute. Physical exam and U/S most indicative of biceps tendonitis with surrounding fluid/edema, although given the bursal distension noted within the [facility] joint there is the possibility, although less likely, of labral pathology. We discussed modifying activity, shutting down overhead activity and aggravating movements, and taking Naproxen BID x 7 days to reduce the inflammation. We also provided the patient with home PT (Jobe's, scapular retraction, and Spokes of the Wheel exercises) that she is to begin at home at least once daily. She will f/u in 3 weeks to re-evaluate unless issues arise sooner.   Elba Barman, DO PGY-4, Sports Medicine Fellow [place]

## 2020-12-27 ENCOUNTER — Other Ambulatory Visit (HOSPITAL_COMMUNITY): Payer: Self-pay

## 2021-01-09 ENCOUNTER — Other Ambulatory Visit: Payer: Self-pay

## 2021-01-09 ENCOUNTER — Ambulatory Visit (INDEPENDENT_AMBULATORY_CARE_PROVIDER_SITE_OTHER): Payer: 59 | Admitting: Sports Medicine

## 2021-01-09 VITALS — BP 108/62 | Ht 64.0 in | Wt 115.0 lb

## 2021-01-09 DIAGNOSIS — M25511 Pain in right shoulder: Secondary | ICD-10-CM | POA: Diagnosis not present

## 2021-01-09 NOTE — Progress Notes (Signed)
Subjective:    Patient ID: Rachel Beasley, female    DOB: 27-Jul-1955, 65 y.o.   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  She was last seen in clinic on 12/19/2020. MSK ultrasound of the right shoulder was performed, with fluid noted around the proximal biceps tendon, as well as bursal distention within the [facility] joint, otherwise normal. She was instructed to discontinue overhead activities, take naproxen for 7 days and given home exercises (Jobe's, scapular retraction, and Spokes of the Wheel exercises).  Today she states that her pain is improving, however not completely better.  She was compliant with the home exercises and naproxen which helped.  She states that she still has some pain with reaching forward and is fearful of resuming tennis. She denies any neck pain, numbness, weakness, tingling in her arm.  She states that the pain is localized at her anterior shoulder, does not radiate.  Review of Systems As above    Objective:   Physical Exam  Well-developed, well-nourished.  No acute distress. Gen: Well-appearing, in no acute distress; non-toxic Neuro: Sensation intact to light touch in all dermatomes of bilateral upper extremities MSK:  - Shoulder, Right: TTP noted at the Bicipital groove in the anterior shoulder. No skin changes, erythema, or ecchymosis noted. No evidence of bony deformity, asymmetry, or muscle atrophy. No TTP at [facility] joint. Full active and passive range of motion both actively and passively. Strength 5/5 throughout.              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 Believe that her symptoms may be secondary to labral pathology, given fluid noted around proximal biceps tendon on MSK ultrasound at last visit. Today we discussed treatment options which include continuing home exercises, initiating formal physical therapy, or proceeding with MRI of the right shoulder to evaluate for labral pathology.  -Continue conservative management. Per patient preference, will trial formal physical therapy prior to restarting tennis  and re-evaluate symptoms.  We will focus on scapular stabilization exercises, rotator cuff strengthening, as well as modalities. If her symptoms fail to improve, we will discuss the need for further imaging to evaluate for labral pathology.  Electronically signed by: Sherrell Puller MD PGY-4 PM&R  Patient seen and evaluated with the resident.  I agree with the above plan of care.  Patient symptoms are improving but I think she would benefit from formal physical therapy.  Once she is discharged from PT she may resume tennis.  If she is unable to return to her previous level of activity she will contact me and we will evaluate further with an MRI.  She will follow-up for ongoing or recalcitrant issues.

## 2021-01-18 ENCOUNTER — Other Ambulatory Visit: Payer: Self-pay

## 2021-01-18 ENCOUNTER — Ambulatory Visit: Payer: 59 | Attending: Sports Medicine | Admitting: Physical Therapy

## 2021-01-18 DIAGNOSIS — M6281 Muscle weakness (generalized): Secondary | ICD-10-CM | POA: Insufficient documentation

## 2021-01-18 DIAGNOSIS — R293 Abnormal posture: Secondary | ICD-10-CM | POA: Diagnosis not present

## 2021-01-18 DIAGNOSIS — M25511 Pain in right shoulder: Secondary | ICD-10-CM | POA: Diagnosis not present

## 2021-01-18 NOTE — Therapy (Signed)
[place], [place], [address] Phone: [phone]   Fax:  [phone]  Physical Therapy Evaluation  Patient Details  Name: LILANDRA PALOMEQUE MRN: [id number] Date of Birth: 1956-01-16 Referring Provider (PT): DrTtim Micheline Chapman DO   Encounter Date: 01/18/2021   PT End of Session - 01/18/21 0833     Visit Number 1    Number of Visits 16    Date for PT Re-Evaluation 03/15/21    Authorization Type UMR    PT Start Time 0802    PT Stop Time 0845    PT Time Calculation (min) 43 min    Activity Tolerance Patient tolerated treatment well    Behavior During Therapy [facility] for tasks assessed/performed             Past Medical History:  Diagnosis Date   Dyspareunia    Endometriosis     Past Surgical History:  Procedure Laterality Date   [place]   benign- endometrioma- laparatomy then danazol 6 months   SPINAL FUSION  2006   C5-6 rupture    There were no vitals filed for this visit.    Subjective Assessment - 01/18/21 0823     Subjective I have been having pain since June I started noticing that I was having pain at boot  camp [employer] at [facility]) and I do tennis.  and when I was doing overheads in tennis.  I doint remember an incident but I definitely notice it with overheads out to the side ( abducted).  so I saw DR Micheline Chapman and he suspects a labral tear.  So I am doing PT conservatively. He gave me exercise UE exercises    Limitations Lifting   Playing tennis and boot camp   How long can you sit comfortably? unlimited    How long can you stand comfortably? unlimited    How long can you walk comfortably? unlimited    Diagnostic tests Ultrasound with fluid on biceps tendon    Patient Stated Goals Be able to get back to tennis.  heal shoulder and get stronger    Currently in Pain? Yes    Pain Score 5     Pain Location Shoulder     Pain Orientation Right    Pain Descriptors / Indicators Sore;Stabbing    Pain Type Acute pain    Pain Onset More than a month ago    Pain Frequency Intermittent    Aggravating Factors  hitting overheads in tennis , reaching at 90 degrees and lowering biceps    Pain Relieving Factors resting.  using ice                [facility] PT Assessment - 01/18/21 0001       Assessment   Medical Diagnosis R shoulder pain (labral tear?)    Referring Provider (PT) DrTtim Micheline Chapman DO      Balance Screen   Has the patient fallen in the past 6 months No    Has the patient had a decrease in activity level because of a fear of falling?  No    Is the patient reluctant to leave their home because of a fear of falling?  No      Observation/Other Assessments   Focus on Therapeutic Outcomes (FOTO)  Intake 53%, predicted 72%      Posture/Postural Control   Posture/Postural Control Postural limitations  Postural Limitations Rounded Shoulders;Forward head      ROM / Strength   AROM / PROM / Strength AROM;Strength      AROM   Right Shoulder Flexion 140 Degrees   PROM 146   Right Shoulder ABduction 133 Degrees   140 prom   Right Shoulder Internal Rotation 31 Degrees    Right Shoulder External Rotation 95 Degrees    Right Shoulder Horizontal  ADduction 100 Degrees    Left Shoulder Flexion 168 Degrees    Left Shoulder ABduction 160 Degrees    Left Shoulder Internal Rotation 60 Degrees    Left Shoulder External Rotation 96 Degrees    Left Shoulder Horizontal ADduction 113 Degrees      Strength   Overall Strength Deficits    Right Shoulder Flexion 4+/5    Right Shoulder ABduction 4/5    Right Shoulder Internal Rotation 4-/5    Right Shoulder External Rotation 4+/5    Left Shoulder Flexion 5/5    Left Shoulder ABduction 5/5    Left Shoulder Internal Rotation 5/5    Left Shoulder External Rotation 5/5      Palpation   Palpation comment TTP over prox biceps long head insertion,      Special  Tests    Special Tests Biceps/Labral Tests      Clunk Test   Findings Negative    Side Right      Speeds test   findings Positive    Side Right    Comment pt positione with palms up,      other   Comment Adsons test positive      other   Comment Pt with Rhand ER/Lhand 1 inch  Left handER/R hand IR 18 inches                        Objective measurements completed on examination: See above findings.                 PT Short Term Goals - 01/18/21 1041       PT SHORT TERM GOAL #1   Title Pt will be independent with intial HEP    Baseline no knowledge, given exercises from MD office    Time 3    Period Weeks    Status New    Target Date 02/08/21      PT SHORT TERM GOAL #2   Title  Report pain decrease from   5 /10 to   2 /10.    Baseline 5/10 sharp and stabbing in scaption over 90 degrees and reaching overhead    Time 3    Period Weeks    Status New    Target Date 02/08/21      PT SHORT TERM GOAL #3   Title Pt will be able to increase horizontal adduction to be symmetrical    Baseline horizontal add L 115, R 100    Time 3    Period Weeks    Status New    Target Date 02/08/21               PT Long Term Goals - 01/18/21 1043       PT LONG TERM GOAL #1   Title Pt will be independent with advanced HEP.    Time 8    Period Weeks    Status New    Target Date 02/01/21      PT LONG TERM GOAL #2   Title Pain  will decrease to 1/10  or less with all functional activities    Baseline 5/10 and unable to lift above 90 degrees    Time 8    Period Weeks    Status New    Target Date 03/15/21      PT LONG TERM GOAL #3   Title R shoulder AROM flexion will improve to 0-160 degrees for improved overhead reaching    Baseline 133 R AROM with pain    Time 8    Period Weeks    Status New    Target Date 03/15/21      PT LONG TERM GOAL #4   Title FOTO will improve from 53%    to  72%   indicating improved functional mobility .     Baseline eval intake 53%    Time 8    Period Weeks    Status New    Target Date 03/15/21      PT LONG TERM GOAL #5   Title Reach behind head for ADL's without difficulty    Baseline Pt with back scratch test Left handIR/R hand ER 1 inch.  R hand IR/L hand ER 18 inch    Time 8    Period Weeks    Status New    Target Date 03/15/21                    Plan - 01/18/21 0847     Clinical Impression Statement Ms Discher presents with signs and symptoms compatible with RTC/labral tear with tenderness over prox biceps tendon  5/10 pain. Marland Kitchen Pt presents with impairments including pain, limited ROM, posterior capsule tightness,UE weakness, and as a result, pt is limited with functional activities and also unable to play recreational tennis. 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 Pt would benefit from skilled PT services for 2 times a week for 8 weeks to progress toward pain-free PLOF and return to sport/exercise.    Examination-Activity Limitations Other;Lift;Reach Overhead    Examination-Participation Restrictions Other   recreation tennis   Stability/Clinical Decision Making Stable/Uncomplicated    Clinical Decision Making Low    Rehab Potential Excellent    PT Frequency 2x / week    PT Duration 8 weeks    PT Treatment/Interventions ADLs/Self Care Home Management;Cryotherapy;Electrical Stimulation;Iontophoresis '4mg'$ /ml Dexamethasone;Moist Heat;Therapeutic exercise;Neuromuscular re-education;Patient/family education;Passive range of motion;Manual techniques;Dry needling;Taping;Joint Manipulations    PT Next Visit Plan try TPDN for post capsule tightness.  review exercise.  post capsule stretch.  and sleeper stretch    PT Home Exercise Plan [place] and Agree with Plan of Care Patient             Patient will benefit from skilled therapeutic intervention in order to improve the following deficits and  impairments:  Pain, Improper body mechanics, Postural dysfunction, Impaired UE functional use, Impaired flexibility, Decreased strength, Decreased range of motion  Visit Diagnosis: Acute pain of right shoulder  Muscle weakness (generalized)  Abnormal posture  Access Code: [email or web address]: 08/18/2022Prepared by: Donnetta Simpers BeardsleyExercises  Standing Isometric Shoulder Extension at Table - 1 x daily - 7 x weekly - 1 sets - 5 reps - 45 hold  Doorway Pec Stretch at 90 Degrees Abduction - 1 x daily - 7 x weekly - 1 sets - 3 reps - 30 sec hold  Sidelying Shoulder ER with Towel and Dumbbell - 1 x daily -  7 x weekly - 3 sets - 10 reps - 3 lb hold  Sidelying Shoulder Flexion 15 Degrees - 1 x daily - 7 x weekly - 3 sets - 10 reps - 3lb hold  Sidelying Shoulder Abduction - 1 x daily - 7 x weekly - 3 sets - 10 reps - 3lb hold   Problem List Patient Active Problem List   Diagnosis Date Noted   Osteopenia 08/29/2020   Primary insomnia 08/29/2020   Personal history of colonic polyps 08/28/2020   Vitamin D deficiency 08/28/2020   Hormone replacement therapy (HRT) 02/13/2017   Right foot pain 02/22/2013   LATERAL EPICONDYLITIS, BILATERAL 12/26/2009   Voncille Lo, PT, [place] Certified Exercise Expert for the Aging Adult  01/18/21 11:01 AM Phone: [phone] Fax: [place] [facility] [address] [place], [place], [address] Phone: [phone]   Fax:  [phone]  Name: KATHIA RYBURN MRN: [id number] Date of Birth: 14-Oct-1955

## 2021-01-18 NOTE — Patient Instructions (Signed)
   Voncille Lo, PT, [place] Certified Exercise Expert for the Aging Adult  01/18/21 8:49 AM Phone: [phone] Fax: [phone]

## 2021-01-19 ENCOUNTER — Other Ambulatory Visit (HOSPITAL_COMMUNITY): Payer: Self-pay

## 2021-01-22 ENCOUNTER — Other Ambulatory Visit (HOSPITAL_COMMUNITY): Payer: Self-pay

## 2021-01-22 MED ORDER — PEG 3350-KCL-NA BICARB-NACL 420 G PO SOLR
ORAL | 0 refills | Status: DC
  Filled 2021-01-22: qty 4000, 1d supply, fill #0

## 2021-01-23 DIAGNOSIS — D122 Benign neoplasm of ascending colon: Secondary | ICD-10-CM | POA: Diagnosis not present

## 2021-01-23 DIAGNOSIS — D120 Benign neoplasm of cecum: Secondary | ICD-10-CM | POA: Diagnosis not present

## 2021-01-23 DIAGNOSIS — Z86010 Personal history of colonic polyps: Secondary | ICD-10-CM | POA: Diagnosis not present

## 2021-01-25 ENCOUNTER — Ambulatory Visit: Payer: 59

## 2021-01-25 ENCOUNTER — Other Ambulatory Visit: Payer: Self-pay

## 2021-01-25 ENCOUNTER — Encounter: Payer: Self-pay | Admitting: Physical Therapy

## 2021-01-25 ENCOUNTER — Ambulatory Visit: Payer: 59 | Admitting: Physical Therapy

## 2021-01-25 DIAGNOSIS — R293 Abnormal posture: Secondary | ICD-10-CM

## 2021-01-25 DIAGNOSIS — M6281 Muscle weakness (generalized): Secondary | ICD-10-CM | POA: Diagnosis not present

## 2021-01-25 DIAGNOSIS — M25511 Pain in right shoulder: Secondary | ICD-10-CM

## 2021-01-25 NOTE — Therapy (Signed)
[place], [place], [address] Phone: [phone]   Fax:  [phone]  Physical Therapy Treatment  Patient Details  Name: Rachel Beasley MRN: [id number] Date of Birth: [birth year] Referring Provider (PT): DrTtim Micheline Chapman DO   Encounter Date: 01/25/2021   PT End of Session - 01/25/21 1206     Visit Number 2    Number of Visits 16    Date for PT Re-Evaluation 03/15/21    Authorization Type UMR    PT Start Time 1102    PT Stop Time 1200    PT Time Calculation (min) 58 min    Activity Tolerance Patient tolerated treatment well    Behavior During Therapy [facility] for tasks assessed/performed             Past Medical History:  Diagnosis Date   Dyspareunia    Endometriosis     Past Surgical History:  Procedure Laterality Date   [place]   benign- endometrioma- laparatomy then danazol 6 months   SPINAL FUSION  2006   C5-6 rupture    There were no vitals filed for this visit.   Subjective Assessment - 01/25/21 1107     Limitations Lifting    Diagnostic tests Ultrasound with fluid on biceps tendon    Patient Stated Goals Be able to get back to tennis.  heal shoulder and get stronger    Currently in Pain? Yes    Pain Score 0-No pain   when lifting 3/10   Pain Location Shoulder    Pain Orientation Right    Pain Descriptors / Indicators Sore;Stabbing    Pain Type Acute pain    Pain Onset More than a month ago    Pain Frequency Intermittent              ther ex Sidelyiing post capsule on R with horizontal abduction.  3 x 30 sec stretch. Initially,  pt unable to pull elbow up 2 inches from mat without pain  Sidelying Eccentric [place] Internal Rotation with red t band.   Loop red t band around left knee in 90 flexion./ 90 knee flexion to  stabilize red t band.  Pt with R shoulder 90 degree shld flexion. 90 degree elbow flex and slowly IR  eccentrically 3 x 10   Reviewed HEP with  Sidelying R shld flexion with 3 lb 2 x 10 Sidelying R shld abduction with 3lb 2 x 10 Sidelying R shld ER with 3 lb wt  Doorwary pectoral stretch                   [facility] Adult PT Treatment/Exercise - 01/25/21 0001       Self-Care   Self-Care Other Self-Care Comments    Other Self-Care Comments  prcautioans for TPDN and aftercare, precautioans for local community fitness class ( " burn boot camp style"      Modalities   Modalities Moist Heat      Moist Heat Therapy   Number Minutes Moist Heat 15 Minutes    Moist Heat Location Shoulder      Manual Therapy   Manual Therapy Soft tissue mobilization;Joint mobilization    Manual therapy comments skilled palpation for TPDN    Joint Mobilization posterior capsule stretching    Soft tissue mobilization periscapulare soft tissue work , subscapularis  Trigger Point Dry Needling - 01/25/21 0001     Consent Given? Yes    Education Handout Provided Yes    Muscles Treated Upper Quadrant Subscapularis    Dry Needling Comments 50 mm 30 gage    Subscapularis Response Twitch response elicited;Palpable increased muscle length                  PT Education - 01/25/21 1213     Education Details POC explanation of findings,  HEP, TPDN education on precautions for exercise at local community fitness/ went over [place] report    Person(s) Educated Patient    Methods Explanation;Demonstration;Tactile cues;Verbal cues;Handout    Comprehension Verbalized understanding;Returned demonstration              PT Short Term Goals - 01/18/21 1041       PT SHORT TERM GOAL #1   Title Pt will be independent with intial HEP    Baseline no knowledge, given exercises from MD office    Time 3    Period Weeks    Status New    Target Date 02/08/21      PT SHORT TERM GOAL #2   Title  Report pain decrease from   5 /10 to   2 /10.    Baseline 5/10 sharp and stabbing in  scaption over 90 degrees and reaching overhead    Time 3    Period Weeks    Status New    Target Date 02/08/21      PT SHORT TERM GOAL #3   Title Pt will be able to increase horizontal adduction to be symmetrical    Baseline horizontal add L 115, R 100    Time 3    Period Weeks    Status New    Target Date 02/08/21               PT Long Term Goals - 01/18/21 1043       PT LONG TERM GOAL #1   Title Pt will be independent with advanced HEP.    Time 8    Period Weeks    Status New    Target Date 02/01/21      PT LONG TERM GOAL #2   Title Pain will decrease to 1/10  or less with all functional activities    Baseline 5/10 and unable to lift above 90 degrees    Time 8    Period Weeks    Status New    Target Date 03/15/21      PT LONG TERM GOAL #3   Title R shoulder AROM flexion will improve to 0-160 degrees for improved overhead reaching    Baseline 133 R AROM with pain    Time 8    Period Weeks    Status New    Target Date 03/15/21      PT LONG TERM GOAL #4   Title FOTO will improve from 53%    to  72%   indicating improved functional mobility .    Baseline eval intake 53%    Time 8    Period Weeks    Status New    Target Date 03/15/21      PT LONG TERM GOAL #5   Title Reach behind head for ADL's without difficulty    Baseline Pt with back scratch test Left handIR/R hand ER 1 inch.  R hand IR/L hand ER 18 inch    Time 8    Period Weeks  Status New    Target Date 03/15/21                   Plan - 01/25/21 1101     Clinical Impression Statement Pt able to participate in exericses given and reports no pain today but 3/10 when raising R shld overhead.  Pt consented to TPDN to R shld and was educated on TPDN precautians and aftercare.  Pt responded well with increased AROM post TPDN.  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   planks on elbows approved for loading at  camp but avoid ballisic and added weights for biceps.  Only do exercises given on HEP. May do LE work out. Will continue POC    Examination-Activity Limitations Other;Lift;Reach Overhead    Examination-Participation Restrictions Other    PT Frequency 2x / week    PT Duration 8 weeks    PT Treatment/Interventions ADLs/Self Care Home Management;Cryotherapy;Electrical Stimulation;Iontophoresis '4mg'$ /ml Dexamethasone;Moist Heat;Therapeutic exercise;Neuromuscular re-education;Patient/family education;Passive range of motion;Manual techniques;Dry needling;Taping;Joint Manipulations    PT Next Visit Plan try TPDN for post capsule tightness.  review exercise.  post capsule stretch.  and sleeper stretch    PT Home Exercise Plan [place] and Agree with Plan of Care Patient             Patient will benefit from skilled therapeutic intervention in order to improve the following deficits and impairments:  Pain, Improper body mechanics, Postural dysfunction, Impaired UE functional use, Impaired flexibility, Decreased strength, Decreased range of motion  Visit Diagnosis: Acute pain of right shoulder  Abnormal posture  Muscle weakness (generalized)     Problem List Patient Active Problem List   Diagnosis Date Noted   Osteopenia 08/29/2020   Primary insomnia 08/29/2020   Personal history of colonic polyps 08/28/2020   Vitamin D deficiency 08/28/2020   Hormone replacement therapy (HRT) 02/13/2017   Right foot pain 02/22/2013   LATERAL EPICONDYLITIS, BILATERAL 12/26/2009    Voncille Lo, PT, [place] Certified Exercise Expert for the Aging Adult  01/25/21 12:16 PM Phone: [phone] Fax: [facility] [address] [place], [place], [address] Phone: [phone]   Fax:  [phone]  Name: Rachel Beasley MRN: [id number] Date of Birth: [birth year]

## 2021-01-25 NOTE — Patient Instructions (Addendum)
Eccentric Glenohumeral Internal Rotation with t band. as shown in clinic in right sidelying. in clinic   Trigger Point Dry Needling  What is Trigger Point Dry Needling (DN)? DN is a physical therapy technique used to treat muscle pain and dysfunction. Specifically, DN helps deactivate muscle trigger points (muscle knots).  A thin filiform needle is used to penetrate the skin and stimulate the underlying trigger point. The goal is for a local twitch response (LTR) to occur and for the trigger point to relax. No medication of any kind is injected during the procedure.   What Does Trigger Point Dry Needling Feel Like?  The procedure feels different for each individual patient. Some patients report that they do not actually feel the needle enter the skin and overall the process is not painful. Very mild bleeding may occur. However, many patients feel a deep cramping in the muscle in which the needle was inserted. This is the local twitch response.   How Will I feel after the treatment? Soreness is normal, and the onset of soreness may not occur for a few hours. Typically this soreness does not last longer than two days.  Bruising is uncommon, however; ice can be used to decrease any possible bruising.  In rare cases feeling tired or nauseous after the treatment is normal. In addition, your symptoms may get worse before they get better, this period will typically not last longer than 24 hours.   What Can I do After My Treatment? Increase your hydration by drinking more water for the next 24 hours. You may place ice or heat on the areas treated that have become sore, however, do not use heat on inflamed or bruised areas. Heat often brings more relief post needling. You can continue your regular activities, but vigorous activity is not recommended initially after the treatment for 24 hours. DN is best combined with other physical therapy such as strengthening, stretching, and other therapies.     Voncille Lo, PT, [place] Certified Exercise Expert for the Aging Adult  01/25/21 11:27 AM Phone: [phone] Fax: [phone]

## 2021-01-30 ENCOUNTER — Ambulatory Visit: Payer: 59 | Admitting: Physical Therapy

## 2021-02-01 ENCOUNTER — Ambulatory Visit: Payer: 59 | Admitting: Physical Therapy

## 2021-02-06 ENCOUNTER — Other Ambulatory Visit: Payer: Self-pay

## 2021-02-06 ENCOUNTER — Ambulatory Visit: Payer: 59 | Attending: Sports Medicine | Admitting: Physical Therapy

## 2021-02-06 ENCOUNTER — Encounter: Payer: Self-pay | Admitting: Physical Therapy

## 2021-02-06 DIAGNOSIS — M6281 Muscle weakness (generalized): Secondary | ICD-10-CM | POA: Diagnosis not present

## 2021-02-06 DIAGNOSIS — M25511 Pain in right shoulder: Secondary | ICD-10-CM | POA: Diagnosis not present

## 2021-02-06 DIAGNOSIS — R293 Abnormal posture: Secondary | ICD-10-CM | POA: Diagnosis not present

## 2021-02-06 NOTE — Therapy (Signed)
[place], [place], [address] Phone: [phone]   Fax:  [phone]  Physical Therapy Treatment  Patient Details  Name: Rachel Beasley MRN: [id number] Date of Birth: Apr 29, 1956 Referring Provider (PT): DrTtim Micheline Chapman DO   Encounter Date: 02/06/2021   PT End of Session - 02/06/21 0946     Visit Number 3    Number of Visits 16    Date for PT Re-Evaluation 03/15/21    Authorization Type UMR    PT Start Time 0801    PT Stop Time 0900    PT Time Calculation (min) 59 min    Activity Tolerance Patient tolerated treatment well    Behavior During Therapy [facility] for tasks assessed/performed             Past Medical History:  Diagnosis Date   Dyspareunia    Endometriosis     Past Surgical History:  Procedure Laterality Date   [place]   benign- endometrioma- laparatomy then danazol 6 months   SPINAL FUSION  2006   C5-6 rupture    There were no vitals filed for this visit.   Subjective Assessment - 02/06/21 0806     Subjective I tried to do the eccentric ER with band exercise and it was irritating so I stopped.    Limitations Lifting    Diagnostic tests Ultrasound with fluid on biceps tendon    Patient Stated Goals Be able to get back to tennis.  heal shoulder and get stronger    Currently in Pain? Yes    Pain Score 2     Pain Location Shoulder    Pain Orientation Right    Pain Descriptors / Indicators Sore    Pain Type Acute pain    Pain Onset More than a month ago                [facility] PT Assessment - 02/06/21 0001       Assessment   Medical Diagnosis R shoulder pain (labral tear?)    Referring Provider (PT) DrTtim Draper DO      AROM   Right Shoulder Internal Rotation 46 Degrees    Right Shoulder Horizontal  ADduction 111 Degrees              ther ex   Sidelyiing post capsule on R with horizontal abduction.   3 x 30 sec stretch.  Pt reports irritation of anterior shld with exercise Sidelying Eccentric [place] Internal Rotation with red t band.   Loop red t band around left knee in 90 flexion./ 90 knee flexion to  stabilize red t band.  Pt with R shoulder 90 degree shld flexion. 90 degree elbow flex and slowly IR eccentrically 3 x 10 MOdified by holding R shoulder with Left hand and helping wrist up with Left hand and ONLY eccentrically acitivating  Green tband star pattern ( t band green) resisted star t band (diag/horiz abd)  lat stretch with elbows on table and thoracic extension   supine  bil flexion/dowel with 5 # weight /breathing VC,  ribs tend to flare with TC and VC 3x 10  Pt may dC sidelying exercises and progress to upright for progressing to serving and overhead strengtheing/ posterior/ sub scapular stabilizers  Pec stretch door way  Followed by TPDN and iontophoresis  [facility] Adult PT Treatment/Exercise - 02/06/21 0001       Modalities   Modalities Moist Heat      Moist Heat Therapy   Number Minutes Moist Heat 15 Minutes    Moist Heat Location Shoulder      Iontophoresis   Type of Iontophoresis Dexamethasone    Location anterior shld over long head of biceps    Dose 1 cc    Time 4-6 hours for patch      Manual Therapy   Manual Therapy Soft tissue mobilization;Joint mobilization    Manual therapy comments skilled palpation for TPDN    Joint Mobilization posterior capsule stretching    Soft tissue mobilization periscapular soft tissue work , subscapularis, teres major, minor and pecs              Trigger Point Dry Needling - 02/06/21 0001     Consent Given? Yes    Education Handout Provided Previously provided    Muscles Treated Upper Quadrant Subscapularis;Pectoralis major;Pectoralis minor;Deltoid    Dry Needling Comments 50 mm 30 gage    Pectoralis Major Response Twitch response elicited;Palpable increased muscle length    Pectoralis  Minor Response Twitch response elicited;Palpable increased muscle length    Subscapularis Response Twitch response elicited;Palpable increased muscle length    Deltoid Response Twitch response elicited;Palpable increased muscle length                  PT Education - 02/06/21 0822     Education Details Added to HEP today resisted star t band (diag/horiz abd) and lat stretch, and supine flexion/dowel with 5 # weight /breathing    Person(s) Educated Patient    Methods Explanation;Demonstration;Tactile cues;Verbal cues;Handout    Comprehension Verbalized understanding;Returned demonstration              PT Short Term Goals - 02/06/21 0948       PT SHORT TERM GOAL #1   Title Pt will be independent with intial HEP    Baseline compliant with initial HEP    Time 3    Period Weeks    Status Achieved    Target Date 02/08/21      PT SHORT TERM GOAL #2   Title  Report pain decrease from   5 /10 to   2 /10.    Baseline 2/10 today on anterior shld    Time 3    Period Weeks    Status On-going    Target Date 02/08/21      PT SHORT TERM GOAL #3   Title Pt will be able to increase horizontal adduction to be symmetrical    Baseline horizontal add L 115, R 111    Time 3    Period Weeks    Status On-going               PT Long Term Goals - 01/18/21 1043       PT LONG TERM GOAL #1   Title Pt will be independent with advanced HEP.    Time 8    Period Weeks    Status New    Target Date 02/01/21      PT LONG TERM GOAL #2   Title Pain will decrease to 1/10  or less with all functional activities    Baseline 5/10 and unable to lift above 90 degrees    Time 8    Period Weeks    Status New    Target Date 03/15/21  PT LONG TERM GOAL #3   Title R shoulder AROM flexion will improve to 0-160 degrees for improved overhead reaching    Baseline 133 R AROM with pain    Time 8    Period Weeks    Status New    Target Date 03/15/21      PT LONG TERM GOAL #4   Title  FOTO will improve from 53%    to  72%   indicating improved functional mobility .    Baseline eval intake 53%    Time 8    Period Weeks    Status New    Target Date 03/15/21      PT LONG TERM GOAL #5   Title Reach behind head for ADL's without difficulty    Baseline Pt with back scratch test Left handIR/R hand ER 1 inch.  R hand IR/L hand ER 18 inch    Time 8    Period Weeks    Status New    Target Date 03/15/21                   Plan - 02/06/21 [id number]     Clinical Impression Statement Pt enters clinic with 2/10 pain in anterior shld.  she did have complaint of pain with eccentric GHJ IR with red t band.  Pt needed correction for proper execution of exercise which eliminated irritation.   Pt increased AROM of horiz add to 111 and IR to 46 degrees.  Pt still complains of R anterior shld pain ( possible labral tear/irritation at biceps insertions.  Pt is compliant with current HEP.  STG # 1 and # 2 achieved.  Pt nearing goal for horizontal ADD.  Pt wants to return to tennis playing and wants to strengthen for overhead serves  Will cont POC    Examination-Activity Limitations Other;Lift;Reach Overhead    Examination-Participation Restrictions Other    PT Frequency 2x / week    PT Duration 8 weeks    PT Treatment/Interventions ADLs/Self Care Home Management;Cryotherapy;Electrical Stimulation;Iontophoresis '4mg'$ /ml Dexamethasone;Moist Heat;Therapeutic exercise;Neuromuscular re-education;Patient/family education;Passive range of motion;Manual techniques;Dry needling;Taping;Joint Manipulations    PT Next Visit Plan try TPDN for post capsule tightness.  review exercise.  post capsule stretch.  and sleeper stretch/ lat stretch.  Progress biceps strengh as able and not irritating shoulder  Progess sub scapular stabilizers.  Less pain when setting shoulder with exercises.    PT Home Exercise Plan [place] and Agree with Plan of Care Patient             Patient will benefit  from skilled therapeutic intervention in order to improve the following deficits and impairments:  Pain, Improper body mechanics, Postural dysfunction, Impaired UE functional use, Impaired flexibility, Decreased strength, Decreased range of motion  Visit Diagnosis: Acute pain of right shoulder  Muscle weakness (generalized)  Abnormal posture Add to HEP Access Code: [email or web address]: [email or web address]: 09/06/2022Prepared by: Lesly Rubenstein Notes   B) Using dowel rod , put 5 lb weight and flex shoulders overhead, hold for 5 as you exhale, ribs down, shoulders set  Exercises    Standing Shoulder Diagonal Horizontal Abduction 60/120 Degrees with Resistance - 1 x daily - 7 x weekly - 3 sets - 10 reps   Problem List Patient Active Problem List   Diagnosis Date Noted   Osteopenia 08/29/2020   Primary insomnia 08/29/2020   Personal history of colonic polyps 08/28/2020   Vitamin D deficiency 08/28/2020   Hormone replacement therapy (HRT)  02/13/2017   Right foot pain 02/22/2013   LATERAL EPICONDYLITIS, BILATERAL 12/26/2009   Voncille Lo, PT, [place] Certified Exercise Expert for the Aging Adult  02/06/21 9:57 AM Phone: [phone] Fax: [facility] [address] [place], [place], [address] Phone: [phone]   Fax:  [phone]  Name: REVA BOURGOYNE MRN: [id number] Date of Birth: 1955-08-01

## 2021-02-06 NOTE — Patient Instructions (Signed)
     IONTOPHORESIS PATIENT PRECAUTIONS & CONTRAINDICATIONS:  Redness under one or both electrodes can occur.  This characterized by a uniform redness that usually disappears within 12 hours of treatment. Small pinhead size blisters may result in response to the drug.  Contact your physician if the problem persists more than 24 hours. On rare occasions, iontophoresis therapy can result in temporary skin reactions such as rash, inflammation, irritation or burns.  The skin reactions may be the result of individual sensitivity to the ionic solution used, the condition of the skin at the start of treatment, reaction to the materials in the electrodes, allergies or sensitivity to dexamethasone, or a poor connection between the patch and your skin.  Discontinue using iontophoresis if you have any of these reactions and report to your therapist. Remove the Patch or electrodes if you have any undue sensation of pain or burning during the treatment and report discomfort to your therapist. Tell your Therapist if you have had known adverse reactions to the application of electrical current. If using the Patch, the LED light will turn off when treatment is complete and the patch can be removed.  Approximate treatment time is 1-3 hours.  Remove the patch when light goes off or after 6 hours. The Patch can be worn during normal activity, however excessive motion where the electrodes have been placed can cause poor contact between the skin and the electrode or uneven electrical current resulting in greater risk of skin irritation. Keep out of the reach of children.   DO NOT use if you have a cardiac pacemaker or any other electrically sensitive implanted device. DO NOT use if you have a known sensitivity to dexamethasone. DO NOT use during Magnetic Resonance Imaging (MRI). DO NOT use over broken or compromised skin (e.g. sunburn, cuts, or acne) due to the increased risk of skin reaction. DO NOT SHAVE over the  area to be treated:  To establish good contact between the Patch and the skin, excessive hair may be clipped. DO NOT place the Patch or electrodes on or over your eyes, directly over your heart, or brain. DO NOT reuse the Patch or electrodes as this may cause burns to occur.   Rachel Beasley, PT, [place] Certified Exercise Expert for the Aging Adult  02/06/21 8:22 AM Phone: [phone] Fax: [phone]

## 2021-02-08 ENCOUNTER — Other Ambulatory Visit: Payer: Self-pay

## 2021-02-08 ENCOUNTER — Ambulatory Visit: Payer: 59

## 2021-02-08 DIAGNOSIS — M25511 Pain in right shoulder: Secondary | ICD-10-CM | POA: Diagnosis not present

## 2021-02-08 DIAGNOSIS — M6281 Muscle weakness (generalized): Secondary | ICD-10-CM

## 2021-02-08 DIAGNOSIS — R293 Abnormal posture: Secondary | ICD-10-CM | POA: Diagnosis not present

## 2021-02-08 NOTE — Therapy (Signed)
[place], [place], [address] Phone: [phone]   Fax:  [phone]  Physical Therapy Treatment  Patient Details  Name: Rachel Beasley MRN: [id number] Date of Birth: 1956/04/06 Referring Provider (PT): DrTtim Micheline Chapman DO   Encounter Date: 02/08/2021   PT End of Session - 02/08/21 1400     Visit Number 4    Number of Visits 16    Date for PT Re-Evaluation 03/15/21    Authorization Type UMR    PT Start Time 1400    PT Stop Time 1440    PT Time Calculation (min) 40 min    Activity Tolerance Patient tolerated treatment well    Behavior During Therapy [facility] for tasks assessed/performed             Past Medical History:  Diagnosis Date   Dyspareunia    Endometriosis     Past Surgical History:  Procedure Laterality Date   [place]   benign- endometrioma- laparatomy then danazol 6 months   SPINAL FUSION  2006   C5-6 rupture    There were no vitals filed for this visit.   Subjective Assessment - 02/08/21 1400     Subjective Pt presents to PT with reports of some slight muscle soreness in her R shoulder. She has been compliant with HEP with no adverse effects noted. Pt is ready to begin PT treatment at this time.    Currently in Pain? Yes    Pain Score 3     Pain Location Shoulder    Pain Orientation Right           [facility] Adult PT Treatment/Exercise:   Therapeutic Exercise:  UBE lvl 1.5 x 4 min (32fd/2bwd) while taking subjective Rows 2x10 10lbs Shoulder ext w/ scap retrac 2x10 7lbs R shoulder IR/ER 2x15 green tband Serratus wall slide w/ foam 2x10 yellow tband "[place]" 3x287f Total gym row 2x10 20lbs S/L ER 2x15 3lb Supine serratus punch 2x15 3lbs                               PT Short Term Goals - 02/06/21 0948       PT SHORT TERM GOAL #1   Title Pt will be independent with  intial HEP    Baseline compliant with initial HEP    Time 3    Period Weeks    Status Achieved    Target Date 02/08/21      PT SHORT TERM GOAL #2   Title  Report pain decrease from   5 /10 to   2 /10.    Baseline 2/10 today on anterior shld    Time 3    Period Weeks    Status On-going    Target Date 02/08/21      PT SHORT TERM GOAL #3   Title Pt will be able to increase horizontal adduction to be symmetrical    Baseline horizontal add L 115, R 111    Time 3    Period Weeks    Status On-going               PT Long Term Goals - 01/18/21 1043       PT LONG TERM GOAL #1   Title Pt will be independent with advanced HEP.    Time 8  Period Weeks    Status New    Target Date 02/01/21      PT LONG TERM GOAL #2   Title Pain will decrease to 1/10  or less with all functional activities    Baseline 5/10 and unable to lift above 90 degrees    Time 8    Period Weeks    Status New    Target Date 03/15/21      PT LONG TERM GOAL #3   Title R shoulder AROM flexion will improve to 0-160 degrees for improved overhead reaching    Baseline 133 R AROM with pain    Time 8    Period Weeks    Status New    Target Date 03/15/21      PT LONG TERM GOAL #4   Title FOTO will improve from 53%    to  72%   indicating improved functional mobility .    Baseline eval intake 53%    Time 8    Period Weeks    Status New    Target Date 03/15/21      PT LONG TERM GOAL #5   Title Reach behind head for ADL's without difficulty    Baseline Pt with back scratch test Left handIR/R hand ER 1 inch.  R hand IR/L hand ER 18 inch    Time 8    Period Weeks    Status New    Target Date 03/15/21                   Plan - 02/08/21 1440     Clinical Impression Statement Pt was able to complete all prescribed exercises with no adverse effect or increase in pain. Today's session we continued to focus on increasing RTC and periscapular strength for improving dynamic stability during  overhead movement. Pt is progressing as expected with therapy thus far and will continue to be seen and progressed as tolerated.    PT Treatment/Interventions ADLs/Self Care Home Management;Cryotherapy;Electrical Stimulation;Iontophoresis '4mg'$ /ml Dexamethasone;Moist Heat;Therapeutic exercise;Neuromuscular re-education;Patient/family education;Passive range of motion;Manual techniques;Dry needling;Taping;Joint Manipulations    PT Next Visit Plan try TPDN for post capsule tightness.  review exercise.  post capsule stretch.  and sleeper stretch/ lat stretch.  Progress biceps strengh as able and not irritating shoulder  Progess sub scapular stabilizers.  Less pain when setting shoulder with exercises.    PT Home Exercise Plan [id number]             Patient will benefit from skilled therapeutic intervention in order to improve the following deficits and impairments:  Pain, Improper body mechanics, Postural dysfunction, Impaired UE functional use, Impaired flexibility, Decreased strength, Decreased range of motion  Visit Diagnosis: Acute pain of right shoulder  Muscle weakness (generalized)  Abnormal posture     Problem List Patient Active Problem List   Diagnosis Date Noted   Osteopenia 08/29/2020   Primary insomnia 08/29/2020   Personal history of colonic polyps 08/28/2020   Vitamin D deficiency 08/28/2020   Hormone replacement therapy (HRT) 02/13/2017   Right foot pain 02/22/2013   LATERAL EPICONDYLITIS, BILATERAL 12/26/2009    Ward Chatters, PT 02/08/2021, 2:42 PM  [place] [facility] [address] [place], [place], [address] Phone: [phone]   Fax:  [phone]  Name: Rachel Beasley MRN: [id number] Date of Birth: 1956-05-26

## 2021-02-12 ENCOUNTER — Other Ambulatory Visit: Payer: Self-pay

## 2021-02-12 ENCOUNTER — Encounter: Payer: Self-pay | Admitting: Physical Therapy

## 2021-02-12 ENCOUNTER — Ambulatory Visit: Payer: 59 | Admitting: Physical Therapy

## 2021-02-12 DIAGNOSIS — M25511 Pain in right shoulder: Secondary | ICD-10-CM

## 2021-02-12 DIAGNOSIS — M6281 Muscle weakness (generalized): Secondary | ICD-10-CM | POA: Diagnosis not present

## 2021-02-12 DIAGNOSIS — R293 Abnormal posture: Secondary | ICD-10-CM | POA: Diagnosis not present

## 2021-02-12 NOTE — Therapy (Signed)
[place], [place], [address] Phone: [phone]   Fax:  [phone]  Physical Therapy Treatment  Patient Details  Name: Rachel Beasley MRN: [id number] Date of Birth: Dec 03, 1955 Referring Provider (PT): DrTtim Micheline Chapman DO   Encounter Date: 02/12/2021   PT End of Session - 02/12/21 0847     Visit Number 5    Number of Visits 16    Date for PT Re-Evaluation 03/15/21    Authorization Type UMR    PT Start Time 0846    PT Stop Time 0931    PT Time Calculation (min) 45 min    Activity Tolerance Patient tolerated treatment well             Past Medical History:  Diagnosis Date   Dyspareunia    Endometriosis     Past Surgical History:  Procedure Laterality Date   [place]   benign- endometrioma- laparatomy then danazol 6 months   SPINAL FUSION  2006   C5-6 rupture    There were no vitals filed for this visit.   Subjective Assessment - 02/12/21 0848     Subjective "things are going pretty good. I think the shoulder is getting stronger. still have pain but its not bad maybe a 2/10."    Patient Stated Goals Be able to get back to tennis.  heal shoulder and get stronger    Currently in Pain? Yes    Pain Score 2     Pain Location Shoulder    Pain Orientation Right    Pain Descriptors / Indicators Aching;Sore    Pain Type Chronic pain    Pain Onset More than a month ago    Pain Frequency Intermittent    Aggravating Factors  pushing with the R arm to get up.    Pain Relieving Factors resting, ice,                [facility] PT Assessment - 02/12/21 0001       Assessment   Medical Diagnosis R shoulder pain (labral tear?)    Referring Provider (PT) DrTtim Micheline Chapman DO                       [place] Adult PT Treatment/Exercise:  Therapeutic Exercise: UBE L5 x 4 min (FWD/BWD x 2 min ea.) Sleeper stretch standing 2 x 30  sec RUE only Supine eccentric bicep curl 1 x 15 with RTB 3 way bicep stretch holding 30 sec ea. Scaption 1 x 10 with focus of eccentric lowering   Manual Therapy:  Skilled palpation and monitoring of pt during TPDN IASTM along the bicep brachii and deltoid PA grade III GHJ mobs with low load long duration capsular stretch   Neuromuscular re-ed: N/A  Therapeutic Activity: N/A  Self Care N/A  ITEMS NOT PERFORMED TODAY:       Trigger Point Dry Needling - 02/12/21 0001     Consent Given? Yes    Muscles Treated Head and Neck Upper trapezius    Muscles Treated Upper Quadrant Biceps;Infraspinatus    Upper Trapezius Response Twitch reponse elicited;Palpable increased muscle length    Pectoralis Major Response Twitch response elicited;Palpable increased muscle length    Pectoralis Minor Response Twitch response elicited;Palpable increased muscle length    Infraspinatus Response Twitch response elicited;Palpable increased muscle length    Subscapularis Response Twitch response elicited;Palpable increased muscle  length    Deltoid Response Twitch response elicited;Palpable increased muscle length    Biceps Response Twitch response elicited;Palpable increased muscle length   R                  PT Education - 02/12/21 0931     Education Details updated HEP to include scaption, bicep stretch and sleeper stretch    Person(s) Educated Patient    Methods Explanation;Verbal cues;Handout    Comprehension Verbalized understanding;Verbal cues required              PT Short Term Goals - 02/06/21 0948       PT SHORT TERM GOAL #1   Title Pt will be independent with intial HEP    Baseline compliant with initial HEP    Time 3    Period Weeks    Status Achieved    Target Date 02/08/21      PT SHORT TERM GOAL #2   Title  Report pain decrease from   5 /10 to   2 /10.    Baseline 2/10 today on anterior shld    Time 3    Period Weeks    Status On-going    Target Date  02/08/21      PT SHORT TERM GOAL #3   Title Pt will be able to increase horizontal adduction to be symmetrical    Baseline horizontal add L 115, R 111    Time 3    Period Weeks    Status On-going               PT Long Term Goals - 01/18/21 1043       PT LONG TERM GOAL #1   Title Pt will be independent with advanced HEP.    Time 8    Period Weeks    Status New    Target Date 02/01/21      PT LONG TERM GOAL #2   Title Pain will decrease to 1/10  or less with all functional activities    Baseline 5/10 and unable to lift above 90 degrees    Time 8    Period Weeks    Status New    Target Date 03/15/21      PT LONG TERM GOAL #3   Title R shoulder AROM flexion will improve to 0-160 degrees for improved overhead reaching    Baseline 133 R AROM with pain    Time 8    Period Weeks    Status New    Target Date 03/15/21      PT LONG TERM GOAL #4   Title FOTO will improve from 53%    to  72%   indicating improved functional mobility .    Baseline eval intake 53%    Time 8    Period Weeks    Status New    Target Date 03/15/21      PT LONG TERM GOAL #5   Title Reach behind head for ADL's without difficulty    Baseline Pt with back scratch test Left handIR/R hand ER 1 inch.  R hand IR/L hand ER 18 inch    Time 8    Period Weeks    Status New    Target Date 03/15/21                   Plan - 02/12/21 0932     Clinical Impression Statement Rachel Beasley is making good progress with physical therapy increasing  shoulder ROM. continued TPDN for the R shoulder followed with IASTM techniques, and stretching. She visually increased her shoulder IR following treatment compared to before and is limited by 2 thoracic segments compared bil. End of session she noted some soreness which is likely from the DN.    PT Treatment/Interventions ADLs/Self Care Home Management;Cryotherapy;Electrical Stimulation;Iontophoresis '4mg'$ /ml Dexamethasone;Moist Heat;Therapeutic  exercise;Neuromuscular re-education;Patient/family education;Passive range of motion;Manual techniques;Dry needling;Taping;Joint Manipulations    PT Next Visit Plan TPDN PRN,  review exercise.  post capsule stretch.  and sleeper stretch/ lat stretch.  Progress biceps strengh as able and not irritating shoulder  Progess sub scapular stabilizers.  Less pain when setting shoulder with exercises.    PT Home Exercise Plan [id number]             Patient will benefit from skilled therapeutic intervention in order to improve the following deficits and impairments:  Pain, Improper body mechanics, Postural dysfunction, Impaired UE functional use, Impaired flexibility, Decreased strength, Decreased range of motion  Visit Diagnosis: Acute pain of right shoulder     Problem List Patient Active Problem List   Diagnosis Date Noted   Osteopenia 08/29/2020   Primary insomnia 08/29/2020   Personal history of colonic polyps 08/28/2020   Vitamin D deficiency 08/28/2020   Hormone replacement therapy (HRT) 02/13/2017   Right foot pain 02/22/2013   LATERAL EPICONDYLITIS, BILATERAL 12/26/2009    [place]arr [place] PT, DPT, LAT, ATC  02/12/21  9:34 AM      [place] [facility] [address] [place], [place], [address] Phone: [phone]   Fax:  [phone]  Name: Rachel Beasley MRN: [id number] Date of Birth: [birth year]

## 2021-02-14 ENCOUNTER — Encounter: Payer: Self-pay | Admitting: Physical Therapy

## 2021-02-14 ENCOUNTER — Ambulatory Visit: Payer: 59 | Admitting: Physical Therapy

## 2021-02-14 ENCOUNTER — Other Ambulatory Visit: Payer: Self-pay

## 2021-02-14 DIAGNOSIS — M25511 Pain in right shoulder: Secondary | ICD-10-CM

## 2021-02-14 DIAGNOSIS — R293 Abnormal posture: Secondary | ICD-10-CM | POA: Diagnosis not present

## 2021-02-14 DIAGNOSIS — M6281 Muscle weakness (generalized): Secondary | ICD-10-CM

## 2021-02-14 NOTE — Therapy (Signed)
[place], [place], [address] Phone: [phone]   Fax:  [phone]  Physical Therapy Treatment  Patient Details  Name: Rachel Beasley MRN: [id number] Date of Birth: 1955/06/08 Referring Provider (PT): DrTtim Micheline Chapman DO   Encounter Date: 02/14/2021   PT End of Session - 02/14/21 0849     Visit Number 6    Number of Visits 16    Date for PT Re-Evaluation 03/15/21    Authorization Type UMR    PT Start Time 0846    PT Stop Time 0930    PT Time Calculation (min) 44 min             Past Medical History:  Diagnosis Date   Dyspareunia    Endometriosis     Past Surgical History:  Procedure Laterality Date   [place]   benign- endometrioma- laparatomy then danazol 6 months   SPINAL FUSION  2006   C5-6 rupture    There were no vitals filed for this visit.   Subjective Assessment - 02/14/21 0851     Subjective " I was pretty sore after the last sessionbut today I am doing pretty good."    Currently in Pain? No/denies    Pain Score 0-No pain    Pain Location Shoulder    Pain Orientation Right                      [facility] Adult PT Treatment/Exercise:  Therapeutic Exercise: UBE L5 x 6 min - FWD/BWD x 3 min ea Bil upper trap/ levator scapulae stretch 1 x 30 ea. PNF D1/D2 2 x 12  with RTB in standing bil Palloff press 2 x 15 bil with RTB  Manual Therapy:  Trigger point release along bil upper trap Reviewed how it can be done at home    Neuromuscular re-ed: N/A  Therapeutic Activity: N/A  Self Care Self trigger point release using tools to assist treatment  ITEMS NOT PERFORMED TODAY: Sleeper stretch standing 2 x 30 sec RUE only Supine eccentric bicep curl 1 x 15 with RTB 3 way bicep stretch holding 30 sec ea. Scaption 1 x 10 with focus of eccentric lowering  Skilled palpation and monitoring of pt during  TPDN IASTM along the bicep brachii and deltoid PA grade III GHJ mobs with low load long duration capsular stretch                    PT Education - 02/14/21 0853     Education Details Reviewed 6th visit FOTO assessment, benefits of self trigger point release., updated HEP today    Person(s) Educated Patient    Methods Explanation;Verbal cues    Comprehension Verbalized understanding;Verbal cues required              PT Short Term Goals - 02/06/21 0948       PT SHORT TERM GOAL #1   Title Pt will be independent with intial HEP    Baseline compliant with initial HEP    Time 3    Period Weeks    Status Achieved    Target Date 02/08/21      PT SHORT TERM GOAL #2   Title  Report pain decrease from   5 /10 to   2 /10.    Baseline 2/10 today on anterior shld    Time 3  Period Weeks    Status On-going    Target Date 02/08/21      PT SHORT TERM GOAL #3   Title Pt will be able to increase horizontal adduction to be symmetrical    Baseline horizontal add L 115, R 111    Time 3    Period Weeks    Status On-going               PT Long Term Goals - 01/18/21 1043       PT LONG TERM GOAL #1   Title Pt will be independent with advanced HEP.    Time 8    Period Weeks    Status New    Target Date 02/01/21      PT LONG TERM GOAL #2   Title Pain will decrease to 1/10  or less with all functional activities    Baseline 5/10 and unable to lift above 90 degrees    Time 8    Period Weeks    Status New    Target Date 03/15/21      PT LONG TERM GOAL #3   Title R shoulder AROM flexion will improve to 0-160 degrees for improved overhead reaching    Baseline 133 R AROM with pain    Time 8    Period Weeks    Status New    Target Date 03/15/21      PT LONG TERM GOAL #4   Title FOTO will improve from 53%    to  72%   indicating improved functional mobility .    Baseline eval intake 53%    Time 8    Period Weeks    Status New    Target Date 03/15/21       PT LONG TERM GOAL #5   Title Reach behind head for ADL's without difficulty    Baseline Pt with back scratch test Left handIR/R hand ER 1 inch.  R hand IR/L hand ER 18 inch    Time 8    Period Weeks    Status New    Target Date 03/15/21                   Plan - 02/14/21 0944     Clinical Impression Statement pt arrives reporting no pain today and opted to hold off on TPDN. focused on self trigger point release and how it can be done at home. continued working on scapular stability and shoulder strengthening working into overhead positiong with PNF angles which she did well with. end of session she noted no increase in pain. plan to continue progressing scapular stability and gross shoulder strengthening.    PT Treatment/Interventions ADLs/Self Care Home Management;Cryotherapy;Electrical Stimulation;Iontophoresis '4mg'$ /ml Dexamethasone;Moist Heat;Therapeutic exercise;Neuromuscular re-education;Patient/family education;Passive range of motion;Manual techniques;Dry needling;Taping;Joint Manipulations    PT Next Visit Plan TPDN PRN,  review exercise.  Progress biceps strengh as able and not irritating shoulder  Progess sub scapular stabilizers.  .  lift/ chop scapular stabiliyt    PT Home Exercise Plan [place] and Agree with Plan of Care Patient             Patient will benefit from skilled therapeutic intervention in order to improve the following deficits and impairments:  Pain, Improper body mechanics, Postural dysfunction, Impaired UE functional use, Impaired flexibility, Decreased strength, Decreased range of motion  Visit Diagnosis: Acute pain of right shoulder  Muscle weakness (generalized)  Abnormal posture     Problem  List Patient Active Problem List   Diagnosis Date Noted   Osteopenia 08/29/2020   Primary insomnia 08/29/2020   Personal history of colonic polyps 08/28/2020   Vitamin D deficiency 08/28/2020   Hormone replacement therapy (HRT)  02/13/2017   Right foot pain 02/22/2013   LATERAL EPICONDYLITIS, BILATERAL 12/26/2009   Starr Lake PT, DPT, LAT, ATC  02/14/21  9:50 AM      [facility] [address] [place], [place], [address] Phone: [phone]   Fax:  [phone]  Name: Rachel Beasley MRN: [id number] Date of Birth: 08-18-1955

## 2021-02-19 ENCOUNTER — Ambulatory Visit: Payer: 59 | Admitting: Physical Therapy

## 2021-02-19 ENCOUNTER — Other Ambulatory Visit: Payer: Self-pay

## 2021-02-19 DIAGNOSIS — M6281 Muscle weakness (generalized): Secondary | ICD-10-CM | POA: Diagnosis not present

## 2021-02-19 DIAGNOSIS — M25511 Pain in right shoulder: Secondary | ICD-10-CM

## 2021-02-19 DIAGNOSIS — R293 Abnormal posture: Secondary | ICD-10-CM | POA: Diagnosis not present

## 2021-02-19 NOTE — Therapy (Signed)
[place], [place], [address] Phone: [phone]   Fax:  [phone]  Physical Therapy Treatment  Patient Details  Name: Rachel Beasley MRN: [id number] Date of Birth: 1955-07-09 Referring Provider (PT): DrTtim Micheline Chapman DO   Encounter Date: 02/19/2021   PT End of Session - 02/19/21 0849     Visit Number 7    Number of Visits 16    Date for PT Re-Evaluation 03/15/21    Authorization Type UMR    PT Start Time 0848    PT Stop Time 0929    PT Time Calculation (min) 41 min    Activity Tolerance Patient tolerated treatment well             Past Medical History:  Diagnosis Date   Dyspareunia    Endometriosis     Past Surgical History:  Procedure Laterality Date   [place]   benign- endometrioma- laparatomy then danazol 6 months   SPINAL FUSION  2006   C5-6 rupture    There were no vitals filed for this visit.   Subjective Assessment - 02/19/21 0851     Subjective "I was alittle sore after the last session which did make me feel nervous. I am doing better today though."    Patient Stated Goals Be able to get back to tennis.  heal shoulder and get stronger    Currently in Pain? Yes    Pain Score 3     Pain Location Shoulder    Pain Orientation Right                 [facility] Adult PT Treatment/Exercise:   Therapeutic Exercise: UBE L1 x 6 min - FWD/BWD x 2:30 min ea Bil upper trap/ levator scapulae stretch 1 x 30 ea. Scapular protraction with rhythmic stabilization 3 x 30 sec Lowe trap strengthening 2x 15 with elbows propped on bolster 3 way bicep stretch holding 30 sec ea. Scaption 1 x 10 with focus of eccentric lowering Bicep curl 1 x 15 with focus eccentric lowering x 5 sec Supine PPT 1 x 5 holding 5 seconds Tactile cues for proper form Dead bug 1 x 10 holding 10 sec each Cues for posterior pelvic tilt and ab set  during Standing physioball press with bil UE 1 x 10 holding 5 sec ea.   Manual Therapy: N/A   Neuromuscular re-ed: N/A   Therapeutic Activity: N/A   Self Care N/A    ITEMS NOT PERFORMED TODAY: Therapeutic Exercise: Sleeper stretch standing 2 x 30 sec RUE only Supine eccentric bicep curl 1 x 15 with RTB Palloff press 2 x 15 bil with RTB PNF D1/D2 2 x 12  with RTB in standing bil  Manual Therapy: Skilled palpation and monitoring of pt during TPDN IASTM along the bicep brachii and deltoid PA grade III GHJ mobs with low load long duration capsular stretch  Trigger point release along bil upper trap Reviewed how it can be done at home   Neuromuscular re-ed:   Therapeutic Activity:                    PT Education - 02/19/21 0929     Education Details updated HEP for serratus punch, and described lower trap strengthening    Person(s) Educated Patient    Methods Explanation;Verbal cues;Handout    Comprehension Verbalized understanding;Verbal cues required  PT Short Term Goals - 02/06/21 0948       PT SHORT TERM GOAL #1   Title Pt will be independent with intial HEP    Baseline compliant with initial HEP    Time 3    Period Weeks    Status Achieved    Target Date 02/08/21      PT SHORT TERM GOAL #2   Title  Report pain decrease from   5 /10 to   2 /10.    Baseline 2/10 today on anterior shld    Time 3    Period Weeks    Status On-going    Target Date 02/08/21      PT SHORT TERM GOAL #3   Title Pt will be able to increase horizontal adduction to be symmetrical    Baseline horizontal add L 115, R 111    Time 3    Period Weeks    Status On-going               PT Long Term Goals - 01/18/21 1043       PT LONG TERM GOAL #1   Title Pt will be independent with advanced HEP.    Time 8    Period Weeks    Status New    Target Date 02/01/21      PT LONG TERM GOAL #2   Title Pain will decrease to 1/10  or less with all  functional activities    Baseline 5/10 and unable to lift above 90 degrees    Time 8    Period Weeks    Status New    Target Date 03/15/21      PT LONG TERM GOAL #3   Title R shoulder AROM flexion will improve to 0-160 degrees for improved overhead reaching    Baseline 133 R AROM with pain    Time 8    Period Weeks    Status New    Target Date 03/15/21      PT LONG TERM GOAL #4   Title FOTO will improve from 53%    to  72%   indicating improved functional mobility .    Baseline eval intake 53%    Time 8    Period Weeks    Status New    Target Date 03/15/21      PT LONG TERM GOAL #5   Title Reach behind head for ADL's without difficulty    Baseline Pt with back scratch test Left handIR/R hand ER 1 inch.  R hand IR/L hand ER 18 inch    Time 8    Period Weeks    Status New    Target Date 03/15/21                   Plan - 02/19/21 0913     Clinical Impression Statement Rachel Beasley arrives reporting some increase in shoulder soreness which appears to be related to delayed onset muscle soreness. Focused today's session on scapular stability to maximize scapulohumeral rhythm which she did well with. Continued working on core strengthening today to assist with shoulder stability. pt noted soreness decreased in the shoulder during and following session.    PT Treatment/Interventions ADLs/Self Care Home Management;Cryotherapy;Electrical Stimulation;Iontophoresis '4mg'$ /ml Dexamethasone;Moist Heat;Therapeutic exercise;Neuromuscular re-education;Patient/family education;Passive range of motion;Manual techniques;Dry needling;Taping;Joint Manipulations    PT Next Visit Plan TPDN PRN,  review exercise.  Progress biceps strengh as able and not irritating shoulder  Progess sub scapular stabilizers.  Marland Kitchen  lift/ chop scapular stability    PT Home Exercise Plan [place] and Agree with Plan of Care Patient             Patient will benefit from skilled therapeutic intervention in  order to improve the following deficits and impairments:  Pain, Improper body mechanics, Postural dysfunction, Impaired UE functional use, Impaired flexibility, Decreased strength, Decreased range of motion  Visit Diagnosis: Acute pain of right shoulder  Muscle weakness (generalized)  Abnormal posture     Problem List Patient Active Problem List   Diagnosis Date Noted   Osteopenia 08/29/2020   Primary insomnia 08/29/2020   Personal history of colonic polyps 08/28/2020   Vitamin D deficiency 08/28/2020   Hormone replacement therapy (HRT) 02/13/2017   Right foot pain 02/22/2013   LATERAL EPICONDYLITIS, BILATERAL 12/26/2009   Starr Lake PT, DPT, LAT, ATC  02/19/21  9:31 AM      [facility] [address] [place], [place], [address] Phone: [phone]   Fax:  [phone]  Name: Rachel Beasley MRN: [id number] Date of Birth: [birth year]

## 2021-02-21 ENCOUNTER — Other Ambulatory Visit: Payer: Self-pay

## 2021-02-21 ENCOUNTER — Encounter: Payer: Self-pay | Admitting: Physical Therapy

## 2021-02-21 ENCOUNTER — Ambulatory Visit: Payer: 59 | Admitting: Physical Therapy

## 2021-02-21 DIAGNOSIS — M25511 Pain in right shoulder: Secondary | ICD-10-CM

## 2021-02-21 DIAGNOSIS — M6281 Muscle weakness (generalized): Secondary | ICD-10-CM | POA: Diagnosis not present

## 2021-02-21 DIAGNOSIS — R293 Abnormal posture: Secondary | ICD-10-CM | POA: Diagnosis not present

## 2021-02-21 NOTE — Therapy (Signed)
[name], [place], [address] Phone: [phone]   Fax:  [phone]  Physical Therapy Treatment  Patient Details  Name: Rachel Beasley MRN: [id number] Date of Birth: 1956-03-06 Referring Provider (PT): DrTtim Micheline Chapman DO   Encounter Date: 02/21/2021   PT End of Session - 02/21/21 0851     Visit Number 8    Number of Visits 16    Date for PT Re-Evaluation 03/15/21    Authorization Type UMR    PT Start Time 0848    PT Stop Time 0930    PT Time Calculation (min) 42 min    Activity Tolerance Patient tolerated treatment well    Behavior During Therapy [facility] for tasks assessed/performed             Past Medical History:  Diagnosis Date   Dyspareunia    Endometriosis     Past Surgical History:  Procedure Laterality Date   [place]   benign- endometrioma- laparatomy then danazol 6 months   SPINAL FUSION  2006   C5-6 rupture    There were no vitals filed for this visit.   Subjective Assessment - 02/21/21 0851     Subjective " I am doing pretty good, I was able to do more exercises a the gym."    Patient Stated Goals Be able to get back to tennis.  heal shoulder and get stronger    Currently in Pain? Yes    Pain Score 2     Pain Orientation Right    Pain Descriptors / Indicators Aching;Sore    Pain Type Chronic pain    Pain Onset More than a month ago    Pain Frequency Intermittent    Aggravating Factors  reaching behind the back and head    Pain Relieving Factors rest, ice                [facility] PT Assessment - 02/21/21 0001       Assessment   Medical Diagnosis R shoulder pain (labral tear?)    Referring Provider (PT) DrTtim Micheline Chapman DO                  [facility] Adult PT Treatment/Exercise:  Therapeutic Exercise: Rhythmic stabilization 3 x 30 sec Supine sustained horizontal abduction with RTB combined with  shoulder flexion  2 x 10  Scapular protraction pressing into wall combined with shoulder flexion 1 x 15  Manual Therapy: Skilled palpation and monitoring of pt during TPDN  IASTM along the R bicep GHJ PA grade III in supine and prone positioning RUE Added low load long duratiion stretch for anterior joint capsule.  Neuromuscular re-ed: N/A  Therapeutic Activity: N/A  Self Care: N/A  ITEMS NOT PERFORMED TODAY:            Trigger Point Dry Needling - 02/21/21 0001     Consent Given? Yes    Education Handout Provided Yes    Electrical Stimulation Performed with Dry Needling Yes    E-stim with Dry Needling Details CPS Lvel 20 x 8 min adjusting intensity to tolerance    Biceps Response Twitch response elicited;Palpable increased muscle length                     PT Short Term Goals - 02/06/21 0948       PT SHORT TERM GOAL #1   Title  Pt will be independent with intial HEP    Baseline compliant with initial HEP    Time 3    Period Weeks    Status Achieved    Target Date 02/08/21      PT SHORT TERM GOAL #2   Title  Report pain decrease from   5 /10 to   2 /10.    Baseline 2/10 today on anterior shld    Time 3    Period Weeks    Status On-going    Target Date 02/08/21      PT SHORT TERM GOAL #3   Title Pt will be able to increase horizontal adduction to be symmetrical    Baseline horizontal add L 115, R 111    Time 3    Period Weeks    Status On-going               PT Long Term Goals - 01/18/21 1043       PT LONG TERM GOAL #1   Title Pt will be independent with advanced HEP.    Time 8    Period Weeks    Status New    Target Date 02/01/21      PT LONG TERM GOAL #2   Title Pain will decrease to 1/10  or less with all functional activities    Baseline 5/10 and unable to lift above 90 degrees    Time 8    Period Weeks    Status New    Target Date 03/15/21      PT LONG TERM GOAL #3   Title R shoulder AROM flexion will improve to  0-160 degrees for improved overhead reaching    Baseline 133 R AROM with pain    Time 8    Period Weeks    Status New    Target Date 03/15/21      PT LONG TERM GOAL #4   Title FOTO will improve from 53%    to  72%   indicating improved functional mobility .    Baseline eval intake 53%    Time 8    Period Weeks    Status New    Target Date 03/15/21      PT LONG TERM GOAL #5   Title Reach behind head for ADL's without difficulty    Baseline Pt with back scratch test Left handIR/R hand ER 1 inch.  R hand IR/L hand ER 18 inch    Time 8    Period Weeks    Status New    Target Date 03/15/21                   Plan - 02/21/21 0175     Clinical Impression Statement pt continues to make great progress with physical therapy. She continues to describe anterior shoulder stiffness mostly when reaching behind her head/ back. continued TPDN focusing on the bicep combined with Estim followed with IASTM techniques. worked on [employer] to promote the ROM deficits she was noting. continued working on scapular stability to facilitate scapulohumeral rhythm. end of session she noted feeling reduced stiffness/ soreness.    PT Treatment/Interventions ADLs/Self Care Home Management;Cryotherapy;Electrical Stimulation;Iontophoresis 4mg /ml Dexamethasone;Moist Heat;Therapeutic exercise;Neuromuscular re-education;Patient/family education;Passive range of motion;Manual techniques;Dry needling;Taping;Joint Manipulations    PT Next Visit Plan TPDN PRN,  review exercise.  Progress biceps strengh as able and not irritating shoulder  Progess sub scapular stabilizers.  .  lift/ chop scapular stability, hows reaching behind head/ back.  Patient will benefit from skilled therapeutic intervention in order to improve the following deficits and impairments:  Pain, Improper body mechanics, Postural dysfunction, Impaired UE functional use, Impaired flexibility, Decreased strength, Decreased range of  motion  Visit Diagnosis: Acute pain of right shoulder  Muscle weakness (generalized)  Abnormal posture     Problem List Patient Active Problem List   Diagnosis Date Noted   Osteopenia 08/29/2020   Primary insomnia 08/29/2020   Personal history of colonic polyps 08/28/2020   Vitamin D deficiency 08/28/2020   Hormone replacement therapy (HRT) 02/13/2017   Right foot pain 02/22/2013   LATERAL EPICONDYLITIS, BILATERAL 12/26/2009    Starr Lake PT, DPT, LAT, ATC  02/21/21  9:33 AM      [facility] [address] [place], [place], [address] Phone: [phone]   Fax:  [phone]  Name: PRAPTI GRUSSING MRN: [id number] Date of Birth: January 16, 1956

## 2021-02-23 ENCOUNTER — Other Ambulatory Visit (HOSPITAL_COMMUNITY): Payer: Self-pay

## 2021-02-26 ENCOUNTER — Ambulatory Visit: Payer: 59 | Admitting: Physical Therapy

## 2021-02-26 ENCOUNTER — Other Ambulatory Visit: Payer: Self-pay

## 2021-02-26 DIAGNOSIS — R293 Abnormal posture: Secondary | ICD-10-CM | POA: Diagnosis not present

## 2021-02-26 DIAGNOSIS — M6281 Muscle weakness (generalized): Secondary | ICD-10-CM

## 2021-02-26 DIAGNOSIS — M25511 Pain in right shoulder: Secondary | ICD-10-CM

## 2021-02-26 NOTE — Therapy (Signed)
[place], [place], [address] Phone: [phone]   Fax:  [phone]  Physical Therapy Treatment  Patient Details  Name: Rachel Beasley MRN: [id number] Date of Birth: [birth year] Referring Provider (PT): DrTtim Micheline Chapman DO   Encounter Date: 02/26/2021   PT End of Session - 02/26/21 0842     Visit Number 9    Number of Visits 16    Date for PT Re-Evaluation 03/15/21    Authorization Type UMR    PT Start Time 0843    PT Stop Time 0928    PT Time Calculation (min) 45 min    Activity Tolerance Patient tolerated treatment well    Behavior During Therapy [facility] for tasks assessed/performed             Past Medical History:  Diagnosis Date   Dyspareunia    Endometriosis     Past Surgical History:  Procedure Laterality Date   [place]   benign- endometrioma- laparatomy then danazol 6 months   SPINAL FUSION  2006   C5-6 rupture    There were no vitals filed for this visit.   Subjective Assessment - 02/26/21 0843     Subjective "Overall I am feeling pretty good."                     [facility] Adult PT Treatment/Exercise:  Therapeutic Exercise: Supine scapular protraction with rhythmic stabilization 3 x 30  Supine sustained horizontal abduction with RTB combined with shoulder flexion  2 x 10  UBE L 3 x 4 min (fwd/bwd x 2 min ea.) Supine eccentric bicep curl 2 x 12 with BTB Scaption angle lifting 2 x 10 3# Brachialis bicep curl 3 x 10 with 3# Body blade  3 x 30 sec IR/ER Bicep curl 3 x 30 sec   Manual Therapy: MTPR focus on R sub-clavius  Distal Clavicle mobs inferior/ posterior grade III  Neuromuscular re-ed: N/A  Therapeutic Activity: N/A  Self Care: N/A  ITEMS NOT PERFORMED TODAY:                      PT Short Term Goals - 02/06/21 0948       PT SHORT TERM GOAL #1   Title Pt will be  independent with intial HEP    Baseline compliant with initial HEP    Time 3    Period Weeks    Status Achieved    Target Date 02/08/21      PT SHORT TERM GOAL #2   Title  Report pain decrease from   5 /10 to   2 /10.    Baseline 2/10 today on anterior shld    Time 3    Period Weeks    Status On-going    Target Date 02/08/21      PT SHORT TERM GOAL #3   Title Pt will be able to increase horizontal adduction to be symmetrical    Baseline horizontal add L 115, R 111    Time 3    Period Weeks    Status On-going               PT Long Term Goals - 01/18/21 1043       PT LONG TERM GOAL #1   Title Pt will be independent with advanced HEP.    Time 8  Period Weeks    Status New    Target Date 02/01/21      PT LONG TERM GOAL #2   Title Pain will decrease to 1/10  or less with all functional activities    Baseline 5/10 and unable to lift above 90 degrees    Time 8    Period Weeks    Status New    Target Date 03/15/21      PT LONG TERM GOAL #3   Title R shoulder AROM flexion will improve to 0-160 degrees for improved overhead reaching    Baseline 133 R AROM with pain    Time 8    Period Weeks    Status New    Target Date 03/15/21      PT LONG TERM GOAL #4   Title FOTO will improve from 53%    to  72%   indicating improved functional mobility .    Baseline eval intake 53%    Time 8    Period Weeks    Status New    Target Date 03/15/21      PT LONG TERM GOAL #5   Title Reach behind head for ADL's without difficulty    Baseline Pt with back scratch test Left handIR/R hand ER 1 inch.  R hand IR/L hand ER 18 inch    Time 8    Period Weeks    Status New    Target Date 03/15/21                   Plan - 02/26/21 4627     Clinical Impression Statement Mrs Takeda continues to make good progress with physical therap reporting only mild soreness located along the [facility] joint which she noted some relief following distal clavicle mobs. continued working on  scapular stability and eccetric bicepbrahcii strengthening to maximize tendon remodeling, as well as additional bicep strengthening vis brachialis to promote strength. She completed all exercise today with no report of pain or issues, plan to reassess next session and determine if more PT is warranted.    PT Treatment/Interventions ADLs/Self Care Home Management;Cryotherapy;Electrical Stimulation;Iontophoresis 4mg /ml Dexamethasone;Moist Heat;Therapeutic exercise;Neuromuscular re-education;Patient/family education;Passive range of motion;Manual techniques;Dry needling;Taping;Joint Manipulations    PT Next Visit Plan [place],   review exercise.  TPDN PRN.  lift/ chop scapular stability, hows reaching behind head/ back.    PT Home Exercise Plan [place] and Agree with Plan of Care Patient             Patient will benefit from skilled therapeutic intervention in order to improve the following deficits and impairments:  Pain, Improper body mechanics, Postural dysfunction, Impaired UE functional use, Impaired flexibility, Decreased strength, Decreased range of motion  Visit Diagnosis: Muscle weakness (generalized)  Acute pain of right shoulder  Abnormal posture     Problem List Patient Active Problem List   Diagnosis Date Noted   Osteopenia 08/29/2020   Primary insomnia 08/29/2020   Personal history of colonic polyps 08/28/2020   Vitamin D deficiency 08/28/2020   Hormone replacement therapy (HRT) 02/13/2017   Right foot pain 02/22/2013   LATERAL EPICONDYLITIS, BILATERAL 12/26/2009   Starr Lake PT, DPT, LAT, ATC  02/26/21  9:32 AM     [facility] [address] [place], [place], [address] Phone: [phone]   Fax:  [phone]  Name: PHOENYX MELKA MRN: [id number] Date of Birth: [birth year]

## 2021-02-27 DIAGNOSIS — L821 Other seborrheic keratosis: Secondary | ICD-10-CM | POA: Diagnosis not present

## 2021-02-27 DIAGNOSIS — L570 Actinic keratosis: Secondary | ICD-10-CM | POA: Diagnosis not present

## 2021-02-27 DIAGNOSIS — D224 Melanocytic nevi of scalp and neck: Secondary | ICD-10-CM | POA: Diagnosis not present

## 2021-02-27 DIAGNOSIS — D1801 Hemangioma of skin and subcutaneous tissue: Secondary | ICD-10-CM | POA: Diagnosis not present

## 2021-02-27 DIAGNOSIS — D225 Melanocytic nevi of trunk: Secondary | ICD-10-CM | POA: Diagnosis not present

## 2021-02-28 ENCOUNTER — Ambulatory Visit: Payer: 59 | Admitting: Physical Therapy

## 2021-03-02 ENCOUNTER — Encounter: Payer: Self-pay | Admitting: Physical Therapy

## 2021-03-02 ENCOUNTER — Ambulatory Visit: Payer: 59 | Admitting: Physical Therapy

## 2021-03-02 ENCOUNTER — Other Ambulatory Visit: Payer: Self-pay

## 2021-03-02 DIAGNOSIS — R293 Abnormal posture: Secondary | ICD-10-CM | POA: Diagnosis not present

## 2021-03-02 DIAGNOSIS — M6281 Muscle weakness (generalized): Secondary | ICD-10-CM

## 2021-03-02 DIAGNOSIS — M25511 Pain in right shoulder: Secondary | ICD-10-CM | POA: Diagnosis not present

## 2021-03-02 NOTE — Therapy (Signed)
[place], [place], [address] Phone: [phone]   Fax:  [phone]  Physical Therapy Treatment/ERO  Patient Details  Name: LUTIE PICKLER MRN: [id number] Date of Birth: Jan 08, 1956 Referring Provider (PT): DrTtim Micheline Chapman DO   Encounter Date: 03/02/2021   PT End of Session - 03/02/21 1145     Visit Number 10    Number of Visits 16    Date for PT Re-Evaluation 04/13/21    Authorization Type UMR    PT Start Time 1110    PT Stop Time 1200    PT Time Calculation (min) 50 min    Activity Tolerance Patient tolerated treatment well    Behavior During Therapy [facility] for tasks assessed/performed             Past Medical History:  Diagnosis Date   Dyspareunia    Endometriosis     Past Surgical History:  Procedure Laterality Date   [place]   benign- endometrioma- laparatomy then danazol 6 months   SPINAL FUSION  2006   C5-6 rupture    There were no vitals filed for this visit.   Subjective Assessment - 03/02/21 1119     Subjective overall I am feeling pretty good.  but the end range of me reaching with my R arm hurts    Limitations Lifting   serving in tennis over head strokes   How long can you sit comfortably? unlimited    How long can you stand comfortably? unlimited    How long can you walk comfortably? unlimited    Diagnostic tests Ultrasound with fluid on biceps tendon    Patient Stated Goals Be able to get back to tennis.  heal shoulder and get stronger    Currently in Pain? Yes    Pain Score 7    at rest 0/10 but overhead motion   Pain Location Shoulder    Pain Orientation Right    Pain Descriptors / Indicators Aching;Sore    Pain Type Chronic pain                [facility] PT Assessment - 03/02/21 0001       Assessment   Medical Diagnosis R shoulder pain (labral tear?)    Referring Provider (PT) DrTtim Micheline Chapman DO     Onset Date/Surgical Date 11/09/20   4 month historyof pain   Hand Dominance Right      Precautions   Precautions None      Balance Screen   Has the patient fallen in the past 6 months No    Has the patient had a decrease in activity level because of a fear of falling?  No    Is the patient reluctant to leave their home because of a fear of falling?  No      Prior Function   Level of Independence Independent      Cognition   Overall Cognitive Status Within Functional Limits for tasks assessed      Observation/Other Assessments   Focus on Therapeutic Outcomes (FOTO)  Intake 63%, predicted 72%      AROM   Overall AROM Comments Back scratch test 1.5 inch  L IR to R ER,  4 inche R IR to L ER    Right Shoulder Flexion 148 Degrees   PROM 152   Right Shoulder ABduction 139 Degrees   142   Right  Shoulder Internal Rotation 52 Degrees    Right Shoulder External Rotation 95 Degrees    Right Shoulder Horizontal  ADduction 116 Degrees    Left Shoulder Flexion 168 Degrees    Left Shoulder ABduction 160 Degrees    Left Shoulder Internal Rotation 60 Degrees    Left Shoulder External Rotation 96 Degrees    Left Shoulder Horizontal ADduction 116 Degrees      Strength   Overall Strength Deficits    Right Shoulder Flexion 4+/5    Right Shoulder ABduction 4/5    Right Shoulder Internal Rotation 4-/5    Right Shoulder External Rotation 4+/5    Left Shoulder Flexion 5/5    Left Shoulder ABduction 5/5    Left Shoulder Internal Rotation 5/5    Left Shoulder External Rotation 5/5      Palpation   Palpation comment TTP over prox biceps long head insertion,              [facility] Adult PT Treatment/Exercise:   Therapeutic Exercise: Supine scapular protraction with rhythmic stabilization 3 x 30  Supine sustained horizontal abduction with RTB combined with shoulder flexion  2 x 10  UBE L 3 x 4 min (fwd/bwd x 2 min ea.) Supine eccentric bicep curl 2 x 12 with BTB BTB with lat pulls 3 x 10 on  R  Prone on physio ball with shld row and ER x x 10 with 2 lb wet Shoulder taps full plank x 10 Push up on step  /mat 2 x 5 Single arm shld ER in abd with anchored resisitance red t band 2 x 10    Self Care  Community wellness opportunites discussion   Not done this visit     Scaption angle lifting 2 x 10 3# Brachialis bicep curl 3 x 10 with 3# 3. Body blade  3 x 30 sec IR/ER Bicep curl 3 x 30 sec                     PT Education - 03/02/21 1221     Education Details added strength to HEP to enhance ability to perform serving/overhead reach in tennis,  Self care- communiity wellness opportunities.    Person(s) Educated Patient    Methods Explanation;Demonstration;Tactile cues;Verbal cues;Handout    Comprehension Verbalized understanding;Returned demonstration              PT Short Term Goals - 03/02/21 1156       PT SHORT TERM GOAL #1   Title Pt will be independent with intial HEP    Baseline compliant with initial HEP    Time 3    Period Weeks    Status Achieved    Target Date 02/08/21      PT SHORT TERM GOAL #2   Title  Report pain decrease from   5 /10 to   2 /10.    Baseline 0/10 at rest but 7/10 with shoulder overhead reach    Time 3    Period Weeks    Status On-going    Target Date 02/08/21      PT SHORT TERM GOAL #3   Title Pt will be able to increase horizontal adduction to be symmetrical    Baseline horizontal add L 116, R 116    Period Weeks    Status Achieved    Target Date 02/08/21               PT Long Term Goals - 03/02/21 1157  PT LONG TERM GOAL #1   Title Pt will be independent with advanced HEP.    Time 6    Period Weeks    Status On-going    Target Date 04/13/21      PT LONG TERM GOAL #2   Title Pain will decrease to 1/10  or less with all  recreational pursuits and overhead serving/overhead strokes in tennis    Baseline at worst 7/10  0/10 at rest    Time 6    Period Weeks    Status On-going     Target Date 04/13/21      PT LONG TERM GOAL #3   Title R shoulder AROM flexion will improve to 0-160 degrees for improved overhead reaching    Baseline 148 R AROM    Time 8    Period Weeks    Status Revised    Target Date 04/13/21      PT LONG TERM GOAL #4   Title FOTO will improve from 53%    to  72%   indicating improved functional mobility .    Baseline eval intake 53% 03-02-21 63%    Time 6    Period Weeks    Status On-going      PT LONG TERM GOAL #5   Title Reach behind head for ADL's without difficulty    Baseline Pt with back scratch test Left handIR/R hand ER 1.5 inch.  R hand IR/L hand ER 4  inch    Time 8    Period Weeks    Status Achieved    Target Date 04/13/21      PT LONG TERM GOAL #6   Title Pt wants to be able to perform push up and burpee at boot camp without exacerbating pain    Baseline Unable to perform burpees and needs strength for push ups    Time 6    Period Weeks    Status New    Target Date 04/13/21                   Plan - 03/02/21 1136     Clinical Impression Statement Ms Longmire has improved AROM and horizontal ADDuction and back scratch test 18 inch improved to 4 inch for more mobility in R shld.  she has 0/10 pain at rest but still at times can go to 7/10 for overhead reach.  She is beginning strengthening since she has regained AROM.  FOTO improved 10 points from 53% to 63% but goal 72%.  Pt  would benefit from skilled PT to address strength deficits in order to return to tennis and exercise boot camp without exacerbating pain.  Pt needs to strengthen for injury prevention as well and would benefit form 6 week extension to maximize strength and minimize pain    Personal Factors and Comorbidities Transportation    Examination-Activity Limitations Other;Lift;Reach Overhead    Examination-Participation Restrictions Other    PT Treatment/Interventions ADLs/Self Care Home Management;Cryotherapy;Electrical Stimulation;Iontophoresis 4mg /ml  Dexamethasone;Moist Heat;Therapeutic exercise;Neuromuscular re-education;Patient/family education;Passive range of motion;Manual techniques;Dry needling;Taping;Joint Manipulations    PT Next Visit Plan ERO,   review exercise.  TPDN PRN.  lift/ chop scapular stability, hows reaching behind head/ back.    PT Home Exercise Plan [place] and Agree with Plan of Care Patient             Patient will benefit from skilled therapeutic intervention in order to improve the following deficits and impairments:  Pain, Improper  body mechanics, Postural dysfunction, Impaired UE functional use, Impaired flexibility, Decreased strength, Decreased range of motion  Visit Diagnosis: Muscle weakness (generalized)  Acute pain of right shoulder  Abnormal posture     Problem List Patient Active Problem List   Diagnosis Date Noted   Osteopenia 08/29/2020   Primary insomnia 08/29/2020   Personal history of colonic polyps 08/28/2020   Vitamin D deficiency 08/28/2020   Hormone replacement therapy (HRT) 02/13/2017   Right foot pain 02/22/2013   LATERAL EPICONDYLITIS, BILATERAL 12/26/2009   Voncille Lo, PT, [place] Certified Exercise Expert for the Aging Adult  03/02/21 12:35 PM Phone: [phone] Fax: [place] [facility] [address] [place], [place], [address] Phone: [phone]   Fax:  [phone]  Name: ELODY KLEINSASSER MRN: [id number] Date of Birth: [birth year]

## 2021-03-07 ENCOUNTER — Ambulatory Visit: Payer: 59 | Attending: Sports Medicine | Admitting: Physical Therapy

## 2021-03-07 ENCOUNTER — Other Ambulatory Visit: Payer: Self-pay

## 2021-03-07 DIAGNOSIS — R293 Abnormal posture: Secondary | ICD-10-CM | POA: Insufficient documentation

## 2021-03-07 DIAGNOSIS — M25511 Pain in right shoulder: Secondary | ICD-10-CM | POA: Insufficient documentation

## 2021-03-07 DIAGNOSIS — M6281 Muscle weakness (generalized): Secondary | ICD-10-CM | POA: Insufficient documentation

## 2021-03-07 NOTE — Therapy (Signed)
[place], [place], [address] Phone: [phone]   Fax:  [phone]  Physical Therapy Treatment  Patient Details  Name: Rachel Beasley MRN: [id number] Date of Birth: 05-23-1956 Referring Provider (PT): DrTtim Micheline Chapman DO   Encounter Date: 03/07/2021   PT End of Session - 03/07/21 1504     Visit Number 11    Number of Visits 16    Date for PT Re-Evaluation 04/13/21    Authorization Type UMR    PT Start Time 1502    PT Stop Time 1545    PT Time Calculation (min) 43 min    Activity Tolerance Patient tolerated treatment well    Behavior During Therapy [facility] for tasks assessed/performed             Past Medical History:  Diagnosis Date   Dyspareunia    Endometriosis     Past Surgical History:  Procedure Laterality Date   [place]   benign- endometrioma- laparatomy then danazol 6 months   SPINAL FUSION  2006   C5-6 rupture    There were no vitals filed for this visit.   Subjective Assessment - 03/07/21 1505     Subjective "I did get Dry needled from [place] today and she was thinking I may be dealing with frozen shoulder."    Currently in Pain? Yes    Pain Score 3     Pain Orientation Right    Pain Descriptors / Indicators Aching;Sore                [facility] PT Assessment - 03/07/21 0001       Assessment   Medical Diagnosis R shoulder pain (labral tear?)    Referring Provider (PT) DrTtim Micheline Chapman DO                [facility] Adult PT Treatment/Exercise:  Therapeutic Exercise: Supine scapular protraction with rhythmic stabilization 3 x 30  Supine sustained horizontal abduction with RTB combined with shoulder flexion  2 x 10  UBE L 5x 4 min (fwd/bwd x 2 min ea.) Lower trap wall ys with sustained pull with RTB 2 x 10  eccentric bicep curl 2 x 12 with BTB BTB with lat pulls 3 x 10 on R  Single arm shld Row into ER   in 90/90 pos with anchored resisitance RTB 2 x 10 Bench dips focus on scapular depression 2 x 12 with knees flexed 90 deg and  feet on floor  Manual Therapy: Skilled palpation and monitoring of pt during TPDN Distal clavicle mobs inferior/ posteriorly  Neuromuscular re-ed: N/A  Therapeutic Activity: N/A  Modalities: N/A  Self Care: N/A  Consider / progression for next session:          Trigger Point Dry Needling - 03/07/21 0001     Consent Given? Yes    Education Handout Provided Previously provided    Deltoid Response Twitch response elicited;Palpable increased muscle length   middle/ anterior   Biceps Response Twitch response elicited;Palpable increased muscle length                   PT Education - 03/07/21 1646     Education Details updated HEP to include lower trap bench dips.    Person(s) Educated Patient    Methods Explanation;Verbal cues    Comprehension Verbalized understanding;Verbal cues required  PT Short Term Goals - 03/02/21 1156       PT SHORT TERM GOAL #1   Title Pt will be independent with intial HEP    Baseline compliant with initial HEP    Time 3    Period Weeks    Status Achieved    Target Date 02/08/21      PT SHORT TERM GOAL #2   Title  Report pain decrease from   5 /10 to   2 /10.    Baseline 0/10 at rest but 7/10 with shoulder overhead reach    Time 3    Period Weeks    Status On-going    Target Date 02/08/21      PT SHORT TERM GOAL #3   Title Pt will be able to increase horizontal adduction to be symmetrical    Baseline horizontal add L 116, R 116    Period Weeks    Status Achieved    Target Date 02/08/21               PT Long Term Goals - 03/02/21 1157       PT LONG TERM GOAL #1   Title Pt will be independent with advanced HEP.    Time 6    Period Weeks    Status On-going    Target Date 04/13/21      PT LONG TERM GOAL #2   Title Pain will decrease to 1/10  or less with all   recreational pursuits and overhead serving/overhead strokes in tennis    Baseline at worst 7/10  0/10 at rest    Time 6    Period Weeks    Status On-going    Target Date 04/13/21      PT LONG TERM GOAL #3   Title R shoulder AROM flexion will improve to 0-160 degrees for improved overhead reaching    Baseline 148 R AROM    Time 8    Period Weeks    Status Revised    Target Date 04/13/21      PT LONG TERM GOAL #4   Title FOTO will improve from 53%    to  72%   indicating improved functional mobility .    Baseline eval intake 53% 03-02-21 63%    Time 6    Period Weeks    Status On-going      PT LONG TERM GOAL #5   Title Reach behind head for ADL's without difficulty    Baseline Pt with back scratch test Left handIR/R hand ER 1.5 inch.  R hand IR/L hand ER 4  inch    Time 8    Period Weeks    Status Achieved    Target Date 04/13/21      PT LONG TERM GOAL #6   Title Pt wants to be able to perform push up and burpee at boot camp without exacerbating pain    Baseline Unable to perform burpees and needs strength for push ups    Time 6    Period Weeks    Status New    Target Date 04/13/21                   Plan - 03/07/21 1545     Clinical Impression Statement pt arrives reporting continues soreness pointing to her [facility] joint onthe R shoulder and notes some bicep soreness when reaching behind her back. continued TPDN focusing only on the bicep/ deltoid followed with distal clavicle mobs  grade III. she reported reduced tension / soreness when reaching behind the back following treatment. continued working on scapular stability espeically with overhead activities which she did well with.    PT Treatment/Interventions ADLs/Self Care Home Management;Cryotherapy;Electrical Stimulation;Iontophoresis 4mg /ml Dexamethasone;Moist Heat;Therapeutic exercise;Neuromuscular re-education;Patient/family education;Passive range of motion;Manual techniques;Dry needling;Taping;Joint Manipulations     PT Next Visit Plan review exercise.  TPDN PRN.  lift/ chop scapular stability, hows reaching behind head/ back.    PT Home Exercise Plan [id number]             Patient will benefit from skilled therapeutic intervention in order to improve the following deficits and impairments:  Pain, Improper body mechanics, Postural dysfunction, Impaired UE functional use, Impaired flexibility, Decreased strength, Decreased range of motion  Visit Diagnosis: Muscle weakness (generalized)  Acute pain of right shoulder  Abnormal posture     Problem List Patient Active Problem List   Diagnosis Date Noted   Osteopenia 08/29/2020   Primary insomnia 08/29/2020   Personal history of colonic polyps 08/28/2020   Vitamin D deficiency 08/28/2020   Hormone replacement therapy (HRT) 02/13/2017   Right foot pain 02/22/2013   LATERAL EPICONDYLITIS, BILATERAL 12/26/2009   [place]arr [place] PT, DPT, LAT, ATC  03/07/21  4:55 PM      [facility] [facility] [address] [place], [place], [address] Phone: [phone]   Fax:  [phone]  Name: Rachel Beasley MRN: [id number] Date of Birth: 09/06/1955

## 2021-03-27 ENCOUNTER — Other Ambulatory Visit: Payer: Self-pay

## 2021-03-27 ENCOUNTER — Encounter: Payer: Self-pay | Admitting: Physical Therapy

## 2021-03-27 ENCOUNTER — Ambulatory Visit: Payer: 59 | Admitting: Physical Therapy

## 2021-03-27 DIAGNOSIS — M6281 Muscle weakness (generalized): Secondary | ICD-10-CM

## 2021-03-27 DIAGNOSIS — M25511 Pain in right shoulder: Secondary | ICD-10-CM | POA: Diagnosis not present

## 2021-03-27 DIAGNOSIS — R293 Abnormal posture: Secondary | ICD-10-CM

## 2021-03-27 NOTE — Therapy (Signed)
[place], [place], [address] Phone: [phone]   Fax:  [phone]  Physical Therapy Treatment  Patient Details  Name: Rachel Beasley MRN: [id number] Date of Birth: Aug 23, 1955 Referring Provider (PT): DrTtim Micheline Chapman DO   Encounter Date: 03/27/2021   PT End of Session - 03/27/21 1330     Visit Number 12    Number of Visits 16    Date for PT Re-Evaluation 04/13/21    Authorization Type UMR    PT Start Time 1330    PT Stop Time 1416    PT Time Calculation (min) 46 min    Activity Tolerance Patient tolerated treatment well    Behavior During Therapy [facility] for tasks assessed/performed             Past Medical History:  Diagnosis Date   Dyspareunia    Endometriosis     Past Surgical History:  Procedure Laterality Date   [place]   benign- endometrioma- laparatomy then danazol 6 months   SPINAL FUSION  2006   C5-6 rupture    There were no vitals filed for this visit.   Subjective Assessment - 03/27/21 1332     Subjective We were in [place] last week and I tripped over a partition and landed with outstretched arm partial weight and I also carried a heavy bag. I feel like               Deep neck strengthening with green t band.  Bil shld flex, straight elbows with self cues with mirror for straight neck 2 x 10 green t band   Supine deep neck flexion with 1 inch flex and hold   17 sec to 25 sec   Normal 35 -40 second hold   Chin tucks x 10  Weight Bench Chin tuck/thoracic extension/ W with weights [place] with 5 lb weights 2 x 10  Take a Kettle ball and hold in position overhead in shld 90/90 and going from shld flex to abd for 2 minutes  1/2 kneeling  on foam cushion and catching baseball or weighted ball.  10 minutes of throwing with weighted red ball and then green ball    Tennis Racquet practice with Eccentric  control Racquet R hand Green t band Left foot and eccentrically control                           PT Education - 03/27/21 1451     Education Details educated on eccentric control of R shld and preparation for return to tennis/ sport/ exercise    Person(s) Educated Patient    Methods Explanation;Demonstration;Tactile cues;Verbal cues;Handout    Comprehension Verbalized understanding;Returned demonstration              PT Short Term Goals - 03/02/21 1156       PT SHORT TERM GOAL #1   Title Pt will be independent with intial HEP    Baseline compliant with initial HEP    Time 3    Period Weeks    Status Achieved    Target Date 02/08/21      PT SHORT TERM GOAL #2   Title  Report pain decrease from   5 /10 to   2 /10.    Baseline 0/10 at rest but 7/10 with shoulder overhead reach    Time 3  Period Weeks    Status On-going    Target Date 02/08/21      PT SHORT TERM GOAL #3   Title Pt will be able to increase horizontal adduction to be symmetrical    Baseline horizontal add L 116, R 116    Period Weeks    Status Achieved    Target Date 02/08/21               PT Long Term Goals - 03/02/21 1157       PT LONG TERM GOAL #1   Title Pt will be independent with advanced HEP.    Time 6    Period Weeks    Status On-going    Target Date 04/13/21      PT LONG TERM GOAL #2   Title Pain will decrease to 1/10  or less with all  recreational pursuits and overhead serving/overhead strokes in tennis    Baseline at worst 7/10  0/10 at rest    Time 6    Period Weeks    Status On-going    Target Date 04/13/21      PT LONG TERM GOAL #3   Title R shoulder AROM flexion will improve to 0-160 degrees for improved overhead reaching    Baseline 148 R AROM    Time 8    Period Weeks    Status Revised    Target Date 04/13/21      PT LONG TERM GOAL #4   Title FOTO will improve from 53%    to  72%   indicating improved functional mobility .    Baseline  eval intake 53% 03-02-21 63%    Time 6    Period Weeks    Status On-going      PT LONG TERM GOAL #5   Title Reach behind head for ADL's without difficulty    Baseline Pt with back scratch test Left handIR/R hand ER 1.5 inch.  R hand IR/L hand ER 4  inch    Time 8    Period Weeks    Status Achieved    Target Date 04/13/21      PT LONG TERM GOAL #6   Title Pt wants to be able to perform push up and burpee at boot camp without exacerbating pain    Baseline Unable to perform burpees and needs strength for push ups    Time 6    Period Weeks    Status New    Target Date 04/13/21                   Plan - 03/27/21 1447     Clinical Impression Statement Pt arrives without any pain in shoulder.  Pt did report falling on outstretched hand while in [place] and had to carry heavy luggage but she is better today and reports after doing boot camp yesterday that she is doing much better over all.  Pt is concerned about returning to tennis and emphasis today was on eccentric control using tennis racquet and weights.  Pt will return for one additional visit and try to play tennis this weekend for re evaluation/ DC next week    Personal Factors and Comorbidities Transportation    Examination-Activity Limitations Other;Lift;Reach Overhead    Examination-Participation Restrictions Other    PT Frequency 2x / week    PT Duration 8 weeks    PT Treatment/Interventions ADLs/Self Care Home Management;Cryotherapy;Electrical Stimulation;Iontophoresis 4mg /ml Dexamethasone;Moist Heat;Therapeutic exercise;Neuromuscular re-education;Patient/family education;Passive range of motion;Manual techniques;Dry  needling;Taping;Joint Manipulations    PT Next Visit Plan review exercise.  TPDN PRN.  lift/ chop scapular stability, hows reaching behind head/ back. check for DC next visit  [place] and Agree with Plan of Care Patient             Patient will benefit from  skilled therapeutic intervention in order to improve the following deficits and impairments:  Pain, Improper body mechanics, Postural dysfunction, Impaired UE functional use, Impaired flexibility, Decreased strength, Decreased range of motion  Visit Diagnosis: Muscle weakness (generalized)  Acute pain of right shoulder  Abnormal posture     Problem List Patient Active Problem List   Diagnosis Date Noted   Osteopenia 08/29/2020   Primary insomnia 08/29/2020   Personal history of colonic polyps 08/28/2020   Vitamin D deficiency 08/28/2020   Hormone replacement therapy (HRT) 02/13/2017   Right foot pain 02/22/2013   LATERAL EPICONDYLITIS, BILATERAL 12/26/2009   Voncille Lo, PT, [place] Certified Exercise Expert for the Aging Adult  03/27/21 2:54 PM Phone: [phone] Fax: [place] [facility] [address] [place], [place], [address] Phone: [phone]   Fax:  [phone]  Name: Rachel Beasley MRN: [id number] Date of Birth: [birth year]

## 2021-03-27 NOTE — Patient Instructions (Signed)
Deep Neck Flexor Test- Strengthening   Lie on back with hand behind head.  Lift neck 1 in only( barely lift up)  Try to time for 40 seconds. And work up to it.  You did it for 17 seconds in  clinic.  Mirror neck strengthening with green t band.  Correct neck (chin tuck and straighten neck)  in mirror and then use therabnad to flex arms toward ceiling. Step on Green t band with both feet.  Keep arms Meda Coffee straight  Weight Bench Chin tuck/thoracic extension/ W with weights On video    Make sure you have  sternum off of the weight bench.  Then chin tuck,  extend upper back then utilize weights in W position  Take a Kettle ball and hold in position overhead  1/2 kneeling and catching baseball or weighted ball.  Kneel on pillow with R knee and Left knee up  . Have Richardson Landry throw ball above right hand.  [place]  you follow through with eccentric control toward your left hip  Tennis Racquet practice with Eccentric control Racquet R hand Green t band Left foot and eccentrically control  Voncille Lo, PT, [place] Certified Exercise Expert for the Aging Adult  03/27/21 2:23 PM Phone: [phone] Fax: [phone]

## 2021-04-02 ENCOUNTER — Other Ambulatory Visit: Payer: Self-pay

## 2021-04-02 ENCOUNTER — Encounter: Payer: Self-pay | Admitting: Physical Therapy

## 2021-04-02 ENCOUNTER — Ambulatory Visit: Payer: 59 | Admitting: Physical Therapy

## 2021-04-02 DIAGNOSIS — R293 Abnormal posture: Secondary | ICD-10-CM | POA: Diagnosis not present

## 2021-04-02 DIAGNOSIS — M6281 Muscle weakness (generalized): Secondary | ICD-10-CM | POA: Diagnosis not present

## 2021-04-02 DIAGNOSIS — M25511 Pain in right shoulder: Secondary | ICD-10-CM | POA: Diagnosis not present

## 2021-04-02 NOTE — Patient Instructions (Signed)
        Voncille Lo, PT, [place] Certified Exercise Expert for the Aging Adult  04/02/21 2:45 PM Phone: [phone] Fax: [phone]

## 2021-04-02 NOTE — Therapy (Signed)
[place], [place], [address] Phone: [phone]   Fax:  [phone]  Physical Therapy Treatment/Discharge Note  Patient Details  Name: ZOLLIE CLEMENCE MRN: [id number] Date of Birth: 1955-07-14 Referring Provider (PT): DrTtim Micheline Chapman DO   Encounter Date: 04/02/2021   PT End of Session - 04/02/21 1741     Visit Number 13    Number of Visits 16    Date for PT Re-Evaluation 04/13/21    Authorization Type UMR    PT Start Time 1416    PT Stop Time 1500    PT Time Calculation (min) 44 min    Activity Tolerance Patient tolerated treatment well    Behavior During Therapy [facility] for tasks assessed/performed             Past Medical History:  Diagnosis Date   Dyspareunia    Endometriosis     Past Surgical History:  Procedure Laterality Date   [place]   benign- endometrioma- laparatomy then danazol 6 months   SPINAL FUSION  2006   C5-6 rupture    There were no vitals filed for this visit.   Subjective Assessment - 04/02/21 1737     Subjective We were in [place] last week  I have been working out with fitness trainer Butch Penny copeland and I hit balls in tennis for 30 minutes.  She will return to tennis now and continue HEP for strengthening.    Limitations Lifting    How long can you sit comfortably? unlimited    How long can you stand comfortably? unlimited    How long can you walk comfortably? unlimited    Diagnostic tests Ultrasound with fluid on biceps tendon    Patient Stated Goals Be able to get back to tennis.  heal shoulder and get stronger    Currently in Pain? Yes    Pain Score 1    When lifting in scaption or pulling on shoelaces   Pain Location Shoulder    Pain Orientation Right    Pain Descriptors / Indicators Tender   at end range   Pain Onset More than a month ago    Pain Frequency Occasional                [facility]  PT Assessment - 04/02/21 0001       Assessment   Medical Diagnosis R shoulder pain (labral tear?)    Referring Provider (PT) DrTtim Draper DO      AROM   Right Shoulder Flexion 155 Degrees    Right Shoulder ABduction 150 Degrees    Right Shoulder Internal Rotation 54 Degrees    Right Shoulder External Rotation 95 Degrees    Right Shoulder Horizontal  ADduction 116 Degrees      Strength   Right Shoulder Flexion 5/5    Right Shoulder ABduction 4+/5    Right Shoulder Internal Rotation 5/5    Right Shoulder External Rotation 5/5    Left Shoulder Flexion 5/5    Left Shoulder ABduction 5/5    Left Shoulder Internal Rotation 5/5    Left Shoulder External Rotation 5/5      other   Comment Pt with Rhand ER/Lhand 1 inch  Left handER/R hand IR 4-6 inches inches depending on stretch           Access Code: [email or web address]: [email or web address]: 10/31/2022Prepared by: Lesly Rubenstein Notes  A)Eccentric Glenohumeral  Internal Rotation with t band. as shown in clinic in right sidelying. in clinic   Exercises  Pt demo a few reps and discussing proper use and demo of exercises.    Deep neck flexion training necessary for neck pain/ alignment Standing Isometric Shoulder Extension at Table - 1 sets - 5 reps - 45 hold   Starred exercise Doorway Pec Stretch at 90 Degrees Abduction - 3 reps - 30 sec hold  Sidelying Shoulder ER with Towel and Dumbbell - 3 sets - 10 reps - 3 lb hold  Supine Cross Body Shoulder Stretch - - 1 sets - 3 reps - 30-60 sec hold  Standing Shoulder Scaption - 1 x daily - 7 x weekly - 2 sets - 12 reps  Standing Sleeper Stretch at [employer] (Mirrored) - 1 x daily - 7 x weekly - 2 sets - 2 reps - 30 sec hold  Standing Shoulder Single Arm PNF D2 Flexion with Anchored Resistance -  Standing Single Arm Shoulder PNF D1 Extension with Anchored Resistance -  Standing Anti-Rotation Press with Anchored Resistance - 1 x daily - 7 x weekly - 2 sets - 15 reps  Supine  Single Arm Shoulder Protraction - 1 x daily - 7 x weekly - 2 sets - 10 reps  Prone on Swiss Ball Shoulder Row to ER to Overhead Reach w/ weight - Standing Single Arm Shoulder External Rotation in Abduction with Anchored Resistance - Full Plank with Shoulder Taps -  Working with trainer Push Up on Step - Working with trainer Single Arm Scaption with Resistance - 0 reps with blue T band demo  Starred exercise  Reviewed HEP and DC some on sheet given to pt.     Self care   Timeline for recovery of tendonitis. Progressive overload for benefit for overhead playing in tennis. When to return to MD                         PT Education - 04/02/21 1740     Education Details reinforced eccentric control of R shld and reviewed entire HEP and wrote out specific uses for exercises. Pt will routinedly perform the starred exercises given.    Person(s) Educated Patient    Methods Explanation;Demonstration;Handout;Tactile cues;Verbal cues    Comprehension Verbalized understanding;Returned demonstration              PT Short Term Goals - 03/02/21 1156       PT SHORT TERM GOAL #1   Title Pt will be independent with intial HEP    Baseline compliant with initial HEP    Time 3    Period Weeks    Status Achieved    Target Date 02/08/21      PT SHORT TERM GOAL #2   Title  Report pain decrease from   5 /10 to   2 /10.    Baseline 0/10 at rest but 7/10 with shoulder overhead reach    Time 3    Period Weeks    Status On-going    Target Date 02/08/21      PT SHORT TERM GOAL #3   Title Pt will be able to increase horizontal adduction to be symmetrical    Baseline horizontal add L 116, R 116    Period Weeks    Status Achieved    Target Date 02/08/21               PT Long Term Goals - 04/02/21  [place] #1   Title Pt will be independent with advanced HEP.    Time 6    Period Weeks    Status Achieved      PT LONG TERM GOAL #2   Title Pain  will decrease to 1/10  or less with all  recreational pursuits and overhead serving/overhead strokes in tennis    Baseline 1/10 today with  arm outstretched    Time 6    Period Weeks    Status Achieved    Target Date 04/13/21      PT LONG TERM GOAL #3   Title R shoulder AROM flexion will improve to 0-160 degrees for improved overhead reaching    Baseline 155  R AROM    Time 8    Period Weeks    Status Partially Met    Target Date 04/13/21      PT LONG TERM GOAL #4   Title FOTO will improve from 53%    to  72%   indicating improved functional mobility .    Baseline eval intake 53% 03-02-21 63%  67% on DC 04-02-21    Time 6    Period Weeks    Status Partially Met      PT LONG TERM GOAL #5   Title Reach behind head for ADL's without difficulty    Baseline Pt with back scratch test Left handIR/R hand ER 1.0 inch.  R hand IR/L hand ER 4  inch    Time 8    Period Weeks    Status Achieved      PT LONG TERM GOAL #6   Title Pt wants to be able to perform push up and burpee at boot camp without exacerbating pain    Baseline Pt now able to participate in boot camp plank and push up activities    Time 6    Period Weeks    Status Achieved    Target Date 04/13/21                   Plan - 04/02/21 1433     Clinical Impression Statement Ms Erxleben has improved her AROM and her pain to 0/10 to 1/10 depending on full AROM of scaption but she is presently working with a trainer with advanced HEP technique planks/pushups.  She does report she sometimes has pain while pulling up the tongue of her shoe and PT gave her some pain management strategies to continue progressively strengthening.  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  Pt has improved her AROM and MMT and has met or partially met all goals nearly completing all goals.  Pt sholder return to her MD for an MRI if her pain increases 4/10 or  more or she is unable to perform her daily task/ leisure activities. but Mrs Grace Bushy appears ready to continue strengthening and has seen benefits of posterior chain strength training for posture and propler alignment of R shld joint.  Mrs Blasingame is a joy for whom to serve and works diligently to prioritize her heallth    Personal Factors and Comorbidities Transportation    Examination-Activity Limitations Other;Lift;Reach Overhead    Examination-Participation Restrictions Other    PT Treatment/Interventions ADLs/Self Care Home Management;Cryotherapy;Electrical Stimulation;Iontophoresis 76m/ml Dexamethasone;Moist Heat;Therapeutic exercise;Neuromuscular re-education;Patient/family education;Passive range of motion;Manual techniques;Dry needling;Taping;Joint Manipulations    PT Next Visit  [place] and Agree with Plan of Care Patient             Patient will benefit from skilled therapeutic intervention in order to improve the following deficits and impairments:  Pain, Improper body mechanics, Postural dysfunction, Impaired UE functional use, Impaired flexibility, Decreased strength, Decreased range of motion  Visit Diagnosis: Muscle weakness (generalized)  Acute pain of right shoulder  Abnormal posture     Problem List Patient Active Problem List   Diagnosis Date Noted   Osteopenia 08/29/2020   Primary insomnia 08/29/2020   Personal history of colonic polyps 08/28/2020   Vitamin D deficiency 08/28/2020   Hormone replacement therapy (HRT) 02/13/2017   Right foot pain 02/22/2013   LATERAL EPICONDYLITIS, BILATERAL 12/26/2009   Voncille Lo, PT, [place] Certified Exercise Expert for the Aging Adult  04/02/21 6:04 PM Phone: [phone] Fax: [facility] [address] [place], [place], [address] Phone: [phone]   Fax:  [phone]  Name: DAMONICA CHOPRA MRN:  [id number] Date of Birth: 06/09/1955  PHYSICAL THERAPY DISCHARGE SUMMARY  Visits from Start of Care: 13  Current functional level related to goals / functional outcomes: AS ABOVE   Remaining deficits: Minimal 1/10   Education / Equipment: HEP and progressive strengthening   Patient agrees to discharge. Patient goals were met. Patient is being discharged due to meeting the stated rehab goals. And being pleased with current functional level  Voncille Lo, PT, [facility] Certified Exercise Expert for the Aging Adult  04/02/21 6:04 PM Phone: [phone] Fax: [phone]

## 2021-04-03 ENCOUNTER — Encounter: Payer: 59 | Admitting: Physical Therapy

## 2021-04-04 ENCOUNTER — Other Ambulatory Visit (HOSPITAL_BASED_OUTPATIENT_CLINIC_OR_DEPARTMENT_OTHER): Payer: Self-pay | Admitting: Obstetrics & Gynecology

## 2021-04-05 ENCOUNTER — Other Ambulatory Visit (HOSPITAL_COMMUNITY): Payer: Self-pay

## 2021-04-05 MED ORDER — ESTRADIOL 0.025 MG/24HR TD PTTW
1.0000 | MEDICATED_PATCH | TRANSDERMAL | 2 refills | Status: DC
  Filled 2021-04-05: qty 8, 28d supply, fill #0
  Filled 2021-05-19: qty 8, 28d supply, fill #1
  Filled 2021-08-01: qty 8, 28d supply, fill #2

## 2021-04-06 ENCOUNTER — Other Ambulatory Visit (HOSPITAL_COMMUNITY): Payer: Self-pay

## 2021-05-01 ENCOUNTER — Other Ambulatory Visit: Payer: Self-pay | Admitting: Obstetrics & Gynecology

## 2021-05-01 ENCOUNTER — Encounter: Payer: Self-pay | Admitting: Sports Medicine

## 2021-05-01 DIAGNOSIS — Z1231 Encounter for screening mammogram for malignant neoplasm of breast: Secondary | ICD-10-CM

## 2021-05-02 ENCOUNTER — Telehealth: Payer: Self-pay | Admitting: Sports Medicine

## 2021-05-02 ENCOUNTER — Other Ambulatory Visit: Payer: Self-pay

## 2021-05-02 DIAGNOSIS — M25511 Pain in right shoulder: Secondary | ICD-10-CM

## 2021-05-02 NOTE — Telephone Encounter (Signed)
  I received a MyChart message from [facility] regarding her right shoulder.  She has had extensive physical therapy and has noticed some improvement but is not completely pain-free.  She is still experiencing intermittent pain, especially with activity.  Therefore, I recommend that we proceed with an MRI to evaluate further.  Phone follow-up with those results when available and we will delineate further treatment based on those findings.

## 2021-05-04 ENCOUNTER — Other Ambulatory Visit: Payer: Self-pay

## 2021-05-04 DIAGNOSIS — M542 Cervicalgia: Secondary | ICD-10-CM

## 2021-05-07 ENCOUNTER — Other Ambulatory Visit (HOSPITAL_BASED_OUTPATIENT_CLINIC_OR_DEPARTMENT_OTHER): Payer: Self-pay | Admitting: Obstetrics & Gynecology

## 2021-05-07 DIAGNOSIS — Z79890 Hormone replacement therapy (postmenopausal): Secondary | ICD-10-CM

## 2021-05-08 ENCOUNTER — Ambulatory Visit: Payer: 59

## 2021-05-11 ENCOUNTER — Other Ambulatory Visit (HOSPITAL_COMMUNITY): Payer: Self-pay

## 2021-05-14 ENCOUNTER — Other Ambulatory Visit (HOSPITAL_COMMUNITY): Payer: Self-pay

## 2021-05-14 ENCOUNTER — Other Ambulatory Visit (HOSPITAL_BASED_OUTPATIENT_CLINIC_OR_DEPARTMENT_OTHER): Payer: Self-pay | Admitting: Obstetrics & Gynecology

## 2021-05-14 DIAGNOSIS — Z79890 Hormone replacement therapy (postmenopausal): Secondary | ICD-10-CM

## 2021-05-15 ENCOUNTER — Other Ambulatory Visit (HOSPITAL_COMMUNITY): Payer: Self-pay

## 2021-05-15 MED ORDER — PROGESTERONE MICRONIZED 100 MG PO CAPS
100.0000 mg | ORAL_CAPSULE | Freq: Two times a day (BID) | ORAL | 2 refills | Status: DC
  Filled 2021-05-15: qty 60, 30d supply, fill #0
  Filled 2021-07-16: qty 60, 30d supply, fill #1

## 2021-05-18 ENCOUNTER — Other Ambulatory Visit (HOSPITAL_COMMUNITY): Payer: Self-pay

## 2021-05-19 ENCOUNTER — Ambulatory Visit
Admission: RE | Admit: 2021-05-19 | Discharge: 2021-05-19 | Disposition: A | Discharge status: Home / Self Care | Payer: 59 | Source: Ambulatory Visit | Attending: Sports Medicine | Admitting: Sports Medicine

## 2021-05-19 ENCOUNTER — Other Ambulatory Visit: Payer: Self-pay

## 2021-05-19 DIAGNOSIS — M4802 Spinal stenosis, cervical region: Secondary | ICD-10-CM | POA: Diagnosis not present

## 2021-05-19 DIAGNOSIS — M25511 Pain in right shoulder: Secondary | ICD-10-CM

## 2021-05-19 DIAGNOSIS — M542 Cervicalgia: Secondary | ICD-10-CM

## 2021-05-19 DIAGNOSIS — M50223 Other cervical disc displacement at C6-C7 level: Secondary | ICD-10-CM | POA: Diagnosis not present

## 2021-05-19 DIAGNOSIS — S46011A Strain of muscle(s) and tendon(s) of the rotator cuff of right shoulder, initial encounter: Secondary | ICD-10-CM | POA: Diagnosis not present

## 2021-05-21 ENCOUNTER — Other Ambulatory Visit (HOSPITAL_COMMUNITY): Payer: Self-pay

## 2021-05-21 ENCOUNTER — Telehealth: Payer: Self-pay | Admitting: Sports Medicine

## 2021-05-21 NOTE — Telephone Encounter (Signed)
I spoke with Rachel Beasley on the phone today after reviewing MRIs of both the right shoulder and her cervical spine.  MRI of the right shoulder shows severe tendinosis of the intra-articular portion of the long head of the biceps tendon.  Rotator cuff is intact.  Cervical spine MRI shows her previous ACDF of C5-C6 without any complication.  She also has severe right and moderate left foraminal stenosis at C6-C7.  Rachel Beasley tells me that she was aware that she had disease at this level from a previous MRI.  In fact, she had previously had significant weakness in her right triceps many years ago which resolved with conservative treatment.  She does not feel like the overall function and strength of the right arm is affected.  Her main symptoms continue to be anterior shoulder pain.  Both MRI and ultrasound have shown fluid and severe tendinopathy of the proximal biceps tendon.  She has had symptoms for several months and I recommend that she discuss possible tenotomy or tenodesis with one of the orthopedists.  I have given her the names of Dr. Griffin Basil, Dr. Tamera Punt, and Dr. Onnie Graham.  She will think over her options and will let us know which orthopedist she would like to see.  In the meantime, I think she is okay to continue with activity using pain as her guide.

## 2021-05-23 ENCOUNTER — Other Ambulatory Visit (HOSPITAL_COMMUNITY): Payer: Self-pay

## 2021-05-23 MED ORDER — CARESTART COVID-19 HOME TEST VI KIT
PACK | 0 refills | Status: DC
Start: 2021-05-23 — End: 2021-06-06
  Filled 2021-05-23: qty 4, 4d supply, fill #0

## 2021-05-31 DIAGNOSIS — M25511 Pain in right shoulder: Secondary | ICD-10-CM | POA: Diagnosis not present

## 2021-06-06 ENCOUNTER — Other Ambulatory Visit (HOSPITAL_COMMUNITY): Payer: Self-pay

## 2021-06-06 MED ORDER — CARESTART COVID-19 HOME TEST VI KIT
PACK | 0 refills | Status: DC
  Filled 2021-06-06: qty 4, 4d supply, fill #0

## 2021-06-11 ENCOUNTER — Ambulatory Visit: Payer: 59

## 2021-06-14 ENCOUNTER — Other Ambulatory Visit (HOSPITAL_COMMUNITY): Payer: Self-pay

## 2021-06-20 ENCOUNTER — Other Ambulatory Visit: Payer: Self-pay | Admitting: Internal Medicine

## 2021-06-20 DIAGNOSIS — Z1231 Encounter for screening mammogram for malignant neoplasm of breast: Secondary | ICD-10-CM

## 2021-06-25 ENCOUNTER — Encounter (HOSPITAL_BASED_OUTPATIENT_CLINIC_OR_DEPARTMENT_OTHER): Payer: Self-pay | Admitting: Orthopaedic Surgery

## 2021-06-27 NOTE — Progress Notes (Signed)
° ° ° ° ° °  Patient Instructions  The night before surgery:  No food after midnight. ONLY clear liquids after midnight  The day of surgery (if you do NOT have diabetes):  Drink ONE (1) Pre-Surgery Clear Ensure as directed.   This drink was given to you during your hospital  pre-op appointment visit. The pre-op nurse will instruct you on the time to drink the  Pre-Surgery Ensure depending on your surgery time. Finish the drink at the designated time by the pre-op nurse.  Nothing else to drink after completing the  Pre-Surgery Clear Ensure.  The day of surgery (if you have diabetes): Drink ONE (1) Gatorade 2 (G2) as directed. This drink was given to you during your hospital  pre-op appointment visit.  The pre-op nurse will instruct you on the time to drink the   Gatorade 2 (G2) depending on your surgery time. Color of the Gatorade may vary. Red is not allowed. Nothing else to drink after completing the  Gatorade 2 (G2).         If you have questions, please contact your surgeons office.   Surgical soap and BPO given to patient with instructions for use.  Patient verbalized understanding of instructions.

## 2021-07-02 DIAGNOSIS — Z1231 Encounter for screening mammogram for malignant neoplasm of breast: Secondary | ICD-10-CM

## 2021-07-04 ENCOUNTER — Other Ambulatory Visit (HOSPITAL_COMMUNITY): Payer: Self-pay

## 2021-07-04 ENCOUNTER — Encounter (HOSPITAL_BASED_OUTPATIENT_CLINIC_OR_DEPARTMENT_OTHER): Payer: Self-pay | Admitting: Orthopaedic Surgery

## 2021-07-04 ENCOUNTER — Other Ambulatory Visit: Payer: Self-pay

## 2021-07-04 ENCOUNTER — Ambulatory Visit (HOSPITAL_BASED_OUTPATIENT_CLINIC_OR_DEPARTMENT_OTHER): Payer: 59 | Admitting: Certified Registered"

## 2021-07-04 ENCOUNTER — Encounter (HOSPITAL_BASED_OUTPATIENT_CLINIC_OR_DEPARTMENT_OTHER)
Admission: RE | Disposition: A | Discharge status: Home / Self Care | Payer: Self-pay | Source: Ambulatory Visit | Attending: Orthopaedic Surgery

## 2021-07-04 ENCOUNTER — Ambulatory Visit (HOSPITAL_BASED_OUTPATIENT_CLINIC_OR_DEPARTMENT_OTHER)
Admission: RE | Admit: 2021-07-04 | Discharge: 2021-07-04 | Disposition: A | Discharge status: Home / Self Care | Payer: 59 | Source: Ambulatory Visit | Attending: Orthopaedic Surgery | Admitting: Orthopaedic Surgery

## 2021-07-04 DIAGNOSIS — M7521 Bicipital tendinitis, right shoulder: Secondary | ICD-10-CM | POA: Insufficient documentation

## 2021-07-04 DIAGNOSIS — M75101 Unspecified rotator cuff tear or rupture of right shoulder, not specified as traumatic: Secondary | ICD-10-CM | POA: Diagnosis not present

## 2021-07-04 DIAGNOSIS — M7541 Impingement syndrome of right shoulder: Secondary | ICD-10-CM | POA: Insufficient documentation

## 2021-07-04 DIAGNOSIS — M75111 Incomplete rotator cuff tear or rupture of right shoulder, not specified as traumatic: Secondary | ICD-10-CM | POA: Insufficient documentation

## 2021-07-04 DIAGNOSIS — M24111 Other articular cartilage disorders, right shoulder: Secondary | ICD-10-CM | POA: Diagnosis not present

## 2021-07-04 DIAGNOSIS — G8918 Other acute postprocedural pain: Secondary | ICD-10-CM | POA: Diagnosis not present

## 2021-07-04 HISTORY — PX: BICEPT TENODESIS: SHX5116

## 2021-07-04 HISTORY — PX: SHOULDER ARTHROSCOPY: SHX128

## 2021-07-04 HISTORY — PX: SHOULDER ACROMIOPLASTY: SHX6093

## 2021-07-04 SURGERY — ARTHROSCOPY, SHOULDER
Anesthesia: General | Site: Shoulder | Laterality: Right

## 2021-07-04 MED ORDER — PHENYLEPHRINE HCL (PRESSORS) 10 MG/ML IV SOLN
INTRAVENOUS | Status: DC | PRN
  Administered 2021-07-04: 120 ug via INTRAVENOUS
  Administered 2021-07-04 (×2): 80 ug via INTRAVENOUS
  Administered 2021-07-04: 120 ug via INTRAVENOUS

## 2021-07-04 MED ORDER — SUGAMMADEX SODIUM 200 MG/2ML IV SOLN
INTRAVENOUS | Status: DC | PRN
  Administered 2021-07-04: 200 mg via INTRAVENOUS

## 2021-07-04 MED ORDER — ONDANSETRON HCL 4 MG/2ML IJ SOLN
INTRAMUSCULAR | Status: AC
  Filled 2021-07-04: qty 2

## 2021-07-04 MED ORDER — CEFAZOLIN SODIUM-DEXTROSE 2-4 GM/100ML-% IV SOLN
INTRAVENOUS | Status: AC
  Filled 2021-07-04: qty 100

## 2021-07-04 MED ORDER — PHENYLEPHRINE HCL-NACL 20-0.9 MG/250ML-% IV SOLN
INTRAVENOUS | Status: DC | PRN
  Administered 2021-07-04: 25 ug/min via INTRAVENOUS

## 2021-07-04 MED ORDER — MIDAZOLAM HCL 2 MG/2ML IJ SOLN
INTRAMUSCULAR | Status: AC
  Filled 2021-07-04: qty 2

## 2021-07-04 MED ORDER — ONDANSETRON HCL 4 MG PO TABS
4.0000 mg | ORAL_TABLET | Freq: Three times a day (TID) | ORAL | 0 refills | Status: AC | PRN
  Filled 2021-07-04: qty 10, 4d supply, fill #0

## 2021-07-04 MED ORDER — HYDROMORPHONE HCL 1 MG/ML IJ SOLN: 0.2500 mg | INTRAMUSCULAR | Status: DC | PRN

## 2021-07-04 MED ORDER — ROCURONIUM BROMIDE 10 MG/ML (PF) SYRINGE
PREFILLED_SYRINGE | INTRAVENOUS | Status: AC
  Filled 2021-07-04: qty 10

## 2021-07-04 MED ORDER — SODIUM CHLORIDE 0.9 % IR SOLN
Status: DC | PRN
  Administered 2021-07-04: 6000 mL

## 2021-07-04 MED ORDER — OXYCODONE HCL 5 MG/5ML PO SOLN: 5.0000 mg | Freq: Once | ORAL | Status: DC | PRN

## 2021-07-04 MED ORDER — PHENYLEPHRINE 40 MCG/ML (10ML) SYRINGE FOR IV PUSH (FOR BLOOD PRESSURE SUPPORT)
PREFILLED_SYRINGE | INTRAVENOUS | Status: AC
  Filled 2021-07-04: qty 20

## 2021-07-04 MED ORDER — CELECOXIB 200 MG PO CAPS
200.0000 mg | ORAL_CAPSULE | Freq: Two times a day (BID) | ORAL | 0 refills | Status: DC
  Filled 2021-07-04: qty 60, 30d supply, fill #0

## 2021-07-04 MED ORDER — FENTANYL CITRATE (PF) 100 MCG/2ML IJ SOLN
100.0000 ug | Freq: Once | INTRAMUSCULAR | Status: AC
  Administered 2021-07-04: 100 ug via INTRAVENOUS

## 2021-07-04 MED ORDER — LIDOCAINE 2% (20 MG/ML) 5 ML SYRINGE
INTRAMUSCULAR | Status: DC | PRN
  Administered 2021-07-04: 20 mg via INTRAVENOUS

## 2021-07-04 MED ORDER — BUPIVACAINE-EPINEPHRINE (PF) 0.5% -1:200000 IJ SOLN
INTRAMUSCULAR | Status: DC | PRN
  Administered 2021-07-04: 15 mL via PERINEURAL

## 2021-07-04 MED ORDER — BUPIVACAINE LIPOSOME 1.3 % IJ SUSP
INTRAMUSCULAR | Status: DC | PRN
  Administered 2021-07-04: 10 mL via PERINEURAL

## 2021-07-04 MED ORDER — DEXAMETHASONE SODIUM PHOSPHATE 10 MG/ML IJ SOLN
INTRAMUSCULAR | Status: AC
  Filled 2021-07-04: qty 1

## 2021-07-04 MED ORDER — PROPOFOL 10 MG/ML IV BOLUS
INTRAVENOUS | Status: AC
  Filled 2021-07-04: qty 20

## 2021-07-04 MED ORDER — ACETAMINOPHEN 500 MG PO TABS
ORAL_TABLET | ORAL | Status: AC
  Filled 2021-07-04: qty 2

## 2021-07-04 MED ORDER — MIDAZOLAM HCL 2 MG/2ML IJ SOLN: 0.5000 mg | Freq: Once | INTRAMUSCULAR | Status: DC | PRN

## 2021-07-04 MED ORDER — PHENYLEPHRINE HCL (PRESSORS) 10 MG/ML IV SOLN
INTRAVENOUS | Status: AC
  Filled 2021-07-04: qty 1

## 2021-07-04 MED ORDER — MEPERIDINE HCL 25 MG/ML IJ SOLN: 6.2500 mg | INTRAMUSCULAR | Status: DC | PRN

## 2021-07-04 MED ORDER — PROMETHAZINE HCL 25 MG/ML IJ SOLN: 6.2500 mg | INTRAMUSCULAR | Status: DC | PRN

## 2021-07-04 MED ORDER — FENTANYL CITRATE (PF) 100 MCG/2ML IJ SOLN
INTRAMUSCULAR | Status: AC
  Filled 2021-07-04: qty 2

## 2021-07-04 MED ORDER — LACTATED RINGERS IV SOLN: INTRAVENOUS | Status: DC

## 2021-07-04 MED ORDER — ACETAMINOPHEN 500 MG PO TABS
1000.0000 mg | ORAL_TABLET | Freq: Once | ORAL | Status: AC
  Administered 2021-07-04: 1000 mg via ORAL

## 2021-07-04 MED ORDER — CEFAZOLIN SODIUM-DEXTROSE 2-4 GM/100ML-% IV SOLN
2.0000 g | INTRAVENOUS | Status: AC
  Administered 2021-07-04: 2 g via INTRAVENOUS

## 2021-07-04 MED ORDER — OXYCODONE HCL 5 MG PO TABS
5.0000 mg | ORAL_TABLET | Freq: Four times a day (QID) | ORAL | 0 refills | Status: DC | PRN
Start: 2021-07-04 — End: 2021-08-30
  Filled 2021-07-04: qty 24, 4d supply, fill #0

## 2021-07-04 MED ORDER — EPHEDRINE SULFATE (PRESSORS) 50 MG/ML IJ SOLN
INTRAMUSCULAR | Status: DC | PRN
  Administered 2021-07-04: 10 mg via INTRAVENOUS

## 2021-07-04 MED ORDER — OXYCODONE HCL 5 MG PO TABS: 5.0000 mg | ORAL_TABLET | Freq: Once | ORAL | Status: DC | PRN

## 2021-07-04 MED ORDER — PROPOFOL 10 MG/ML IV BOLUS
INTRAVENOUS | Status: DC | PRN
  Administered 2021-07-04: 120 mg via INTRAVENOUS

## 2021-07-04 MED ORDER — MIDAZOLAM HCL 2 MG/2ML IJ SOLN
2.0000 mg | Freq: Once | INTRAMUSCULAR | Status: AC
  Administered 2021-07-04: 1 mg via INTRAVENOUS

## 2021-07-04 MED ORDER — DEXAMETHASONE SODIUM PHOSPHATE 10 MG/ML IJ SOLN
INTRAMUSCULAR | Status: DC | PRN
Start: 2021-07-04 — End: 2021-07-04
  Administered 2021-07-04: 5 mg via INTRAVENOUS

## 2021-07-04 MED ORDER — ACETAMINOPHEN 500 MG PO TABS
1000.0000 mg | ORAL_TABLET | Freq: Three times a day (TID) | ORAL | 0 refills | Status: AC
  Filled 2021-07-04: qty 84, 14d supply, fill #0

## 2021-07-04 MED ORDER — ROCURONIUM BROMIDE 100 MG/10ML IV SOLN
INTRAVENOUS | Status: DC | PRN
  Administered 2021-07-04: 60 mg via INTRAVENOUS

## 2021-07-04 MED ORDER — ONDANSETRON HCL 4 MG/2ML IJ SOLN
INTRAMUSCULAR | Status: DC | PRN
  Administered 2021-07-04: 4 mg via INTRAVENOUS

## 2021-07-04 SURGICAL SUPPLY — 63 items
AID PSTN UNV HD RSTRNT DISP (MISCELLANEOUS) ×1
ANCH SUT 2 FBRTK KNTLS 1.8 (Anchor) ×2 IMPLANT
ANCHOR SUT 1.8 FIBERTAK SB KL (Anchor) ×2 IMPLANT
APL PRP STRL LF DISP 70% ISPRP (MISCELLANEOUS) ×1
BLADE EXCALIBUR 4.0X13 (MISCELLANEOUS) ×2 IMPLANT
BURR OVAL 8 FLU 4.0X13 (MISCELLANEOUS) ×1 IMPLANT
CANNULA 5.75X71 LONG (CANNULA) IMPLANT
CANNULA PASSPORT 5 (CANNULA) IMPLANT
CANNULA PASSPORT BUTTON 10-40 (CANNULA) ×1 IMPLANT
CANNULA TWIST IN 8.25X7CM (CANNULA) IMPLANT
CHLORAPREP W/TINT 26 (MISCELLANEOUS) ×2 IMPLANT
COOLER ICEMAN CLASSIC (MISCELLANEOUS) ×2 IMPLANT
DRAPE IMP U-DRAPE 54X76 (DRAPES) ×2 IMPLANT
DRAPE INCISE IOBAN 66X45 STRL (DRAPES) IMPLANT
DRAPE SHOULDER BEACH CHAIR (DRAPES) ×2 IMPLANT
DRSG PAD ABDOMINAL 8X10 ST (GAUZE/BANDAGES/DRESSINGS) ×2 IMPLANT
DW OUTFLOW CASSETTE/TUBE SET (MISCELLANEOUS) ×2 IMPLANT
GAUZE 4X4 16PLY ~~LOC~~+RFID DBL (SPONGE) ×1 IMPLANT
GAUZE SPONGE 4X4 12PLY STRL (GAUZE/BANDAGES/DRESSINGS) ×2 IMPLANT
GLOVE SRG 8 PF TXTR STRL LF DI (GLOVE) ×1 IMPLANT
GLOVE SURG ENC MOIS LTX SZ6.5 (GLOVE) ×2 IMPLANT
GLOVE SURG LTX SZ8 (GLOVE) ×2 IMPLANT
GLOVE SURG POLYISO LF SZ7 (GLOVE) ×1 IMPLANT
GLOVE SURG UNDER POLY LF SZ6.5 (GLOVE) ×2 IMPLANT
GLOVE SURG UNDER POLY LF SZ7 (GLOVE) ×2 IMPLANT
GLOVE SURG UNDER POLY LF SZ8 (GLOVE) ×2
GOWN STRL REUS W/ TWL LRG LVL3 (GOWN DISPOSABLE) ×2 IMPLANT
GOWN STRL REUS W/TWL LRG LVL3 (GOWN DISPOSABLE) ×4
GOWN STRL REUS W/TWL XL LVL3 (GOWN DISPOSABLE) ×2 IMPLANT
KIT SHOULDER STAB MARCO (KITS) ×2 IMPLANT
KIT STR SPEAR 1.8 FBRTK DISP (KITS) ×1 IMPLANT
LASSO 90 CVE QUICKPAS (DISPOSABLE) IMPLANT
LASSO CRESCENT QUICKPASS (SUTURE) IMPLANT
MANIFOLD NEPTUNE II (INSTRUMENTS) ×2 IMPLANT
NDL SAFETY ECLIPSE 18X1.5 (NEEDLE) ×1 IMPLANT
NDL SCORPION MULTI FIRE (NEEDLE) IMPLANT
NEEDLE HYPO 18GX1.5 SHARP (NEEDLE) ×2
NEEDLE SCORPION MULTI FIRE (NEEDLE) IMPLANT
PACK ARTHROSCOPY DSU (CUSTOM PROCEDURE TRAY) ×2 IMPLANT
PACK BASIN DAY SURGERY FS (CUSTOM PROCEDURE TRAY) ×2 IMPLANT
PAD COLD SHLDR WRAP-ON (PAD) ×2 IMPLANT
PAD ORTHO SHOULDER 7X19 LRG (SOFTGOODS) ×1 IMPLANT
PORT APPOLLO RF 90DEGREE MULTI (SURGICAL WAND) ×2 IMPLANT
RESTRAINT HEAD UNIVERSAL NS (MISCELLANEOUS) ×2 IMPLANT
SHEET MEDIUM DRAPE 40X70 STRL (DRAPES) ×2 IMPLANT
SLEEVE SCD COMPRESS KNEE MED (STOCKING) ×2 IMPLANT
SLING ARM FOAM STRAP LRG (SOFTGOODS) IMPLANT
SLING ULTRA II SMALL (SOFTGOODS) ×1 IMPLANT
STRIP CLOSURE SKIN 1/2X4 (GAUZE/BANDAGES/DRESSINGS) ×2 IMPLANT
SUT FIBERWIRE #2 38 T-5 BLUE (SUTURE)
SUT MNCRL AB 4-0 PS2 18 (SUTURE) ×2 IMPLANT
SUT PDS AB 0 CT 36 (SUTURE) ×1 IMPLANT
SUT PDS AB 1 CT  36 (SUTURE)
SUT PDS AB 1 CT 36 (SUTURE) IMPLANT
SUT TIGER TAPE 7 IN WHITE (SUTURE) IMPLANT
SUTURE FIBERWR #2 38 T-5 BLUE (SUTURE) IMPLANT
SUTURE TAPE TIGERLINK 1.3MM BL (SUTURE) IMPLANT
SUTURETAPE TIGERLINK 1.3MM BL (SUTURE)
SYR 5ML LL (SYRINGE) ×2 IMPLANT
TAPE FIBER 2MM 7IN #2 BLUE (SUTURE) IMPLANT
TOWEL GREEN STERILE FF (TOWEL DISPOSABLE) ×4 IMPLANT
TUBE CONNECTING 20X1/4 (TUBING) ×2 IMPLANT
TUBING ARTHROSCOPY IRRIG 16FT (MISCELLANEOUS) ×2 IMPLANT

## 2021-07-04 NOTE — Anesthesia Postprocedure Evaluation (Signed)
Anesthesia Post Note  Patient: Rachel Beasley  Procedure(s) Performed: ARTHROSCOPY SHOULDER (Right: Shoulder) SHOULDER ACROMIOPLASTY (Right: Shoulder) BICEPS TENODESIS (Right: Shoulder)     Patient location during evaluation: PACU Anesthesia Type: General and Regional Level of consciousness: awake and alert, patient cooperative and oriented Pain management: pain level controlled Vital Signs Assessment: post-procedure vital signs reviewed and stable Respiratory status: spontaneous breathing, nonlabored ventilation and respiratory function stable Cardiovascular status: blood pressure returned to baseline and stable Postop Assessment: no apparent nausea or vomiting and able to ambulate Anesthetic complications: no   No notable events documented.  Last Vitals:  Vitals:   07/04/21 1300 07/04/21 1315  BP: (!) 106/58 105/61  Pulse: 85 81  Resp: 18 19  Temp:    SpO2: 99% 96%    Last Pain:  Vitals:   07/04/21 1315  TempSrc:   PainSc: 0-No pain                 Sundae Maners,E. Laresha Bacorn

## 2021-07-04 NOTE — Interval H&P Note (Signed)
All questions answered, patient wants to proceed with procedure. ? ?

## 2021-07-04 NOTE — H&P (Signed)
PREOPERATIVE H&P  Chief Complaint: right shoulder cartilage disorder, impingement syndrome, bicep tendinits  HPI: Rachel Beasley is a 66 y.o. female who is scheduled for, Procedure(s): ARTHROSCOPY SHOULDER SHOULDER ACROMIOPLASTY BICEPS TENODESIS.   The patient is a healthy 66 year old female who avidly plays tennis.  She has had pain in her right shoulder since the summer.  She has not been able to play regularly.  She has been frustrated with her overall condition. She has tried physical therapy, medicines, and time, and has not made much progress.    Her symptoms are rated as moderate to severe, and have been worsening.  This is significantly impairing activities of daily living.    Please see clinic note for further details on this patient's care.    She has elected for surgical management.   Past Medical History:  Diagnosis Date   Dyspareunia    Endometriosis    Past Surgical History:  Procedure Laterality Date   CESAREAN SECTION  1991   LAPAROSCOPY  1989   OVARIAN CYST REMOVAL  1988   benign- endometrioma- laparatomy then danazol 6 months   SPINAL FUSION  2006   C5-6 rupture   Social History   Socioeconomic History   Marital status: Married    Spouse name: Not on file   Number of children: Not on file   Years of education: Not on file   Highest education level: Not on file  Occupational History   Not on file  Tobacco Use   Smoking status: Never   Smokeless tobacco: Never  Vaping Use   Vaping Use: Never used  Substance and Sexual Activity   Alcohol use: Yes    Alcohol/week: 0.0 - 2.0 standard drinks   Drug use: No   Sexual activity: Yes    Partners: Male    Birth control/protection: Post-menopausal  Other Topics Concern   Not on file  Social History Narrative   Not on file   Social Determinants of Health   Financial Resource Strain: Not on file  Food Insecurity: Not on file  Transportation Needs: Not on file  Physical Activity: Not on file   Stress: Not on file  Social Connections: Not on file   Family History  Problem Relation Age of Onset   Diabetes Maternal Grandfather    Heart disease Maternal Grandfather    Polymyalgia rheumatica Father    Renal Disease Father    Hypertension Father    Stroke Father        with memory loss   Heart disease Paternal Grandfather    Hypertension Mother    Hyperlipidemia Mother    Stroke Mother    No Known Allergies Prior to Admission medications   Medication Sig Start Date End Date Taking? Authorizing Provider  Calcium Carbonate-Vitamin D (CALCIUM 500 + D PO) Take 1 tablet by mouth daily.    [provider]  EPINEPHrine 0.3 mg/0.3 mL IJ SOAJ injection SMARTSIG:0.3 Milliliter(s) IM Once PRN 07/12/19   [provider]  estradiol (VIVELLE-DOT) 0.025 MG/24HR Place 1 patch onto the skin 2 (two) times a week. 04/05/21   Megan Salon, MD  polyethylene glycol-electrolytes (NULYTELY) 420 g solution Use as directed 01/15/21     progesterone (PROMETRIUM) 100 MG capsule Take 1 capsule (100 mg total) by mouth in the morning and at bedtime. 05/15/21   Megan Salon, MD  zolpidem (AMBIEN) 5 MG tablet TAKE 1 TABLET (5 MG TOTAL) BY MOUTH AT BEDTIME AS NEEDED FOR SLEEP. 03/10/20  09/06/20  Megan Salon, MD    ROS: All other systems have been reviewed and were otherwise negative with the exception of those mentioned in the HPI and as above.  Physical Exam: General: Alert, no acute distress Cardiovascular: No pedal edema Respiratory: No cyanosis, no use of accessory musculature GI: No organomegaly, abdomen is soft and non-tender Skin: No lesions in the area of chief complaint Neurologic: Sensation intact distally Psychiatric: Patient is competent for consent with normal mood and affect Lymphatic: No axillary or cervical lymphadenopathy  MUSCULOSKELETAL:  Active forward elevation to 150; passive to 160; external rotation to 50 versus 60; internal rotation to back pocket versus T3.   Negative AC tenderness to palpation; positive impingement, positive O'Brien's, positive Speed's.  5-/5 subscapularis testing.  Imaging: MRI demonstrates intrasubstance tearing of the biceps.  There is some fluid at the subscapularis insertion possibly consistent with a subscapularis tear as well.  There is a type 2 acromion.  Assessment: right shoulder cartilage disorder, impingement syndrome, bicep tendinits  Plan: Plan for Procedure(s): ARTHROSCOPY SHOULDER SHOULDER ACROMIOPLASTY BICEPS TENODESIS  The risks benefits and alternatives were discussed with the patient including but not limited to the risks of nonoperative treatment, versus surgical intervention including infection, bleeding, nerve injury,  blood clots, cardiopulmonary complications, morbidity, mortality, among others, and they were willing to proceed.   The patient acknowledged the explanation, agreed to proceed with the plan and consent was signed.   Operative Plan: Right shoulder scope with subacromial decompression, biceps tenodesis, and consideration for a possible subscapularis repair if necessary.   Discharge Medications: Standard DVT Prophylaxis: None Physical Therapy: Outpatient PT Special Discharge needs: S[place]. [place], PA-C  07/04/2021 6:26 AM

## 2021-07-04 NOTE — Progress Notes (Signed)
AssistedDr. Carswell Jackson with right, ultrasound guided, interscalene  block. Side rails up, monitors on throughout procedure. See vital signs in flow sheet. Tolerated Procedure well.  

## 2021-07-04 NOTE — Op Note (Signed)
Orthopaedic Surgery Operative Note (CSN: [id number])  Rachel Beasley  11-Nov-1955 Date of Surgery: 07/04/2021   DIAGNOSES: Right shoulder, acute atraumatic rotator cuff tear, biceps tendinitis, and subacromial impingement.  POST-OPERATIVE DIAGNOSIS: same  PROCEDURE: Arthroscopic extensive debridement - 29823 Subdeltoid Bursa, Supraspinatus Tendon, Superior Labrum, and anterior labrum Arthroscopic subacromial decompression - 29826 Arthroscopic biceps tenodesis - 78295   OPERATIVE FINDING: Exam under anesthesia: Normal Articular space: Normal, mild anterior labral fraying Chondral surfaces: Normal Biceps:  Significant tearing of the biceps itself.  The anchor was intact but the biceps tendon was torn. The biceps itself was very inflamed and irritated. Subscapularis: Incomplete tear about 10% of the articular side of the upper border the subscapularis was torn away likely as a result of the chronic biceps tendinitis.  This was debrided back. Supraspinatus: Incomplete tear 10% undersurface articular sided leading edge tear again consistent with chronic erosion from chronic biceps tendinitis.  This was debrided back. Infraspinatus: Intact      Post-operative plan: The patient will be non-weightbearing in a sling for 4 weeks.  The patient will be discharged home.  DVT prophylaxis not indicated in ambulatory upper extremity patient without known risk factors.   Pain control with PRN pain medication preferring oral medicines.  Follow up plan will be scheduled in approximately 7 days for incision check and XR.  Surgeons:Primary: Hiram Gash, MD Assistants:Caroline McBane PA-C Location: [place] OR ROOM 2 Anesthesia: General with Exparel interscalene block Antibiotics: Ancef 2 g Tourniquet time: None Estimated Blood Loss: Minimal Complications: None Specimens: None Implants: Implant Name Type Inv. Item Serial No. Manufacturer Lot No. LRB No. Used Action  ANCHOR SUT 1.8 FIBERTAK SB KL -  [id number] Anchor ANCHOR SUT 1.8 FIBERTAK SB KL  [employer] [id number] Right 1 Implanted  ANCHOR SUT 1.8 FIBERTAK SB KL - [id number] Anchor ANCHOR SUT 1.8 [employer] [id number] Right 1 Implanted    Indications for Surgery:   Rachel Beasley is a 66 y.o. female with continued shoulder pain refractory to nonoperative measures for extended period of time.    The risks and benefits were explained at length including but not limited to continued pain, cuff failure, biceps tenodesis failure, stiffness, need for further surgery and infection.   Procedure:   Patient was correctly identified in the preoperative holding area and operative site marked.  Patient brought to OR and positioned beachchair on an [place] table ensuring that all bony prominences were padded and the head was in an appropriate location.  Anesthesia was induced and the operative shoulder was prepped and draped in the usual sterile fashion.  Timeout was called preincision.  A standard posterior viewing portal was made after localizing the portal with a spinal needle.  An anterior accessory portal was also made.  After clearing the articular space the camera was positioned in the subacromial space.  Findings above.    Extensive debridement was performed of the anterior interval tissue, labral fraying and the bursa.  We additionally debrided the supraspinatus leading edge and the subscapularis but only about 10% of each tendon was torn on the articular side.  Subacromial decompression: We made a lateral portal with spinal needle guidance. We then proceeded to debride bursal tissue extensively with a shaver and arthrocare device. At that point we continued to identify the borders of the acromion and identify the spur. We then carefully preserved the deltoid fascia and used a burr to convert the acromion to a Type 1 flat acromion without issue.  Biceps tenodesis: We marked the tendon and then performed a tenotomy and debridement of  the stump in the articular space. We then identified the biceps tendon in its groove suprapec with the arthroscope in the lateral portal taking care to move from lateral to medial to avoid injury to the subscapularis. At that point we unroofed the tendon itself and mobilized it. An accessory anterior portal was made in line with the tendon and we grasped it from the anterior superior portal and worked from the accessory anterior portal. Two Fibertak 1.63mm knotless anchors were placed in the groove and the tendon was secured in a luggage loop style fashion with a pass of the limb of suture through the tendon using a scorpion device to avoid pull-through.  Repair was completed with good tension on the tendon.  Residual stump of the tendon was removed after being resected with a RF ablator. The incisions were closed with absorbable monocryl and steri strips.  A sterile dressing was placed along with a sling. The patient was awoken from general anesthesia and taken to the PACU in stable condition without complication.   Noemi Chapel, PA-C, present and scrubbed throughout the case, critical for completion in a timely fashion, and for retraction, instrumentation, closure.

## 2021-07-04 NOTE — Transfer of Care (Signed)
Immediate Anesthesia Transfer of Care Note  Patient: Rachel Beasley  Procedure(s) Performed: ARTHROSCOPY SHOULDER (Right: Shoulder) SHOULDER ACROMIOPLASTY (Right: Shoulder) BICEPS TENODESIS (Right: Shoulder)  Patient Location: PACU  Anesthesia Type:GA combined with regional for post-op pain  Level of Consciousness: awake, alert  and oriented  Airway & Oxygen Therapy: Patient Spontanous Breathing and Patient connected to face mask oxygen  Post-op Assessment: Report given to RN and Post -op Vital signs reviewed and stable  Post vital signs: Reviewed and stable  Last Vitals:  Vitals Value Taken Time  BP 113/53 07/04/21 1248  Temp    Pulse 85 07/04/21 1251  Resp 19 07/04/21 1251  SpO2 99 % 07/04/21 1251  Vitals shown include unvalidated device data.  Last Pain:  Vitals:   07/04/21 1021  TempSrc: Oral  PainSc: 0-No pain      Patients Stated Pain Goal: 4 (70/62/37 6283)  Complications: No notable events documented.

## 2021-07-04 NOTE — Anesthesia Procedure Notes (Signed)
Anesthesia Regional Block: Interscalene brachial plexus block   Pre-Anesthetic Checklist: , timeout performed,  Correct Patient, Correct Site, Correct Laterality,  Correct Procedure, Correct Position, site marked,  Risks and benefits discussed,  Surgical consent,  Pre-op evaluation,  At surgeon's request and post-op pain management  Laterality: Right and Upper  Prep: chloraprep       Needles:  Injection technique: Single-shot  Needle Type: Echogenic Needle     Needle Length: 9cm  Needle Gauge: 21     Additional Needles:   Procedures:,,,, ultrasound used (permanent image in chart),,    Narrative:  Start time: 07/04/2021 10:44 AM End time: 07/04/2021 10:50 AM Injection made incrementally with aspirations every 5 mL.  Performed by: Personally  Anesthesiologist: Annye Asa, MD  Additional Notes: Pt identified in Holding room.  Monitors applied. Working IV access confirmed. Sterile prep R clavicle and neck.  #21ga ECHOgenic Arrow block needle to interscalene brach plexus with US guidance.  15cc 0.5% Bupivacaine with 1:200k epi and Exparel injected incrementally after negative test dose.  Patient asymptomatic, VSS, no heme aspirated, tolerated well.   Jenita Seashore, MD

## 2021-07-04 NOTE — Discharge Instructions (Addendum)
Ophelia Charter MD, MPH Noemi Chapel, PA-C [facility] [address], [address] [phone] (tel)   [phone] (fax)   POST-OPERATIVE INSTRUCTIONS - SHOULDER ARTHROSCOPY  WOUND CARE You may remove the Operative Dressing on Post-Op Day #3 (72hrs after surgery).   Alternatively if you would like you can leave dressing on until follow-up if within 7-8 days but keep it dry. Leave steri-strips in place until they fall off on their own, usually 2 weeks postop. There may be a small amount of fluid/bleeding leaking at the surgical site.  This is normal; the shoulder is filled with fluid during the procedure and can leak for 24-48hrs after surgery.  You may change/reinforce the bandage as needed.  Use the Cryocuff or Ice as often as possible for the first 7 days, then as needed for pain relief. Always keep a towel, ACE wrap or other barrier between the cooling unit and your skin.  You may shower on Post-Op Day #3. Gently pat the area dry. Do not soak the shoulder in water or submerge it. Keep incisions as dry as possible. Do not go swimming in the pool or ocean until 4 weeks after surgery or when otherwise instructed.    EXERCISES/BRACING Sling should be used at all times until follow-up.  You can remove sling for hygiene.    Please continue to ambulate and do not stay sitting or lying for too long. Perform foot and wrist pumps to assist in circulation.  PHYSICAL THERAPY - You will begin physical therapy soon after surgery (unless otherwise specified) - Please call to set up an appointment, if you do not already have one  - Let our office if there are any issues with scheduling your therapy    POST-OP MEDICATIONS- Multimodal approach to pain control In general your pain will be controlled with a combination of substances.  Prescriptions unless otherwise discussed are electronically sent to your pharmacy.  This is a carefully made plan we use to minimize narcotic use.      Celebrex - Anti-inflammatory medication taken on a scheduled basis Acetaminophen - Non-narcotic pain medicine taken on a scheduled basis. No Tylenol until after 4:30pm today. Oxycodone - This is a strong narcotic, to be used only on an as needed basis for SEVERE pain. Zofran - take as needed for nausea   FOLLOW-UP If you develop a Fever (?101.5), Redness or Drainage from the surgical incision site, please call our office to arrange for an evaluation. Please call the office to schedule a follow-up appointment if you do not already have one, 7-10 days post-operatively.    HELPFUL INFORMATION  If you had a block, it will wear off between 8-24 hrs postop typically.  This is period when your pain may go from nearly zero to the pain you would have had postop without the block.  This is an abrupt transition but nothing dangerous is happening.  You may take an extra dose of narcotic when this happens.  You may be more comfortable sleeping in a semi-seated position the first few nights following surgery.  Keep a pillow propped under the elbow and forearm for comfort.  If you have a recliner type of chair it might be beneficial.  If not that is fine too, but it would be helpful to sleep propped up with pillows behind your operated shoulder as well under your elbow and forearm.  This will reduce pulling on the suture lines.  When dressing, put your operative arm in the sleeve  first.  When getting undressed, take your operative arm out last.  Loose fitting, button-down shirts are recommended.  Often in the first days after surgery you may be more comfortable keeping your operative arm under your shirt and not through the sleeve.  You may return to work/school in the next couple of days when you feel up to it.  Desk work and typing in the sling is fine.  We suggest you use the pain medication the first night prior to going to bed, in order to ease any pain when the anesthesia wears off. You should  avoid taking pain medications on an empty stomach as it will make you nauseous.  You should wean off your narcotic medicines as soon as you are able.  Most patients will be off or using minimal narcotics before their first postop appointment.   Do not drink alcoholic beverages or take illicit drugs when taking pain medications.  It is against the law to drive while taking narcotics.  In some states it is against the law to drive while your arm is in a sling.   Pain medication may make you constipated.  Below are a few solutions to try in this order: Decrease the amount of pain medication if you aren't having pain. Drink lots of decaffeinated fluids. Drink prune juice and/or eat dried prunes  If the first 3 don't work start with additional solutions Take Colace - an over-the-counter stool softener Take Senokot - an over-the-counter laxative Take Miralax - a stronger over-the-counter laxative  For more information including helpful videos and documents visit our website:   [email or web address]   Post Anesthesia Home Care Instructions  Activity: Get plenty of rest for the remainder of the day. A responsible individual must stay with you for 24 hours following the procedure.  For the next 24 hours, DO NOT: -Drive a car -Paediatric nurse -Drink alcoholic beverages -Take any medication unless instructed by your physician -Make any legal decisions or sign important papers.  Meals: Start with liquid foods such as gelatin or soup. Progress to regular foods as tolerated. Avoid greasy, spicy, heavy foods. If nausea and/or vomiting occur, drink only clear liquids until the nausea and/or vomiting subsides. Call your physician if vomiting continues.  Special Instructions/Symptoms: Your throat may feel dry or sore from the anesthesia or the breathing tube placed in your throat during surgery. If this causes discomfort, gargle with warm salt water. The  discomfort should disappear within 24 hours.  If you had a scopolamine patch placed behind your ear for the management of post- operative nausea and/or vomiting:  1. The medication in the patch is effective for 72 hours, after which it should be removed.  Wrap patch in a tissue and discard in the trash. Wash hands thoroughly with soap and water. 2. You may remove the patch earlier than 72 hours if you experience unpleasant side effects which may include dry mouth, dizziness or visual disturbances. 3. Avoid touching the patch. Wash your hands with soap and water after contact with the patch.    Regional Anesthesia Blocks  1. Numbness or the inability to move the "blocked" extremity may last from 3-48 hours after placement. The length of time depends on the medication injected and your individual response to the medication. If the numbness is not going away after 48 hours, call your surgeon.  2. The extremity that is blocked will need to be protected until the numbness is gone and the  Strength has returned. Because you cannot  feel it, you will need to take extra care to avoid injury. Because it may be weak, you may have difficulty moving it or using it. You may not know what position it is in without looking at it while the block is in effect.  3. For blocks in the legs and feet, returning to weight bearing and walking needs to be done carefully. You will need to wait until the numbness is entirely gone and the strength has returned. You should be able to move your leg and foot normally before you try and bear weight or walk. You will need someone to be with you when you first try to ensure you do not fall and possibly risk injury.  4. Bruising and tenderness at the needle site are common side effects and will resolve in a few days.  5. Persistent numbness or new problems with movement should be communicated to the surgeon or the [facility] [phone] [place]  [phone]). Information for Discharge Teaching: EXPAREL (bupivacaine liposome injectable suspension)   Your surgeon or anesthesiologist gave you EXPAREL(bupivacaine) to help control your pain after surgery.  EXPAREL is a local anesthetic that provides pain relief by numbing the tissue around the surgical site. EXPAREL is designed to release pain medication over time and can control pain for up to 72 hours. Depending on how you respond to EXPAREL, you may require less pain medication during your recovery.  Possible side effects: Temporary loss of sensation or ability to move in the area where bupivacaine was injected. Nausea, vomiting, constipation Rarely, numbness and tingling in your mouth or lips, lightheadedness, or anxiety may occur. Call your doctor right away if you think you may be experiencing any of these sensations, or if you have other questions regarding possible side effects.  Follow all other discharge instructions given to you by your surgeon or nurse. Eat a healthy diet and drink plenty of water or other fluids.  If you return to the hospital for any reason within 96 hours following the administration of EXPAREL, it is important for health care providers to know that you have received this anesthetic. A teal colored band has been placed on your arm with the date, time and amount of EXPAREL you have received in order to alert and inform your health care providers. Please leave this armband in place for the full 96 hours following administration, and then you may remove the band.

## 2021-07-04 NOTE — Anesthesia Preprocedure Evaluation (Addendum)
Anesthesia Evaluation  Patient identified by MRN, date of birth, ID band Patient awake    Reviewed: Allergy & Precautions, NPO status , Patient's Chart, lab work & pertinent test results  History of Anesthesia Complications Negative for: history of anesthetic complications  Airway Mallampati: I  TM Distance: >3 FB Neck ROM: Full    Dental  (+) Teeth Intact, Dental Advisory Given   Pulmonary neg pulmonary ROS,    breath sounds clear to auscultation       Cardiovascular  Rhythm:Regular Rate:Normal     Neuro/Psych negative neurological ROS     GI/Hepatic negative GI ROS, Neg liver ROS,   Endo/Other  negative endocrine ROS  Renal/GU negative Renal ROS     Musculoskeletal  (+) Arthritis ,   Abdominal   Peds  Hematology negative hematology ROS (+)   Anesthesia Other Findings   Reproductive/Obstetrics                             Anesthesia Physical Anesthesia Plan  ASA: 1  Anesthesia Plan: General   Post-op Pain Management: Tylenol PO (pre-op) and Regional block   Induction: Intravenous  PONV Risk Score and Plan: 3 and Ondansetron, Dexamethasone, Scopolamine patch - Pre-op and Treatment may vary due to age or medical condition  Airway Management Planned: Oral ETT  Additional Equipment: None  Intra-op Plan:   Post-operative Plan: Extubation in OR  Informed Consent: I have reviewed the patients History and Physical, chart, labs and discussed the procedure including the risks, benefits and alternatives for the proposed anesthesia with the patient or authorized representative who has indicated his/her understanding and acceptance.     Dental advisory given  Plan Discussed with: CRNA and Surgeon  Anesthesia Plan Comments: (Plan routine monitors, GETA with interscalene block for post op analgesia)       Anesthesia Quick Evaluation

## 2021-07-04 NOTE — Anesthesia Procedure Notes (Signed)
Procedure Name: Intubation Date/Time: 07/04/2021 11:50 AM Performed by: Lavonia Dana, CRNA Pre-anesthesia Checklist: Patient identified, Emergency Drugs available, Suction available and Patient being monitored Patient Re-evaluated:Patient Re-evaluated prior to induction Oxygen Delivery Method: Circle system utilized Preoxygenation: Pre-oxygenation with 100% oxygen Induction Type: IV induction Ventilation: Mask ventilation without difficulty Laryngoscope Size: Mac and 3 Grade View: Grade I Tube type: Oral Tube size: 7.0 mm Number of attempts: 1 Airway Equipment and Method: Stylet and Bite block Placement Confirmation: ETT inserted through vocal cords under direct vision, positive ETCO2 and breath sounds checked- equal and bilateral Secured at: 22 cm Tube secured with: Tape Dental Injury: Teeth and Oropharynx as per pre-operative assessment

## 2021-07-05 ENCOUNTER — Encounter (HOSPITAL_BASED_OUTPATIENT_CLINIC_OR_DEPARTMENT_OTHER): Payer: Self-pay | Admitting: Orthopaedic Surgery

## 2021-07-10 DIAGNOSIS — M6281 Muscle weakness (generalized): Secondary | ICD-10-CM | POA: Diagnosis not present

## 2021-07-10 DIAGNOSIS — M25611 Stiffness of right shoulder, not elsewhere classified: Secondary | ICD-10-CM | POA: Diagnosis not present

## 2021-07-10 DIAGNOSIS — S46111D Strain of muscle, fascia and tendon of long head of biceps, right arm, subsequent encounter: Secondary | ICD-10-CM | POA: Diagnosis not present

## 2021-07-10 DIAGNOSIS — M7541 Impingement syndrome of right shoulder: Secondary | ICD-10-CM | POA: Diagnosis not present

## 2021-07-10 DIAGNOSIS — M25511 Pain in right shoulder: Secondary | ICD-10-CM | POA: Diagnosis not present

## 2021-07-12 DIAGNOSIS — M25611 Stiffness of right shoulder, not elsewhere classified: Secondary | ICD-10-CM | POA: Diagnosis not present

## 2021-07-12 DIAGNOSIS — M6281 Muscle weakness (generalized): Secondary | ICD-10-CM | POA: Diagnosis not present

## 2021-07-12 DIAGNOSIS — M25511 Pain in right shoulder: Secondary | ICD-10-CM | POA: Diagnosis not present

## 2021-07-12 DIAGNOSIS — M7541 Impingement syndrome of right shoulder: Secondary | ICD-10-CM | POA: Diagnosis not present

## 2021-07-12 DIAGNOSIS — S46111D Strain of muscle, fascia and tendon of long head of biceps, right arm, subsequent encounter: Secondary | ICD-10-CM | POA: Diagnosis not present

## 2021-07-16 ENCOUNTER — Other Ambulatory Visit (HOSPITAL_COMMUNITY): Payer: Self-pay

## 2021-07-16 DIAGNOSIS — M7541 Impingement syndrome of right shoulder: Secondary | ICD-10-CM | POA: Diagnosis not present

## 2021-07-16 DIAGNOSIS — M25511 Pain in right shoulder: Secondary | ICD-10-CM | POA: Diagnosis not present

## 2021-07-16 DIAGNOSIS — M25611 Stiffness of right shoulder, not elsewhere classified: Secondary | ICD-10-CM | POA: Diagnosis not present

## 2021-07-16 DIAGNOSIS — S46111D Strain of muscle, fascia and tendon of long head of biceps, right arm, subsequent encounter: Secondary | ICD-10-CM | POA: Diagnosis not present

## 2021-07-16 DIAGNOSIS — M6281 Muscle weakness (generalized): Secondary | ICD-10-CM | POA: Diagnosis not present

## 2021-07-19 DIAGNOSIS — S46111D Strain of muscle, fascia and tendon of long head of biceps, right arm, subsequent encounter: Secondary | ICD-10-CM | POA: Diagnosis not present

## 2021-07-19 DIAGNOSIS — M25511 Pain in right shoulder: Secondary | ICD-10-CM | POA: Diagnosis not present

## 2021-07-19 DIAGNOSIS — M7541 Impingement syndrome of right shoulder: Secondary | ICD-10-CM | POA: Diagnosis not present

## 2021-07-19 DIAGNOSIS — M6281 Muscle weakness (generalized): Secondary | ICD-10-CM | POA: Diagnosis not present

## 2021-07-19 DIAGNOSIS — M25611 Stiffness of right shoulder, not elsewhere classified: Secondary | ICD-10-CM | POA: Diagnosis not present

## 2021-07-21 ENCOUNTER — Other Ambulatory Visit: Payer: Self-pay | Admitting: Physician Assistant

## 2021-07-21 ENCOUNTER — Ambulatory Visit (HOSPITAL_COMMUNITY)
Admission: RE | Admit: 2021-07-21 | Discharge: 2021-07-21 | Disposition: A | Discharge status: Home / Self Care | Payer: 59 | Source: Ambulatory Visit | Attending: Physician Assistant | Admitting: Physician Assistant

## 2021-07-21 ENCOUNTER — Other Ambulatory Visit: Payer: Self-pay

## 2021-07-21 DIAGNOSIS — M79661 Pain in right lower leg: Secondary | ICD-10-CM

## 2021-07-21 NOTE — Progress Notes (Signed)
VASCULAR LAB    Right lower extremity venous has been performed.  See CV proc for preliminary results.  Called results to Luna Glasgow, PA-C  Shemar Plemmons, RVT 07/21/2021, 12:23 PM

## 2021-07-22 DIAGNOSIS — M25561 Pain in right knee: Secondary | ICD-10-CM | POA: Diagnosis not present

## 2021-07-23 ENCOUNTER — Other Ambulatory Visit (HOSPITAL_COMMUNITY): Payer: Self-pay

## 2021-07-23 DIAGNOSIS — M25611 Stiffness of right shoulder, not elsewhere classified: Secondary | ICD-10-CM | POA: Diagnosis not present

## 2021-07-23 DIAGNOSIS — M7541 Impingement syndrome of right shoulder: Secondary | ICD-10-CM | POA: Diagnosis not present

## 2021-07-23 DIAGNOSIS — M25511 Pain in right shoulder: Secondary | ICD-10-CM | POA: Diagnosis not present

## 2021-07-23 DIAGNOSIS — M6281 Muscle weakness (generalized): Secondary | ICD-10-CM | POA: Diagnosis not present

## 2021-07-23 DIAGNOSIS — S46111D Strain of muscle, fascia and tendon of long head of biceps, right arm, subsequent encounter: Secondary | ICD-10-CM | POA: Diagnosis not present

## 2021-07-23 MED ORDER — CYCLOBENZAPRINE HCL 5 MG PO TABS
5.0000 mg | ORAL_TABLET | Freq: Every evening | ORAL | 0 refills | Status: DC | PRN
  Filled 2021-07-23: qty 30, 30d supply, fill #0

## 2021-07-23 MED ORDER — PREDNISONE 10 MG (21) PO TBPK
ORAL_TABLET | ORAL | 0 refills | Status: DC
  Filled 2021-07-23: qty 21, 6d supply, fill #0

## 2021-07-26 DIAGNOSIS — S46111D Strain of muscle, fascia and tendon of long head of biceps, right arm, subsequent encounter: Secondary | ICD-10-CM | POA: Diagnosis not present

## 2021-07-26 DIAGNOSIS — M25511 Pain in right shoulder: Secondary | ICD-10-CM | POA: Diagnosis not present

## 2021-07-26 DIAGNOSIS — M6281 Muscle weakness (generalized): Secondary | ICD-10-CM | POA: Diagnosis not present

## 2021-07-26 DIAGNOSIS — M25611 Stiffness of right shoulder, not elsewhere classified: Secondary | ICD-10-CM | POA: Diagnosis not present

## 2021-07-26 DIAGNOSIS — M7541 Impingement syndrome of right shoulder: Secondary | ICD-10-CM | POA: Diagnosis not present

## 2021-07-30 DIAGNOSIS — M25511 Pain in right shoulder: Secondary | ICD-10-CM | POA: Diagnosis not present

## 2021-07-30 DIAGNOSIS — M25611 Stiffness of right shoulder, not elsewhere classified: Secondary | ICD-10-CM | POA: Diagnosis not present

## 2021-07-30 DIAGNOSIS — M6281 Muscle weakness (generalized): Secondary | ICD-10-CM | POA: Diagnosis not present

## 2021-07-30 DIAGNOSIS — M7541 Impingement syndrome of right shoulder: Secondary | ICD-10-CM | POA: Diagnosis not present

## 2021-07-30 DIAGNOSIS — S46111D Strain of muscle, fascia and tendon of long head of biceps, right arm, subsequent encounter: Secondary | ICD-10-CM | POA: Diagnosis not present

## 2021-08-01 ENCOUNTER — Other Ambulatory Visit (HOSPITAL_COMMUNITY): Payer: Self-pay

## 2021-08-02 ENCOUNTER — Other Ambulatory Visit (HOSPITAL_COMMUNITY): Payer: Self-pay

## 2021-08-02 DIAGNOSIS — M25511 Pain in right shoulder: Secondary | ICD-10-CM | POA: Diagnosis not present

## 2021-08-02 DIAGNOSIS — S46111D Strain of muscle, fascia and tendon of long head of biceps, right arm, subsequent encounter: Secondary | ICD-10-CM | POA: Diagnosis not present

## 2021-08-02 DIAGNOSIS — M25611 Stiffness of right shoulder, not elsewhere classified: Secondary | ICD-10-CM | POA: Diagnosis not present

## 2021-08-02 DIAGNOSIS — M7541 Impingement syndrome of right shoulder: Secondary | ICD-10-CM | POA: Diagnosis not present

## 2021-08-02 DIAGNOSIS — M6281 Muscle weakness (generalized): Secondary | ICD-10-CM | POA: Diagnosis not present

## 2021-08-02 MED ORDER — CELECOXIB 200 MG PO CAPS
200.0000 mg | ORAL_CAPSULE | Freq: Two times a day (BID) | ORAL | 2 refills | Status: DC
  Filled 2021-08-02: qty 60, 30d supply, fill #0

## 2021-08-07 DIAGNOSIS — M6281 Muscle weakness (generalized): Secondary | ICD-10-CM | POA: Diagnosis not present

## 2021-08-07 DIAGNOSIS — M7541 Impingement syndrome of right shoulder: Secondary | ICD-10-CM | POA: Diagnosis not present

## 2021-08-07 DIAGNOSIS — S46111D Strain of muscle, fascia and tendon of long head of biceps, right arm, subsequent encounter: Secondary | ICD-10-CM | POA: Diagnosis not present

## 2021-08-07 DIAGNOSIS — M25511 Pain in right shoulder: Secondary | ICD-10-CM | POA: Diagnosis not present

## 2021-08-07 DIAGNOSIS — M25611 Stiffness of right shoulder, not elsewhere classified: Secondary | ICD-10-CM | POA: Diagnosis not present

## 2021-08-10 DIAGNOSIS — M7541 Impingement syndrome of right shoulder: Secondary | ICD-10-CM | POA: Diagnosis not present

## 2021-08-10 DIAGNOSIS — M6281 Muscle weakness (generalized): Secondary | ICD-10-CM | POA: Diagnosis not present

## 2021-08-10 DIAGNOSIS — M25611 Stiffness of right shoulder, not elsewhere classified: Secondary | ICD-10-CM | POA: Diagnosis not present

## 2021-08-10 DIAGNOSIS — S46111D Strain of muscle, fascia and tendon of long head of biceps, right arm, subsequent encounter: Secondary | ICD-10-CM | POA: Diagnosis not present

## 2021-08-10 DIAGNOSIS — M25511 Pain in right shoulder: Secondary | ICD-10-CM | POA: Diagnosis not present

## 2021-08-13 DIAGNOSIS — M6281 Muscle weakness (generalized): Secondary | ICD-10-CM | POA: Diagnosis not present

## 2021-08-13 DIAGNOSIS — S46111D Strain of muscle, fascia and tendon of long head of biceps, right arm, subsequent encounter: Secondary | ICD-10-CM | POA: Diagnosis not present

## 2021-08-13 DIAGNOSIS — M25611 Stiffness of right shoulder, not elsewhere classified: Secondary | ICD-10-CM | POA: Diagnosis not present

## 2021-08-13 DIAGNOSIS — M25511 Pain in right shoulder: Secondary | ICD-10-CM | POA: Diagnosis not present

## 2021-08-13 DIAGNOSIS — M7541 Impingement syndrome of right shoulder: Secondary | ICD-10-CM | POA: Diagnosis not present

## 2021-08-16 DIAGNOSIS — M25611 Stiffness of right shoulder, not elsewhere classified: Secondary | ICD-10-CM | POA: Diagnosis not present

## 2021-08-16 DIAGNOSIS — M6281 Muscle weakness (generalized): Secondary | ICD-10-CM | POA: Diagnosis not present

## 2021-08-16 DIAGNOSIS — S46111D Strain of muscle, fascia and tendon of long head of biceps, right arm, subsequent encounter: Secondary | ICD-10-CM | POA: Diagnosis not present

## 2021-08-16 DIAGNOSIS — M25511 Pain in right shoulder: Secondary | ICD-10-CM | POA: Diagnosis not present

## 2021-08-16 DIAGNOSIS — M7541 Impingement syndrome of right shoulder: Secondary | ICD-10-CM | POA: Diagnosis not present

## 2021-08-20 ENCOUNTER — Ambulatory Visit: Payer: 59

## 2021-08-23 DIAGNOSIS — M7541 Impingement syndrome of right shoulder: Secondary | ICD-10-CM | POA: Diagnosis not present

## 2021-08-23 DIAGNOSIS — M6281 Muscle weakness (generalized): Secondary | ICD-10-CM | POA: Diagnosis not present

## 2021-08-23 DIAGNOSIS — M25511 Pain in right shoulder: Secondary | ICD-10-CM | POA: Diagnosis not present

## 2021-08-23 DIAGNOSIS — S46111D Strain of muscle, fascia and tendon of long head of biceps, right arm, subsequent encounter: Secondary | ICD-10-CM | POA: Diagnosis not present

## 2021-08-23 DIAGNOSIS — M25611 Stiffness of right shoulder, not elsewhere classified: Secondary | ICD-10-CM | POA: Diagnosis not present

## 2021-08-29 DIAGNOSIS — M6281 Muscle weakness (generalized): Secondary | ICD-10-CM | POA: Diagnosis not present

## 2021-08-29 DIAGNOSIS — S46111D Strain of muscle, fascia and tendon of long head of biceps, right arm, subsequent encounter: Secondary | ICD-10-CM | POA: Diagnosis not present

## 2021-08-29 DIAGNOSIS — M7541 Impingement syndrome of right shoulder: Secondary | ICD-10-CM | POA: Diagnosis not present

## 2021-08-29 DIAGNOSIS — M25511 Pain in right shoulder: Secondary | ICD-10-CM | POA: Diagnosis not present

## 2021-08-29 DIAGNOSIS — M25611 Stiffness of right shoulder, not elsewhere classified: Secondary | ICD-10-CM | POA: Diagnosis not present

## 2021-08-30 ENCOUNTER — Other Ambulatory Visit (HOSPITAL_COMMUNITY): Payer: Self-pay

## 2021-08-30 ENCOUNTER — Ambulatory Visit (INDEPENDENT_AMBULATORY_CARE_PROVIDER_SITE_OTHER): Payer: 59 | Admitting: Obstetrics & Gynecology

## 2021-08-30 ENCOUNTER — Encounter (HOSPITAL_BASED_OUTPATIENT_CLINIC_OR_DEPARTMENT_OTHER): Payer: Self-pay | Admitting: Obstetrics & Gynecology

## 2021-08-30 VITALS — BP 110/60 | HR 71 | Ht 64.0 in | Wt 110.8 lb

## 2021-08-30 DIAGNOSIS — Z01419 Encounter for gynecological examination (general) (routine) without abnormal findings: Secondary | ICD-10-CM

## 2021-08-30 DIAGNOSIS — Z79890 Hormone replacement therapy (postmenopausal): Secondary | ICD-10-CM | POA: Diagnosis not present

## 2021-08-30 DIAGNOSIS — F5101 Primary insomnia: Secondary | ICD-10-CM | POA: Diagnosis not present

## 2021-08-30 DIAGNOSIS — M8580 Other specified disorders of bone density and structure, unspecified site: Secondary | ICD-10-CM

## 2021-08-30 DIAGNOSIS — R1903 Right lower quadrant abdominal swelling, mass and lump: Secondary | ICD-10-CM

## 2021-08-30 MED ORDER — ESTRADIOL 0.025 MG/24HR TD PTTW
1.0000 | MEDICATED_PATCH | TRANSDERMAL | 12 refills | Status: DC
  Filled 2021-08-30: qty 24, 84d supply, fill #0
  Filled 2022-03-25: qty 24, 84d supply, fill #1

## 2021-08-30 MED ORDER — PROGESTERONE MICRONIZED 100 MG PO CAPS
100.0000 mg | ORAL_CAPSULE | Freq: Two times a day (BID) | ORAL | 3 refills | Status: DC
  Filled 2021-08-30: qty 180, 90d supply, fill #0
  Filled 2022-03-06: qty 180, 90d supply, fill #1

## 2021-08-30 MED ORDER — ZOLPIDEM TARTRATE 5 MG PO TABS
5.0000 mg | ORAL_TABLET | Freq: Every evening | ORAL | 0 refills | Status: DC | PRN
  Filled 2021-08-30: qty 30, 30d supply, fill #0

## 2021-08-30 NOTE — Progress Notes (Signed)
66 y.o. G1P0103 Married White or Caucasian female here for annual exam.  Had to have surgery on biceps tendon repair in early March.  Doing PT right now.  Has good mobility at this point.   ? ?Denies vaginal bleeding.  Feels better back on HRT.  Does occasional have hot flashes but comfortable at current dosage. ? ?Patient's last menstrual period was 08/12/2011 (exact date).          ?Sexually active: Yes.    ?The current method of family planning is post menopausal status.    ?Smoker:  no ? ?Health Maintenance: ?Pap:  08/28/2020 Negative ?History of abnormal Pap:  yes ?MMG:  05/01/2020 Negative ?Colonoscopy: pt reports she had another one last year ?BMD:   01/12/2019, -1.2 ?Screening Labs: does with PCP ? ? reports that she has never smoked. She has never used smokeless tobacco. She reports current alcohol use. She reports that she does not use drugs. ? ?Past Medical History:  ?Diagnosis Date  ? Dyspareunia   ? Endometriosis   ? ? ?Past Surgical History:  ?Procedure Laterality Date  ? BICEPT TENODESIS Right 07/04/2021  ? Procedure: BICEPS TENODESIS;  Surgeon: Hiram Gash, MD;  Location: [place];  Service: Orthopedics;  Laterality: Right;  ? [place]  ? LAPAROSCOPY  1989  ? OVARIAN CYST REMOVAL  1988  ? benign- endometrioma- laparatomy then danazol 6 months  ? SHOULDER ACROMIOPLASTY Right 07/04/2021  ? Procedure: SHOULDER ACROMIOPLASTY;  Surgeon: Hiram Gash, MD;  Location: [place];  Service: Orthopedics;  Laterality: Right;  ? SHOULDER ARTHROSCOPY Right 07/04/2021  ? Procedure: ARTHROSCOPY SHOULDER;  Surgeon: Hiram Gash, MD;  Location: [place];  Service: Orthopedics;  Laterality: Right;  ? SPINAL FUSION  2006  ? C5-6 rupture  ? ? ?Current Outpatient Medications  ?Medication Sig Dispense Refill  ? Calcium Carbonate-Vitamin D (CALCIUM 500 + D PO) Take 1 tablet by mouth daily.    ? celecoxib (CELEBREX) 200 MG capsule Take 1 capsule by mouth twice a  day 60 capsule 2  ? cyclobenzaprine (FLEXERIL) 5 MG tablet Take 1 tablet (5 mg total) by mouth at bedtime as needed for muscle spasm 30 tablet 0  ? EPINEPHrine 0.3 mg/0.3 mL IJ SOAJ injection SMARTSIG:0.3 Milliliter(s) IM Once PRN    ? estradiol (VIVELLE-DOT) 0.025 MG/24HR Place 1 patch onto the skin 2 (two) times a week. 8 patch 2  ? progesterone (PROMETRIUM) 100 MG capsule Take 1 capsule (100 mg total) by mouth in the morning and at bedtime. 60 capsule 2  ? zolpidem (AMBIEN) 5 MG tablet TAKE 1 TABLET (5 MG TOTAL) BY MOUTH AT BEDTIME AS NEEDED FOR SLEEP. 30 tablet 0  ? ?No current facility-administered medications for this visit.  ? ? ?Family History  ?Problem Relation Age of Onset  ? Diabetes Maternal Grandfather   ? Heart disease Maternal Grandfather   ? Polymyalgia rheumatica Father   ? Renal Disease Father   ? Hypertension Father   ? Stroke Father   ?     with memory loss  ? Heart disease Paternal Grandfather   ? Hypertension Mother   ? Hyperlipidemia Mother   ? Stroke Mother   ? ? ?Review of Systems  ?All other systems reviewed and are negative. ? ?Exam:   ?BP 110/60 (BP Location: Right Arm, Patient Position: Sitting, Cuff Size: Normal)   Pulse 71   Ht '5\' 4"'$  (1.626 m) Comment: Reported  Wt 110  lb 12.8 oz (50.3 kg)   LMP 08/12/2011 (Exact Date)   BMI 19.02 kg/m?   Height: '5\' 4"'$  (162.6 cm) (Reported) ? ?General appearance: alert, cooperative and appears stated age ?Head: Normocephalic, without obvious abnormality, atraumatic ?Neck: no adenopathy, supple, symmetrical, trachea midline and thyroid normal to inspection and palpation ?Lungs: clear to auscultation bilaterally ?Breasts: normal appearance, no masses or tenderness ?Heart: regular rate and rhythm ?Abdomen: soft, non-tender; bowel sounds normal; no masses,  no organomegaly ?Extremities: extremities normal, atraumatic, no cyanosis or edema ?Skin: Skin color, texture, turgor normal. No rashes or lesions ?Lymph nodes: Cervical, supraclavicular, and  axillary nodes normal. ?No abnormal inguinal nodes palpated ?Neurologic: Grossly normal ? ? ?Pelvic: External genitalia:  no lesions ?             Urethra:  normal appearing urethra with no masses, tenderness or lesions ?             Bartholins and Skenes: normal    ?             Vagina: normal appearing vagina with normal color and no discharge, no lesions ?             Cervix: no lesions ?             Pap taken: No. ?Bimanual Exam:  Uterus:  normal size, contour, position, consistency, mobility, non-tender ?             Adnexa: right adnexal mass that is mobile and possibly stool noted today, non tender, left adnexa normal ?              Rectovaginal: Confirms ?              Anus:  normal sphincter tone, no lesions ? ?Chaperone, Octaviano Batty, CMA, was present for exam. ? ?Assessment/Plan: ?1. Well woman exam with routine gynecological exam ?- Pap smear 08/28/2020 ?- Mammogram 04/2020.  Pt aware this is overdue. ?- Colonoscopy done last year per her report.  Release signed today. ?- Bone mineral density 2020 with t score -1.2 ?- lab work done done with PCP ?- vaccines reviewed/updated ? ?2. Hormone replacement therapy (HRT) ?- estradiol (VIVELLE-DOT) 0.025 MG/24HR; Place 1 patch onto the skin 2 (two) times a week.  Dispense: 24 patch; Refill: 12 ?- progesterone (PROMETRIUM) 100 MG capsule; Take 1 capsule (100 mg total) by mouth in the morning and at bedtime.  Dispense: 90 capsule; Refill: 3 ? ?3. Primary insomnia ?- pt uses sparingly but does need RF. ?- zolpidem (AMBIEN) 5 MG tablet; Take 1 tablet (5 mg total) by mouth at bedtime as needed for sleep.  Dispense: 30 tablet; Refill: 0 ? ?4. RLQ abdominal mass ?- pt will return for PUS ?- US PELVIS TRANSVAGINAL NON-OB (TV ONLY); Future ? ?5. Osteopenia, unspecified location ? ? ?

## 2021-09-05 ENCOUNTER — Ambulatory Visit (INDEPENDENT_AMBULATORY_CARE_PROVIDER_SITE_OTHER): Payer: 59

## 2021-09-05 ENCOUNTER — Ambulatory Visit (HOSPITAL_BASED_OUTPATIENT_CLINIC_OR_DEPARTMENT_OTHER): Payer: 59 | Admitting: Obstetrics & Gynecology

## 2021-09-05 DIAGNOSIS — R1903 Right lower quadrant abdominal swelling, mass and lump: Secondary | ICD-10-CM | POA: Diagnosis not present

## 2021-09-06 DIAGNOSIS — M25611 Stiffness of right shoulder, not elsewhere classified: Secondary | ICD-10-CM | POA: Diagnosis not present

## 2021-09-06 DIAGNOSIS — M7541 Impingement syndrome of right shoulder: Secondary | ICD-10-CM | POA: Diagnosis not present

## 2021-09-06 DIAGNOSIS — M6281 Muscle weakness (generalized): Secondary | ICD-10-CM | POA: Diagnosis not present

## 2021-09-06 DIAGNOSIS — S46111D Strain of muscle, fascia and tendon of long head of biceps, right arm, subsequent encounter: Secondary | ICD-10-CM | POA: Diagnosis not present

## 2021-09-06 DIAGNOSIS — M25511 Pain in right shoulder: Secondary | ICD-10-CM | POA: Diagnosis not present

## 2021-09-12 DIAGNOSIS — M6281 Muscle weakness (generalized): Secondary | ICD-10-CM | POA: Diagnosis not present

## 2021-09-12 DIAGNOSIS — M25511 Pain in right shoulder: Secondary | ICD-10-CM | POA: Diagnosis not present

## 2021-09-12 DIAGNOSIS — S46111D Strain of muscle, fascia and tendon of long head of biceps, right arm, subsequent encounter: Secondary | ICD-10-CM | POA: Diagnosis not present

## 2021-09-12 DIAGNOSIS — M7541 Impingement syndrome of right shoulder: Secondary | ICD-10-CM | POA: Diagnosis not present

## 2021-09-12 DIAGNOSIS — M25611 Stiffness of right shoulder, not elsewhere classified: Secondary | ICD-10-CM | POA: Diagnosis not present

## 2021-09-17 ENCOUNTER — Ambulatory Visit
Admission: RE | Admit: 2021-09-17 | Discharge: 2021-09-17 | Disposition: A | Discharge status: Home / Self Care | Payer: 59 | Source: Ambulatory Visit | Attending: Internal Medicine | Admitting: Internal Medicine

## 2021-09-17 ENCOUNTER — Ambulatory Visit: Payer: 59

## 2021-09-17 DIAGNOSIS — Z1231 Encounter for screening mammogram for malignant neoplasm of breast: Secondary | ICD-10-CM | POA: Diagnosis not present

## 2021-09-19 DIAGNOSIS — M25611 Stiffness of right shoulder, not elsewhere classified: Secondary | ICD-10-CM | POA: Diagnosis not present

## 2021-09-19 DIAGNOSIS — M6281 Muscle weakness (generalized): Secondary | ICD-10-CM | POA: Diagnosis not present

## 2021-09-19 DIAGNOSIS — L821 Other seborrheic keratosis: Secondary | ICD-10-CM | POA: Diagnosis not present

## 2021-09-19 DIAGNOSIS — S46111D Strain of muscle, fascia and tendon of long head of biceps, right arm, subsequent encounter: Secondary | ICD-10-CM | POA: Diagnosis not present

## 2021-09-19 DIAGNOSIS — M7541 Impingement syndrome of right shoulder: Secondary | ICD-10-CM | POA: Diagnosis not present

## 2021-09-19 DIAGNOSIS — M25511 Pain in right shoulder: Secondary | ICD-10-CM | POA: Diagnosis not present

## 2021-09-25 DIAGNOSIS — M25511 Pain in right shoulder: Secondary | ICD-10-CM | POA: Diagnosis not present

## 2021-09-25 DIAGNOSIS — M6281 Muscle weakness (generalized): Secondary | ICD-10-CM | POA: Diagnosis not present

## 2021-09-25 DIAGNOSIS — M25611 Stiffness of right shoulder, not elsewhere classified: Secondary | ICD-10-CM | POA: Diagnosis not present

## 2021-09-25 DIAGNOSIS — M7541 Impingement syndrome of right shoulder: Secondary | ICD-10-CM | POA: Diagnosis not present

## 2021-09-25 DIAGNOSIS — S46111D Strain of muscle, fascia and tendon of long head of biceps, right arm, subsequent encounter: Secondary | ICD-10-CM | POA: Diagnosis not present

## 2021-10-24 DIAGNOSIS — M25511 Pain in right shoulder: Secondary | ICD-10-CM | POA: Diagnosis not present

## 2021-10-24 DIAGNOSIS — S46111D Strain of muscle, fascia and tendon of long head of biceps, right arm, subsequent encounter: Secondary | ICD-10-CM | POA: Diagnosis not present

## 2021-10-24 DIAGNOSIS — M25611 Stiffness of right shoulder, not elsewhere classified: Secondary | ICD-10-CM | POA: Diagnosis not present

## 2021-10-24 DIAGNOSIS — M6281 Muscle weakness (generalized): Secondary | ICD-10-CM | POA: Diagnosis not present

## 2021-10-24 DIAGNOSIS — M7541 Impingement syndrome of right shoulder: Secondary | ICD-10-CM | POA: Diagnosis not present

## 2021-12-05 ENCOUNTER — Other Ambulatory Visit (HOSPITAL_COMMUNITY): Payer: Self-pay

## 2021-12-05 DIAGNOSIS — Z0000 Encounter for general adult medical examination without abnormal findings: Secondary | ICD-10-CM | POA: Diagnosis not present

## 2021-12-05 DIAGNOSIS — M7521 Bicipital tendinitis, right shoulder: Secondary | ICD-10-CM | POA: Diagnosis not present

## 2021-12-05 DIAGNOSIS — E559 Vitamin D deficiency, unspecified: Secondary | ICD-10-CM | POA: Diagnosis not present

## 2021-12-05 DIAGNOSIS — R634 Abnormal weight loss: Secondary | ICD-10-CM | POA: Diagnosis not present

## 2021-12-05 DIAGNOSIS — Z86010 Personal history of colonic polyps: Secondary | ICD-10-CM | POA: Diagnosis not present

## 2021-12-05 DIAGNOSIS — Z79890 Hormone replacement therapy (postmenopausal): Secondary | ICD-10-CM | POA: Diagnosis not present

## 2021-12-05 DIAGNOSIS — Z91030 Bee allergy status: Secondary | ICD-10-CM | POA: Diagnosis not present

## 2021-12-05 DIAGNOSIS — R350 Frequency of micturition: Secondary | ICD-10-CM | POA: Diagnosis not present

## 2021-12-05 DIAGNOSIS — M85859 Other specified disorders of bone density and structure, unspecified thigh: Secondary | ICD-10-CM | POA: Diagnosis not present

## 2021-12-05 MED ORDER — EPINEPHRINE 0.3 MG/0.3ML IJ SOAJ
INTRAMUSCULAR | 0 refills | Status: AC
  Filled 2021-12-05: qty 2, 20d supply, fill #0

## 2021-12-07 ENCOUNTER — Other Ambulatory Visit (HOSPITAL_COMMUNITY): Payer: Self-pay

## 2021-12-07 MED ORDER — VITAMIN D3 1.25 MG (50000 UT) PO CAPS
1.0000 | ORAL_CAPSULE | Freq: Every day | ORAL | 1 refills | Status: DC
  Filled 2021-12-07: qty 8, 8d supply, fill #0

## 2021-12-07 MED ORDER — VITAMIN D3 1.25 MG (50000 UT) PO CAPS
1.0000 | ORAL_CAPSULE | ORAL | 1 refills | Status: DC
  Filled 2021-12-07: qty 4, 28d supply, fill #0
  Filled 2022-01-15: qty 4, 28d supply, fill #1

## 2021-12-10 ENCOUNTER — Other Ambulatory Visit (HOSPITAL_COMMUNITY): Payer: Self-pay

## 2021-12-10 MED ORDER — CELECOXIB 200 MG PO CAPS
200.0000 mg | ORAL_CAPSULE | Freq: Two times a day (BID) | ORAL | 2 refills | Status: DC
Start: 2021-12-10 — End: 2023-03-24
  Filled 2021-12-10: qty 60, 30d supply, fill #0
  Filled 2022-01-15: qty 40, 20d supply, fill #1
  Filled 2022-01-15: qty 20, 10d supply, fill #1
  Filled 2022-08-06: qty 60, 30d supply, fill #2

## 2021-12-19 ENCOUNTER — Other Ambulatory Visit: Payer: Self-pay | Admitting: Internal Medicine

## 2021-12-19 DIAGNOSIS — M85859 Other specified disorders of bone density and structure, unspecified thigh: Secondary | ICD-10-CM

## 2021-12-25 DIAGNOSIS — M25511 Pain in right shoulder: Secondary | ICD-10-CM | POA: Diagnosis not present

## 2022-01-07 DIAGNOSIS — H2513 Age-related nuclear cataract, bilateral: Secondary | ICD-10-CM | POA: Diagnosis not present

## 2022-01-07 DIAGNOSIS — H524 Presbyopia: Secondary | ICD-10-CM | POA: Diagnosis not present

## 2022-01-07 DIAGNOSIS — H5203 Hypermetropia, bilateral: Secondary | ICD-10-CM | POA: Diagnosis not present

## 2022-01-15 ENCOUNTER — Other Ambulatory Visit (HOSPITAL_COMMUNITY): Payer: Self-pay

## 2022-02-26 DIAGNOSIS — L570 Actinic keratosis: Secondary | ICD-10-CM | POA: Diagnosis not present

## 2022-02-26 DIAGNOSIS — D225 Melanocytic nevi of trunk: Secondary | ICD-10-CM | POA: Diagnosis not present

## 2022-02-26 DIAGNOSIS — L821 Other seborrheic keratosis: Secondary | ICD-10-CM | POA: Diagnosis not present

## 2022-02-26 DIAGNOSIS — L820 Inflamed seborrheic keratosis: Secondary | ICD-10-CM | POA: Diagnosis not present

## 2022-02-26 DIAGNOSIS — D1801 Hemangioma of skin and subcutaneous tissue: Secondary | ICD-10-CM | POA: Diagnosis not present

## 2022-02-26 DIAGNOSIS — D224 Melanocytic nevi of scalp and neck: Secondary | ICD-10-CM | POA: Diagnosis not present

## 2022-03-06 ENCOUNTER — Other Ambulatory Visit (HOSPITAL_COMMUNITY): Payer: Self-pay

## 2022-03-26 ENCOUNTER — Other Ambulatory Visit (HOSPITAL_COMMUNITY): Payer: Self-pay

## 2022-03-27 ENCOUNTER — Other Ambulatory Visit (HOSPITAL_BASED_OUTPATIENT_CLINIC_OR_DEPARTMENT_OTHER): Payer: Self-pay | Admitting: Obstetrics & Gynecology

## 2022-03-27 DIAGNOSIS — F5101 Primary insomnia: Secondary | ICD-10-CM

## 2022-03-29 ENCOUNTER — Other Ambulatory Visit (HOSPITAL_COMMUNITY): Payer: Self-pay

## 2022-03-29 MED ORDER — ZOLPIDEM TARTRATE 5 MG PO TABS
5.0000 mg | ORAL_TABLET | Freq: Every evening | ORAL | 0 refills | Status: DC | PRN
  Filled 2022-03-29: qty 30, 30d supply, fill #0

## 2022-04-24 ENCOUNTER — Other Ambulatory Visit: Payer: Self-pay | Admitting: *Deleted

## 2022-04-24 ENCOUNTER — Other Ambulatory Visit (HOSPITAL_COMMUNITY): Payer: Self-pay

## 2022-04-24 MED ORDER — OSELTAMIVIR PHOSPHATE 75 MG PO CAPS
75.0000 mg | ORAL_CAPSULE | Freq: Two times a day (BID) | ORAL | 1 refills | Status: DC
  Filled 2022-04-24: qty 10, 5d supply, fill #0

## 2022-06-10 ENCOUNTER — Other Ambulatory Visit: Payer: 59

## 2022-08-13 ENCOUNTER — Other Ambulatory Visit: Payer: Self-pay | Admitting: Internal Medicine

## 2022-08-13 DIAGNOSIS — M85859 Other specified disorders of bone density and structure, unspecified thigh: Secondary | ICD-10-CM

## 2022-09-05 ENCOUNTER — Encounter (HOSPITAL_BASED_OUTPATIENT_CLINIC_OR_DEPARTMENT_OTHER): Payer: Self-pay | Admitting: Obstetrics & Gynecology

## 2022-09-05 ENCOUNTER — Ambulatory Visit (INDEPENDENT_AMBULATORY_CARE_PROVIDER_SITE_OTHER): Payer: 59 | Admitting: Obstetrics & Gynecology

## 2022-09-05 ENCOUNTER — Other Ambulatory Visit (HOSPITAL_COMMUNITY): Payer: Self-pay

## 2022-09-05 VITALS — BP 106/62 | HR 73 | Ht 64.0 in | Wt 114.6 lb

## 2022-09-05 DIAGNOSIS — Z860100 Personal history of colon polyps, unspecified: Secondary | ICD-10-CM

## 2022-09-05 DIAGNOSIS — Z79890 Hormone replacement therapy (postmenopausal): Secondary | ICD-10-CM

## 2022-09-05 DIAGNOSIS — E559 Vitamin D deficiency, unspecified: Secondary | ICD-10-CM | POA: Diagnosis not present

## 2022-09-05 DIAGNOSIS — M8580 Other specified disorders of bone density and structure, unspecified site: Secondary | ICD-10-CM

## 2022-09-05 DIAGNOSIS — F5101 Primary insomnia: Secondary | ICD-10-CM

## 2022-09-05 DIAGNOSIS — Z01419 Encounter for gynecological examination (general) (routine) without abnormal findings: Secondary | ICD-10-CM | POA: Diagnosis not present

## 2022-09-05 MED ORDER — ZOLPIDEM TARTRATE 5 MG PO TABS
5.0000 mg | ORAL_TABLET | Freq: Every evening | ORAL | 0 refills | Status: DC | PRN
  Filled 2022-09-05: qty 30, 30d supply, fill #0

## 2022-09-05 MED ORDER — ESTRADIOL 0.025 MG/24HR TD PTTW
1.0000 | MEDICATED_PATCH | TRANSDERMAL | 12 refills | Status: DC
  Filled 2022-09-05: qty 24, 84d supply, fill #0
  Filled 2023-02-24: qty 24, 84d supply, fill #1
  Filled 2023-08-17: qty 24, 84d supply, fill #2

## 2022-09-05 MED ORDER — PROGESTERONE MICRONIZED 100 MG PO CAPS
ORAL_CAPSULE | ORAL | 3 refills | Status: DC
  Filled 2022-09-05: qty 180, 90d supply, fill #0
  Filled 2022-12-01: qty 180, 90d supply, fill #1
  Filled 2023-03-04: qty 180, 90d supply, fill #2
  Filled 2023-06-04: qty 180, 90d supply, fill #3

## 2022-09-05 NOTE — Progress Notes (Signed)
67 y.o. G41P0103 Married White or Caucasian female here for annual exam.  Biggest issue since I saw her last was torn biceps tendon.  Did have to have surgery and did PT afterwards.  Denies vaginal bleeding.  Having a lot of sensitivity if misses patches.  Will have trouble with sleeping.  Discussed  increasing progesterone.  Does use ambien rarely.  Needs RF.     Patient's last menstrual period was 08/12/2011 (exact date).          Sexually active: Yes.    The current method of family planning is post menopausal status.    Exercising: Yes.     Smoker:  yes  Health Maintenance: Pap:  08/2020 History of abnormal Pap:  no MMG:  09/17/2021 Colonoscopy:  2022 BMD:   2020, -1.2 Screening Labs: Dr. Clayton Bibles, last July/2023   reports that she has never smoked. She has never used smokeless tobacco. She reports current alcohol use. She reports that she does not use drugs.  Past Medical History:  Diagnosis Date   Dyspareunia    Endometriosis     Past Surgical History:  Procedure Laterality Date   BICEPT TENODESIS Right 07/04/2021   Procedure: BICEPS TENODESIS;  Surgeon: Hiram Gash, MD;  Location: [place];  Service: Orthopedics;  Laterality: Right;   [place]   OVARIAN CYST REMOVAL  1988   benign- endometrioma- laparatomy then danazol 6 months   SHOULDER ACROMIOPLASTY Right 07/04/2021   Procedure: SHOULDER ACROMIOPLASTY;  Surgeon: Hiram Gash, MD;  Location: [place];  Service: Orthopedics;  Laterality: Right;   SHOULDER ARTHROSCOPY Right 07/04/2021   Procedure: ARTHROSCOPY SHOULDER;  Surgeon: Hiram Gash, MD;  Location: [place];  Service: Orthopedics;  Laterality: Right;   SPINAL FUSION  2006   C5-6 rupture    Current Outpatient Medications  Medication Sig Dispense Refill   Calcium Carbonate-Vitamin D (CALCIUM 500 + D PO) Take 1 tablet by mouth daily.     celecoxib (CELEBREX) 200 MG capsule Take 1  capsule (200 mg total) by mouth 2 (two) times daily. 60 capsule 2   EPINEPHrine (EPIPEN 2-PAK) 0.3 mg/0.3 mL IJ SOAJ injection Inject once as needed as directed 2 each 0   EPINEPHrine 0.3 mg/0.3 mL IJ SOAJ injection SMARTSIG:0.3 Milliliter(s) IM Once PRN     estradiol (VIVELLE-DOT) 0.025 MG/24HR Place 1 patch onto the skin 2 (two) times a week. 24 patch 12   progesterone (PROMETRIUM) 100 MG capsule Take 1 capsule (100 mg total) by mouth in the morning and at bedtime. 90 capsule 3   zolpidem (AMBIEN) 5 MG tablet Take 1 tablet (5 mg total) by mouth at bedtime as needed for sleep. 30 tablet 0   No current facility-administered medications for this visit.    Family History  Problem Relation Age of Onset   Diabetes Maternal Grandfather    Heart disease Maternal Grandfather    Polymyalgia rheumatica Father    Renal Disease Father    Hypertension Father    Stroke Father        with memory loss   Heart disease Paternal Grandfather    Hypertension Mother    Hyperlipidemia Mother    Stroke Mother     ROS: Constitutional: negative Genitourinary:negative  Exam:   BP (!) 99/50 (BP Location: Left Arm, Patient Position: Sitting, Cuff Size: Normal)   Pulse 73   Ht 5\' 4"  (1.626 m) Comment: Reported  Wt 114 lb 9.6 oz (52 kg)   LMP 08/12/2011 (Exact Date)   BMI 19.67 kg/m   Height: 5\' 4"  (162.6 cm) (Reported)  General appearance: alert, cooperative and appears stated age Head: Normocephalic, without obvious abnormality, atraumatic Neck: no adenopathy, supple, symmetrical, trachea midline and thyroid normal to inspection and palpation Lungs: clear to auscultation bilaterally Breasts: normal appearance, no masses or tenderness Heart: regular rate and rhythm Abdomen: soft, non-tender; bowel sounds normal; no masses,  no organomegaly Extremities: extremities normal, atraumatic, no cyanosis or edema Skin: Skin color, texture, turgor normal. No rashes or lesions Lymph nodes: Cervical,  supraclavicular, and axillary nodes normal. No abnormal inguinal nodes palpated Neurologic: Grossly normal   Pelvic: External genitalia:  no lesions              Urethra:  normal appearing urethra with no masses, tenderness or lesions              Bartholins and Skenes: normal                 Vagina: normal appearing vagina with normal color and no discharge, no lesions              Cervix: no lesions              Pap taken: No. Bimanual Exam:  Uterus:  normal size, contour, position, consistency, mobility, non-tender              Adnexa: normal adnexa and no mass, fullness, tenderness               Rectovaginal: Confirms               Anus:  normal sphincter tone, no lesions  Chaperone, Ina Homes, CMA, was present for exam.  Assessment/Plan: 1. Well woman exam with routine gynecological exam - Pap smear neg 08/2020.  Not indicated today. - Mammogram 09/17/2021.  Pt aware due soon. - Colonoscopy 2022. - Bone mineral density 2020 with worse T score -1.2.  Follow up recommended next year. - lab work done with PCP, Dr. Chales Salmon - vaccines reviewed/updated  2. Osteopenia, unspecified location - BMD is scheduled  3. Hormone replacement therapy (HRT) - will try increase dosage of progesterone - estradiol (VIVELLE-DOT) 0.025 MG/24HR; Place 1 patch onto the skin 2 (two) times a week.  Dispense: 24 patch; Refill: 12 - progesterone (PROMETRIUM) 100 MG capsule; Take 2 capsules nightly.  Dispense: 180 capsule; Refill: 3  4. Primary insomnia - zolpidem (AMBIEN) 5 MG tablet; Take 1 tablet (5 mg total) by mouth at bedtime as needed for sleep.  Dispense: 30 tablet; Refill: 0  5. Personal history of colonic polyps  6. Vitamin D deficiency - followed by Dr. Seth Bake

## 2022-09-13 ENCOUNTER — Other Ambulatory Visit (HOSPITAL_COMMUNITY): Payer: Self-pay

## 2022-09-13 DIAGNOSIS — M25511 Pain in right shoulder: Secondary | ICD-10-CM | POA: Diagnosis not present

## 2022-09-13 MED ORDER — METHYLPREDNISOLONE 4 MG PO TBPK
ORAL_TABLET | ORAL | 0 refills | Status: AC
  Filled 2022-09-13: qty 21, 6d supply, fill #0

## 2022-09-19 IMAGING — MR MR CERVICAL SPINE W/O CM
4 of 5 series · 27 of 48 positions shown · non-contrast
Comparison: 08/24/2012

CLINICAL DATA: Neck pain, prior cervical surgery, left neck, muscle
tightness

EXAM:
MRI CERVICAL SPINE WITHOUT CONTRAST
TECHNIQUE: Multiplanar, multisequence MR imaging of the cervical spine was
performed. No intravenous contrast was administered.

[Series 5: T2 · sagittal · 3.0mm · 0.55mm/px · 6 of 15 slices shown (1 of 2)]
[im 1/15]
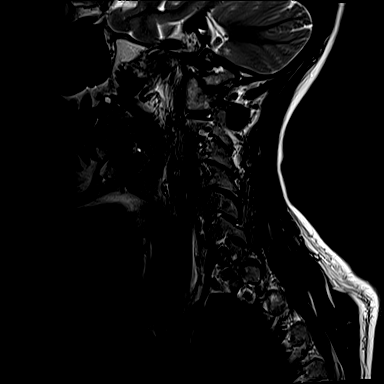
[im 3/15]
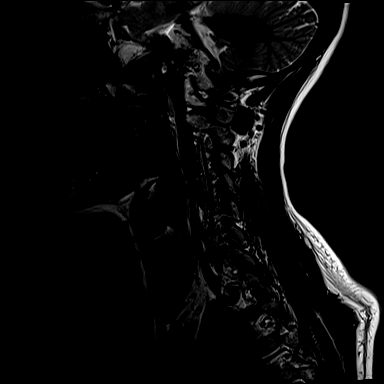
[im 6/15]
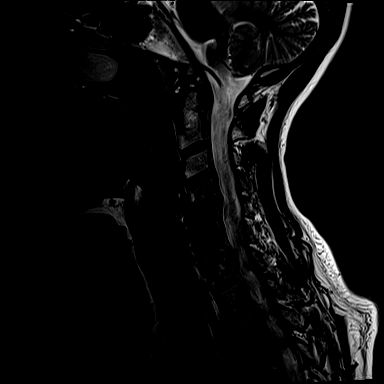
[im 9/15]
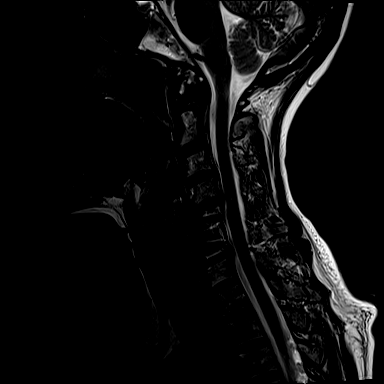
[im 12/15]
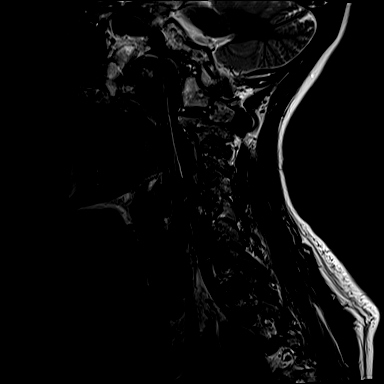
[im 15/15]
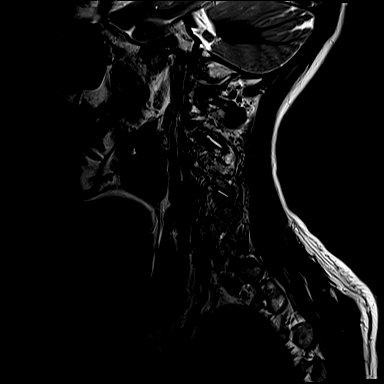

[Series 6: T1 · sagittal · 3.0mm · 0.66mm/px · 7 of 15 slices shown]
[im 1/15]
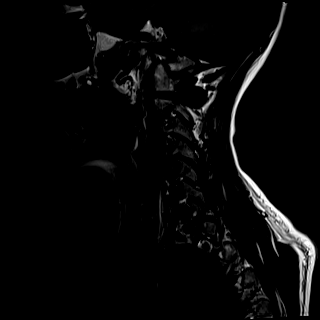
[im 3/15]
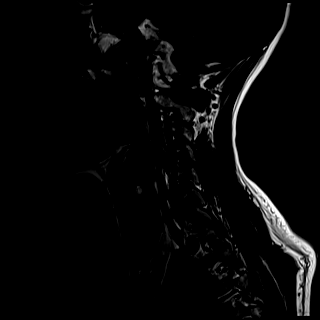
[im 5/15]
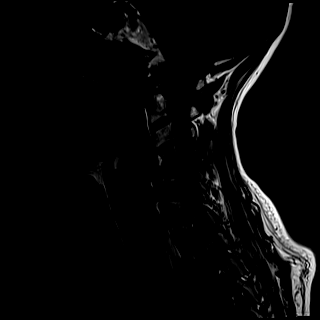
[im 8/15]
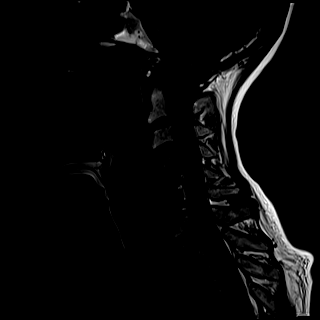
[im 10/15]
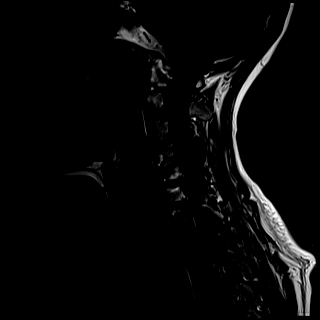
[im 12/15]
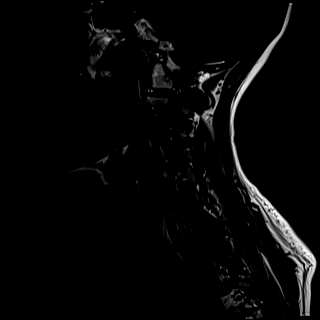
[im 15/15]
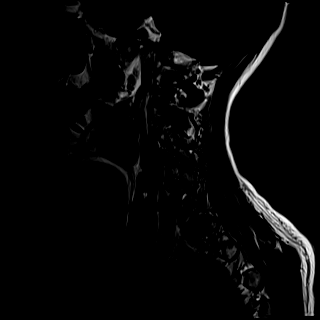

[Series 7: STIR · sagittal · 3.0mm · 0.33mm/px · 6 of 15 slices shown]
[im 1/15]
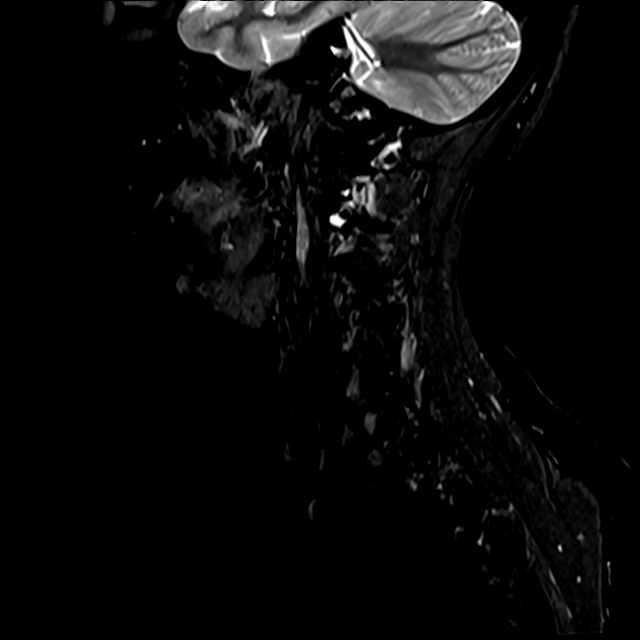
[im 3/15]
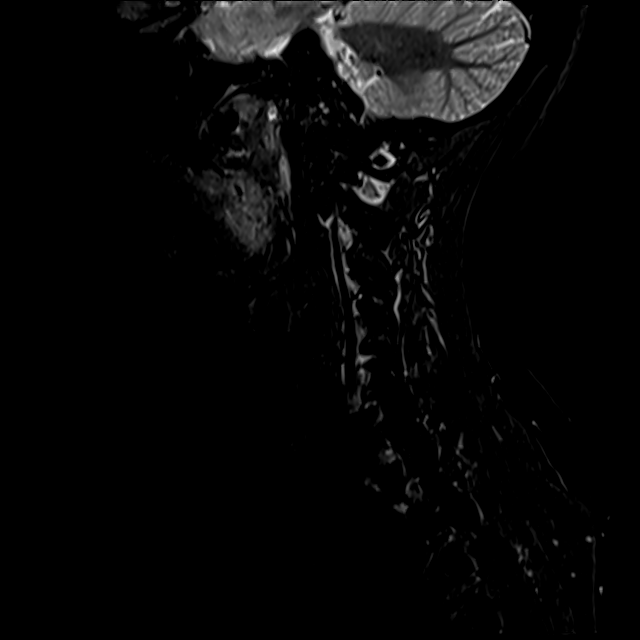
[im 5/15]
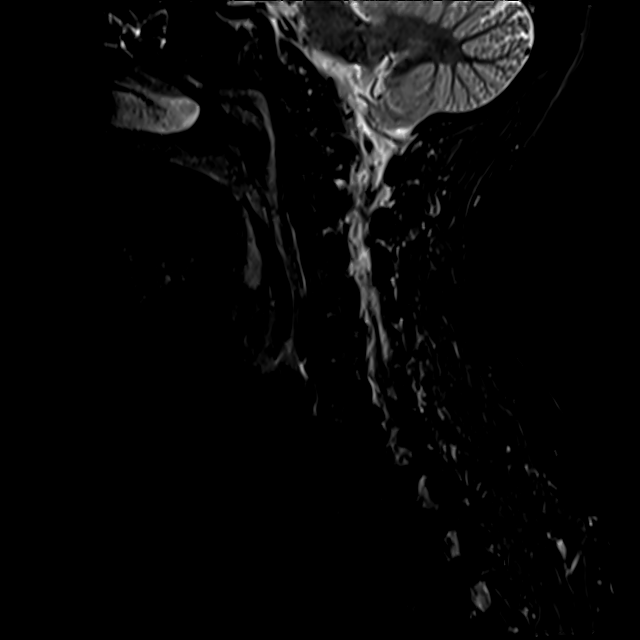
[im 8/15]
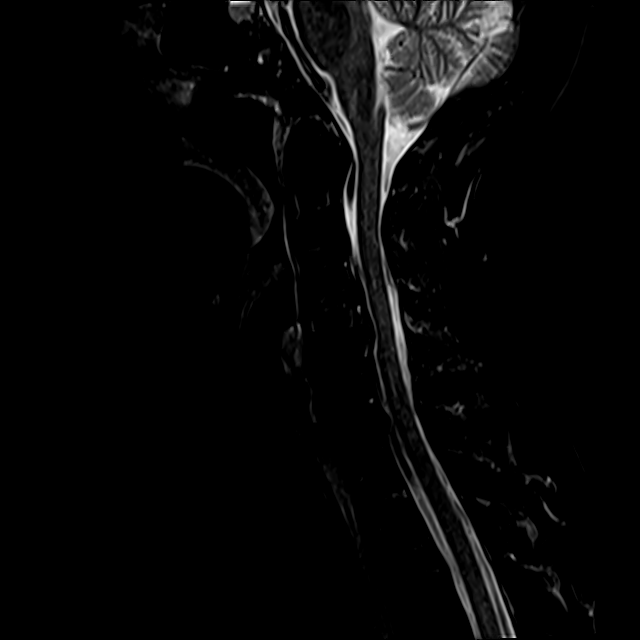
[im 10/15]
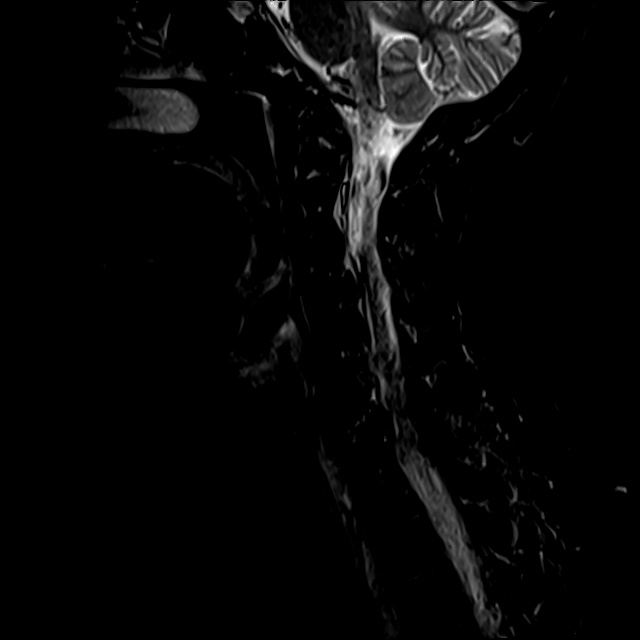
[im 12/15]
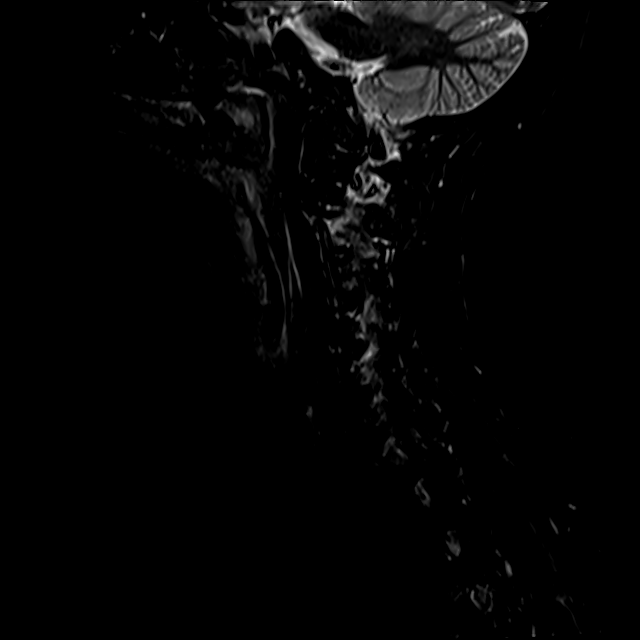

[Series 8: T2 · axial · 3.0mm · 0.50mm/px · z∈[-96,-6]mm · 8 of 30 slices shown (2 of 2)]
[im 1/30]
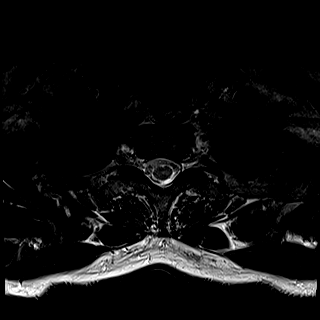
[im 5/30]
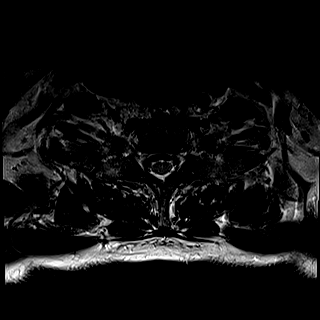
[im 9/30]
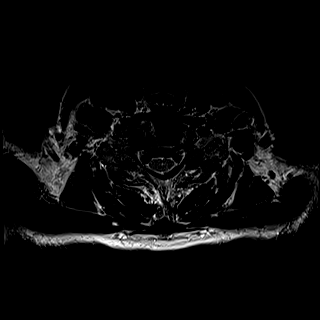
[im 14/30]
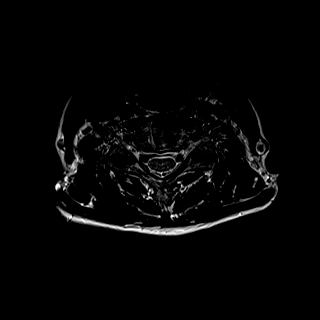
[im 16/30]
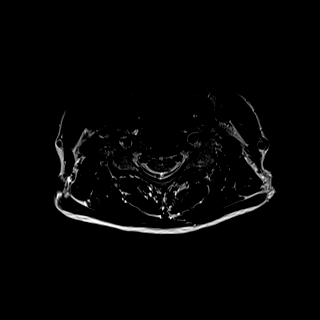
[im 21/30]
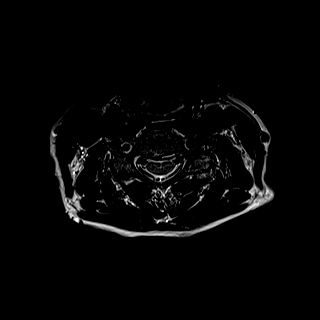
[im 25/30]
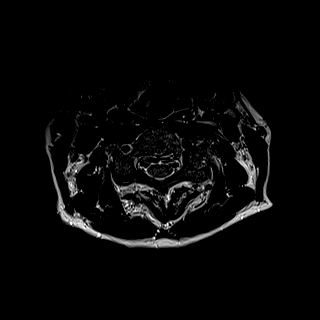
[im 30/30]
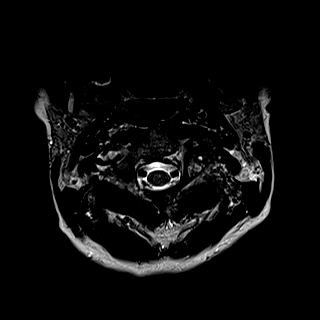

[27 of 48 positions shown; findings below may reference images not displayed]

FINDINGS: Alignment: Straightening and mild reversal of the normal cervical
lordosis.

Vertebrae: Status post C5-C6 ACDF with apparent osseous fusion
across the disc space. No acute fracture or suspicious osseous
lesion.

Cord: Normal signal and morphology.

Posterior Fossa, vertebral arteries, paraspinal tissues: Negative.

Disc levels:

C2-C3: Small central disc protrusion. Facet arthropathy with
widening of the left facets, which is new from the prior exam. No
spinal canal stenosis or neuroforaminal narrowing.

C3-C4: Small central disc protrusion. Mild facet arthropathy. No
spinal canal stenosis or neural foraminal narrowing.

C4-C5: Mild disc bulge with superimposed central protrusion, which
indents the ventral thecal sac. Mild spinal canal stenosis,
unchanged. Uncovertebral and facet arthropathy. Mild bilateral
neural foraminal narrowing, which is new on the right.

C5-C6: Status post ACDF. No spinal canal stenosis or neural
foraminal narrowing.

C6-C7: Mild disc bulge. Ligamentum flavum hypertrophy.
Mild-to-moderate spinal canal stenosis, which has progressed from
the prior exam. Uncovertebral and facet arthropathy. Severe right
and moderate left neural foraminal narrowing, which appears to have
progressed on the right.

C7-T1: No significant disc bulge. No spinal canal stenosis or
neuroforaminal narrowing.
IMPRESSION: 1. Status post ACDF C5-C6, without evidence of complication.
2. C6-C7 mild-to-moderate spinal canal stenosis, with severe right
and moderate left neural foraminal narrowing.
3. C4-C5 mild spinal canal stenosis with mild bilateral neural
foraminal narrowing.
4. Widening of the left facets at C2-C3, which can be seen with
instability.

## 2022-10-29 ENCOUNTER — Ambulatory Visit
Admission: RE | Admit: 2022-10-29 | Discharge: 2022-10-29 | Disposition: A | Discharge status: Home / Self Care | Payer: 59 | Source: Ambulatory Visit | Attending: Internal Medicine | Admitting: Internal Medicine

## 2022-10-29 DIAGNOSIS — M85859 Other specified disorders of bone density and structure, unspecified thigh: Secondary | ICD-10-CM

## 2022-10-29 DIAGNOSIS — Z1231 Encounter for screening mammogram for malignant neoplasm of breast: Secondary | ICD-10-CM | POA: Diagnosis not present

## 2022-10-29 DIAGNOSIS — E349 Endocrine disorder, unspecified: Secondary | ICD-10-CM | POA: Diagnosis not present

## 2022-10-29 DIAGNOSIS — N951 Menopausal and female climacteric states: Secondary | ICD-10-CM | POA: Diagnosis not present

## 2022-12-10 ENCOUNTER — Other Ambulatory Visit (HOSPITAL_COMMUNITY): Payer: Self-pay

## 2022-12-10 DIAGNOSIS — Z86010 Personal history of colonic polyps: Secondary | ICD-10-CM | POA: Diagnosis not present

## 2022-12-10 DIAGNOSIS — Z0000 Encounter for general adult medical examination without abnormal findings: Secondary | ICD-10-CM | POA: Diagnosis not present

## 2022-12-10 DIAGNOSIS — Z1211 Encounter for screening for malignant neoplasm of colon: Secondary | ICD-10-CM | POA: Diagnosis not present

## 2022-12-10 DIAGNOSIS — Z91030 Bee allergy status: Secondary | ICD-10-CM | POA: Diagnosis not present

## 2022-12-10 DIAGNOSIS — E559 Vitamin D deficiency, unspecified: Secondary | ICD-10-CM | POA: Diagnosis not present

## 2022-12-10 DIAGNOSIS — Z1231 Encounter for screening mammogram for malignant neoplasm of breast: Secondary | ICD-10-CM | POA: Diagnosis not present

## 2022-12-10 DIAGNOSIS — Z79890 Hormone replacement therapy (postmenopausal): Secondary | ICD-10-CM | POA: Diagnosis not present

## 2022-12-10 DIAGNOSIS — M85859 Other specified disorders of bone density and structure, unspecified thigh: Secondary | ICD-10-CM | POA: Diagnosis not present

## 2022-12-10 MED ORDER — EPINEPHRINE 0.3 MG/0.3ML IJ SOAJ
0.3000 mg | INTRAMUSCULAR | 1 refills | Status: AC | PRN
  Filled 2022-12-10: qty 2, 10d supply, fill #0
  Filled 2023-12-06: qty 2, 10d supply, fill #1

## 2022-12-11 ENCOUNTER — Other Ambulatory Visit (HOSPITAL_COMMUNITY): Payer: Self-pay

## 2022-12-11 MED ORDER — ERGOCALCIFEROL 1.25 MG (50000 UT) PO CAPS
50000.0000 [IU] | ORAL_CAPSULE | ORAL | 1 refills | Status: DC
  Filled 2022-12-11: qty 4, 28d supply, fill #0
  Filled 2023-01-07: qty 4, 28d supply, fill #1

## 2023-01-13 DIAGNOSIS — H52222 Regular astigmatism, left eye: Secondary | ICD-10-CM | POA: Diagnosis not present

## 2023-01-13 DIAGNOSIS — H2513 Age-related nuclear cataract, bilateral: Secondary | ICD-10-CM | POA: Diagnosis not present

## 2023-01-13 DIAGNOSIS — H524 Presbyopia: Secondary | ICD-10-CM | POA: Diagnosis not present

## 2023-01-13 DIAGNOSIS — H5203 Hypermetropia, bilateral: Secondary | ICD-10-CM | POA: Diagnosis not present

## 2023-02-27 DIAGNOSIS — D225 Melanocytic nevi of trunk: Secondary | ICD-10-CM | POA: Diagnosis not present

## 2023-02-27 DIAGNOSIS — D2262 Melanocytic nevi of left upper limb, including shoulder: Secondary | ICD-10-CM | POA: Diagnosis not present

## 2023-02-27 DIAGNOSIS — D224 Melanocytic nevi of scalp and neck: Secondary | ICD-10-CM | POA: Diagnosis not present

## 2023-02-27 DIAGNOSIS — D2261 Melanocytic nevi of right upper limb, including shoulder: Secondary | ICD-10-CM | POA: Diagnosis not present

## 2023-02-27 DIAGNOSIS — L821 Other seborrheic keratosis: Secondary | ICD-10-CM | POA: Diagnosis not present

## 2023-03-24 ENCOUNTER — Ambulatory Visit (INDEPENDENT_AMBULATORY_CARE_PROVIDER_SITE_OTHER): Payer: 59 | Admitting: Family Medicine

## 2023-03-24 ENCOUNTER — Encounter: Payer: Self-pay | Admitting: Family Medicine

## 2023-03-24 ENCOUNTER — Other Ambulatory Visit (HOSPITAL_COMMUNITY): Payer: Self-pay

## 2023-03-24 VITALS — BP 104/68 | Ht 64.0 in | Wt 115.0 lb

## 2023-03-24 DIAGNOSIS — M5431 Sciatica, right side: Secondary | ICD-10-CM | POA: Diagnosis not present

## 2023-03-24 MED ORDER — CELECOXIB 200 MG PO CAPS
200.0000 mg | ORAL_CAPSULE | Freq: Two times a day (BID) | ORAL | 0 refills | Status: DC
  Filled 2023-03-24: qty 60, 30d supply, fill #0

## 2023-03-24 MED ORDER — PREDNISONE 10 MG (21) PO TBPK
ORAL_TABLET | ORAL | 0 refills | Status: AC
  Filled 2023-03-24: qty 21, 6d supply, fill #0

## 2023-03-24 NOTE — Progress Notes (Signed)
DATE OF VISIT: 03/24/2023        Rachel Beasley DOB: Aug 22, 1955 MRN: [id number]  CC:  RT leg pain  History- Rachel Beasley is a 67 y.o.  female for evaluation and treatment of right leg pain Pain in the right leg and right knee x several months Tightness in the glutes radiating into the hamstrings Some radiation to the calf Saw PT and thought was sciatica - has done 2 PT sessions for this -- dry needling and massage  Some pain along lateral knee x 3 weeks Stiff is sitting for long periods  Tried Ibuprofen 400-600mg  every day-bid prn Tried Celebrex prn - helps more than ibuprofen Tried heat and ice (mostly ice) Plays tennis - started after playing tennis -- has not played in a few weeks - doing boot camp 2-3 mornings/wk - walking several days a week - tries to stretch after activity (+) night pain - sleeps with towel or pillow under knees Denies lower extremity weakness Denies change in bowel or bladder  Of note, patient's daughter is getting married in 2weeks here in [place]   Past Medical History Past Medical History:  Diagnosis Date   Dyspareunia    Endometriosis     Past Surgical History Past Surgical History:  Procedure Laterality Date   BICEPT TENODESIS Right 07/04/2021   Procedure: BICEPS TENODESIS;  Surgeon: Bjorn Pippin, MD;  Location: [facility];  Service: Orthopedics;  Laterality: Right;   CESAREAN SECTION  1991   LAPAROSCOPY  1989   OVARIAN CYST REMOVAL  1988   benign- endometrioma- laparatomy then danazol 6 months   SHOULDER ACROMIOPLASTY Right 07/04/2021   Procedure: SHOULDER ACROMIOPLASTY;  Surgeon: Bjorn Pippin, MD;  Location: [facility];  Service: Orthopedics;  Laterality: Right;   SHOULDER ARTHROSCOPY Right 07/04/2021   Procedure: ARTHROSCOPY SHOULDER;  Surgeon: Bjorn Pippin, MD;  Location: [facility];  Service: Orthopedics;  Laterality: Right;   SPINAL FUSION  2006   C5-6 rupture    Medications Current  Outpatient Medications  Medication Sig Dispense Refill   predniSONE (DELTASONE) 10 MG tablet Use as directed per doctors orders for the next 6 days. 21 tablet 0   Calcium Carbonate-Vitamin D (CALCIUM 500 + D PO) Take 1 tablet by mouth daily.     celecoxib (CELEBREX) 200 MG capsule Take 1 capsule (200 mg total) by mouth 2 (two) times daily. 60 capsule 0   EPINEPHrine (EPIPEN 2-PAK) 0.3 mg/0.3 mL IJ SOAJ injection Inject once as needed as directed 2 each 0   EPINEPHrine (EPIPEN 2-PAK) 0.3 mg/0.3 mL IJ SOAJ injection Use as directed as needed for anaphylaxis. 2 each 1   EPINEPHrine 0.3 mg/0.3 mL IJ SOAJ injection SMARTSIG:0.3 Milliliter(s) IM Once PRN     ergocalciferol (DRISDOL) 1.25 MG (50000 UT) capsule Take 1 capsule (50,000 Units total) by mouth once a week with dinner 4 capsule 1   estradiol (VIVELLE-DOT) 0.025 MG/24HR Place 1 patch onto the skin 2 (two) times a week. 24 patch 12   progesterone (PROMETRIUM) 100 MG capsule Take 2 capsules nightly. 180 capsule 3   zolpidem (AMBIEN) 5 MG tablet Take 1 tablet (5 mg total) by mouth at bedtime as needed for sleep. 30 tablet 0   No current facility-administered medications for this visit.    Allergies has No Known Allergies.  Family History - reviewed per EMR and intake form  Social History   reports current alcohol use.  reports that she has  never smoked. She has never used smokeless tobacco.  reports no history of drug use. OCCUPATION: former nurse   EXAM: Vitals: BP 104/68   Ht 5\' 4"  (1.626 m)   Wt 115 lb (52.2 kg)   LMP 08/12/2011 (Exact Date)   BMI 19.74 kg/m  General: AOx3, NAD, pleasant SKIN: no rashes or lesions, skin clean, dry, intact MSK: Lumbar spine with full range of motion with mild pain at terminal flexion.  She has no midline or paraspinal tenderness.  She has mild tenderness and tightness in the right piriformis.  Negative straight leg raise bilaterally. Hips: Bilateral hips with full range of motion without pain.   Negative FABER and FADIR bilaterally.  No tenderness over the greater trochanter or anterior hips bilaterally.  Hip strength 5/5 throughout bilaterally. Knee: Right knee with full range of motion without pain.  No swelling or effusion.  No medial or lateral joint line tenderness.  Negative McMurray, negative Lachman, negative varus/valgus stress.  Left knee with full range of motion pain weakness or instability. Gait: Able to toe walk and heel walk.  No leg length discrepancy.  Walking without a limp.  NEURO: sensation intact to light touch, DTR + 2/4 Achilles and patella bilaterally VASC: pulses 2+ and symmetric DP/PT bilaterally, no edema  Assessment & Plan Sciatica, right side Acute right leg pain consistent with sciatica  Plan: 1.  Diagnosis and treatment discussed 2.  Rx prednisone 10 mg 6-day taper to take as directed.  Explained how to take medication and common side effects.  Should take with food. 3.  Also given a refill of Celebrex 200 mg 1 tab p.o. twice daily with food as needed.  Should not take while taking the prednisone. 4.  Referral to physical therapy was provided 5.  Can continue with activity as her symptoms allow. 6.  Can continue to use heat or ice as needed.  Should also continue to stretch after activity. 7.  She will follow-up with me in 6 to 8 weeks for reevaluation, sooner as needed.  She encouraged to reach out if symptoms are worsening or not improving 8.  Patient expressed understanding agreement with above.  All questions were answered    Encounter Diagnosis  Name Primary?   Sciatica, right side Yes    Orders Placed This Encounter  Procedures   Ambulatory referral to Physical Therapy    Orders Placed This Encounter  Procedures   Ambulatory referral to Physical Therapy

## 2023-03-25 NOTE — Therapy (Incomplete)
OUTPATIENT PHYSICAL THERAPY THORACOLUMBAR EVALUATION   Patient Name: Rachel Beasley MRN: [id number] DOB:[birth year], 67 y.o., female Today's Date: 03/25/2023  END OF SESSION:   Past Medical History:  Diagnosis Date   Dyspareunia    Endometriosis    Past Surgical History:  Procedure Laterality Date   BICEPT TENODESIS Right 07/04/2021   Procedure: BICEPS TENODESIS;  Surgeon: Bjorn Pippin, MD;  Location: [facility];  Service: Orthopedics;  Laterality: Right;   CESAREAN SECTION  1991   LAPAROSCOPY  1989   OVARIAN CYST REMOVAL  1988   benign- endometrioma- laparatomy then danazol 6 months   SHOULDER ACROMIOPLASTY Right 07/04/2021   Procedure: SHOULDER ACROMIOPLASTY;  Surgeon: Bjorn Pippin, MD;  Location: [facility];  Service: Orthopedics;  Laterality: Right;   SHOULDER ARTHROSCOPY Right 07/04/2021   Procedure: ARTHROSCOPY SHOULDER;  Surgeon: Bjorn Pippin, MD;  Location: [facility];  Service: Orthopedics;  Laterality: Right;   SPINAL FUSION  2006   C5-6 rupture   Patient Active Problem List   Diagnosis Date Noted   Osteopenia 08/29/2020   Primary insomnia 08/29/2020   History of colonic polyps 08/28/2020   Vitamin D deficiency 08/28/2020   Hormone replacement therapy (HRT) 02/13/2017   Right foot pain 02/22/2013   Lateral epicondylitis 12/26/2009    PCP: ***  REFERRING PROVIDER: ***  REFERRING DIAG: M54.31 (ICD-10-CM) - Sciatica, right side   Rationale for Evaluation and Treatment: Rehabilitation  THERAPY DIAG:  No diagnosis found.  ONSET DATE: ***  SUBJECTIVE:                                                                                                                                                                                           SUBJECTIVE STATEMENT: ***  PERTINENT HISTORY:  ***  PAIN:  Are you having pain? {OPRCPAIN:27236}  PRECAUTIONS: {Therapy precautions:24002}  RED FLAGS: {PT Red  Flags:29287}   WEIGHT BEARING RESTRICTIONS: {Yes ***/No:24003}  FALLS:  Has patient fallen in last 6 months? {fallsyesno:27318}  LIVING ENVIRONMENT: Lives with: {[facility] lives with:25569::"lives with their family"} Lives in: {Lives in:25570} Stairs: {opstairs:27293} Has following equipment at home: {Assistive devices:23999}  OCCUPATION: ***  PLOF: {PLOF:24004}  PATIENT GOALS: ***  NEXT MD VISIT: ***  OBJECTIVE:  Note: Objective measures were completed at Evaluation unless otherwise noted.  DIAGNOSTIC FINDINGS:  ***  PATIENT SURVEYS:  {rehab surveys:24030}  SCREENING FOR RED FLAGS: Bowel or bladder incontinence: {Yes/No:304960894} Spinal tumors: {Yes/No:304960894} Cauda equina syndrome: {Yes/No:304960894} Compression fracture: {Yes/No:304960894} Abdominal aneurysm: {Yes/No:304960894}  COGNITION: Overall cognitive status: {cognition:24006}     SENSATION: {sensation:27233}  MUSCLE LENGTH: Hamstrings: Right *** deg;  Left *** deg Maisie Fus test: Right *** deg; Left *** deg  POSTURE: {posture:25561}  PALPATION: ***  LUMBAR ROM:   AROM eval  Flexion   Extension   Right lateral flexion   Left lateral flexion   Right rotation   Left rotation    (Blank rows = not tested)  LOWER EXTREMITY ROM:     {AROM/PROM:27142}  Right eval Left eval  Hip flexion    Hip extension    Hip abduction    Hip adduction    Hip internal rotation    Hip external rotation    Knee flexion    Knee extension    Ankle dorsiflexion    Ankle plantarflexion    Ankle inversion    Ankle eversion     (Blank rows = not tested)  LOWER EXTREMITY MMT:    MMT Right eval Left eval  Hip flexion    Hip extension    Hip abduction    Hip adduction    Hip internal rotation    Hip external rotation    Knee flexion    Knee extension    Ankle dorsiflexion    Ankle plantarflexion    Ankle inversion    Ankle eversion     (Blank rows = not tested)  LUMBAR SPECIAL TESTS:  {lumbar  special test:25242}  FUNCTIONAL TESTS:  {Functional tests:24029}  GAIT: Distance walked: *** Assistive device utilized: {Assistive devices:23999} Level of assistance: {Levels of assistance:24026} Comments: ***  TODAY'S TREATMENT:                                                                                                                              DATE: ***    PATIENT EDUCATION:  Education details: *** Person educated: {Person educated:25204} Education method: {Education Method:25205} Education comprehension: {Education Comprehension:25206}  HOME EXERCISE PROGRAM: ***  ASSESSMENT:  CLINICAL IMPRESSION: Patient is a *** y.o. *** who was seen today for physical therapy evaluation and treatment for ***.   OBJECTIVE IMPAIRMENTS: {opptimpairments:25111}.   ACTIVITY LIMITATIONS: {activitylimitations:27494}  PARTICIPATION LIMITATIONS: {participationrestrictions:25113}  PERSONAL FACTORS: {Personal factors:25162} are also affecting patient's functional outcome.   REHAB POTENTIAL: {rehabpotential:25112}  CLINICAL DECISION MAKING: {clinical decision making:25114}  EVALUATION COMPLEXITY: {Evaluation complexity:25115}   GOALS: Goals reviewed with patient? {yes/no:20286}  SHORT TERM GOALS: Target date: ***  *** Baseline: Goal status: INITIAL  2.  *** Baseline:  Goal status: INITIAL  3.  *** Baseline:  Goal status: INITIAL  4.  *** Baseline:  Goal status: INITIAL  5.  *** Baseline:  Goal status: INITIAL  6.  *** Baseline:  Goal status: INITIAL  LONG TERM GOALS: Target date: ***  *** Baseline:  Goal status: INITIAL  2.  *** Baseline:  Goal status: INITIAL  3.  *** Baseline:  Goal status: INITIAL  4.  *** Baseline:  Goal status: INITIAL  5.  *** Baseline:  Goal status: INITIAL  6.  *** Baseline:  Goal status: INITIAL  PLAN:  PT FREQUENCY: {rehab frequency:25116}  PT DURATION: {rehab duration:25117}  PLANNED INTERVENTIONS:  {rehab planned interventions:25118::"97110-Therapeutic exercises","97530- Therapeutic [phone]- Neuromuscular re-education","97535- Self NGEX","52841- Manual therapy"}.  PLAN FOR NEXT SESSION: Jenelle Mages, PT 03/25/2023, 4:35 PM

## 2023-03-27 ENCOUNTER — Ambulatory Visit: Payer: 59 | Attending: Family Medicine

## 2023-03-27 ENCOUNTER — Other Ambulatory Visit: Payer: Self-pay

## 2023-03-27 DIAGNOSIS — M5431 Sciatica, right side: Secondary | ICD-10-CM | POA: Insufficient documentation

## 2023-03-27 DIAGNOSIS — M5459 Other low back pain: Secondary | ICD-10-CM | POA: Diagnosis not present

## 2023-03-27 DIAGNOSIS — M6281 Muscle weakness (generalized): Secondary | ICD-10-CM | POA: Diagnosis not present

## 2023-03-27 DIAGNOSIS — M25651 Stiffness of right hip, not elsewhere classified: Secondary | ICD-10-CM | POA: Diagnosis not present

## 2023-03-27 NOTE — Therapy (Signed)
OUTPATIENT PHYSICAL THERAPY THORACOLUMBAR EVALUATION   Patient Name: Rachel Beasley MRN: [id number] DOB:02/03/1956, 67 y.o., female Today's Date: 03/27/2023  END OF SESSION:  PT End of Session - 03/27/23 1325     Visit Number 1    Number of Visits 9    Date for PT Re-Evaluation 05/22/23    Authorization Type [employer]    PT Start Time 1102    PT Stop Time 1142    PT Time Calculation (min) 40 min    Activity Tolerance Patient tolerated treatment well    Behavior During Therapy [facility] for tasks assessed/performed             Past Medical History:  Diagnosis Date   Dyspareunia    Endometriosis    Past Surgical History:  Procedure Laterality Date   BICEPT TENODESIS Right 07/04/2021   Procedure: BICEPS TENODESIS;  Surgeon: Bjorn Pippin, MD;  Location: [facility];  Service: Orthopedics;  Laterality: Right;   CESAREAN SECTION  1991   LAPAROSCOPY  1989   OVARIAN CYST REMOVAL  1988   benign- endometrioma- laparatomy then danazol 6 months   SHOULDER ACROMIOPLASTY Right 07/04/2021   Procedure: SHOULDER ACROMIOPLASTY;  Surgeon: Bjorn Pippin, MD;  Location: [facility];  Service: Orthopedics;  Laterality: Right;   SHOULDER ARTHROSCOPY Right 07/04/2021   Procedure: ARTHROSCOPY SHOULDER;  Surgeon: Bjorn Pippin, MD;  Location: [facility];  Service: Orthopedics;  Laterality: Right;   SPINAL FUSION  2006   C5-6 rupture   Patient Active Problem List   Diagnosis Date Noted   Osteopenia 08/29/2020   Primary insomnia 08/29/2020   History of colonic polyps 08/28/2020   Vitamin D deficiency 08/28/2020   Hormone replacement therapy (HRT) 02/13/2017   Right foot pain 02/22/2013   Lateral epicondylitis 12/26/2009    PCP: Lorenda Ishihara, MD  REFERRING PROVIDER: Andi Devon, DO    REFERRING DIAG: M54.31 (ICD-10-CM) - Sciatica, right side   Rationale for Evaluation and Treatment: Rehabilitation  THERAPY DIAG:  Stiffness  of right hip, not elsewhere classified - Plan: PT plan of care cert/re-cert  Muscle weakness (generalized) - Plan: PT plan of care cert/re-cert  Other low back pain - Plan: PT plan of care cert/re-cert  ONSET DATE: Chronic  SUBJECTIVE:                                                                                                                                                                                           SUBJECTIVE STATEMENT: Pt presents to PT with reports of subacute posterior R hip pain and paresthesias that extend  to lateral R knee. Denies overt LBP, notes pain is not constant and is alleviated with movement and exercise. Is very active doing boot camp exercise 2-3x/wk and was regularly playing tennis until recent onset of R LE pain. Denies bowel/bladder changes or saddle anesthesia.   PERTINENT HISTORY:  C5-6 spinal fusion  PAIN:  Are you having pain?  Yes: NPRS scale: 1/10 Worst: 3/10 Pain location: Posterior R LE Pain description: sharp Aggravating factors: prolonged sitting Relieving factors: movement, exercise   PRECAUTIONS: None  RED FLAGS: None   WEIGHT BEARING RESTRICTIONS: No  FALLS:  Has patient fallen in last 6 months? No  LIVING ENVIRONMENT: Lives with: lives with their family Lives in: House/apartment Stairs: No issue Has following equipment at home: None  OCCUPATION: Not currently working  PLOF: Independent  PATIENT GOALS: decrease pain/paresthesias in R LE, get back to playing tennis  NEXT MD VISIT: PRN  OBJECTIVE:  Note: Objective measures were completed at Evaluation unless otherwise noted.  DIAGNOSTIC FINDINGS:  N/A  PATIENT SURVEYS:  FOTO: 47% function; 66% predicted  COGNITION: Overall cognitive status: Within functional limits for tasks assessed     SENSATION: WFL  MUSCLE LENGTH: Hamstrings: Right WFL deg; Left WFL deg Thomas test: Right (-) deg; Left (-) deg  POSTURE: No Significant postural  limitations  PALPATION: TTP to R piriformis (in FAIR test position)  LUMBAR ROM:   AROM eval  Flexion WFL  Extension WFL  Right lateral flexion   Left lateral flexion   Right rotation   Left rotation    (Blank rows = not tested)  LOWER EXTREMITY ROM:     Active  Right eval Left eval  Hip flexion    Hip extension    Hip abduction    Hip adduction    Hip internal rotation 18 36  Hip external rotation    Knee flexion    Knee extension    Ankle dorsiflexion    Ankle plantarflexion    Ankle inversion    Ankle eversion     (Blank rows = not tested)  LOWER EXTREMITY MMT:    MMT Right eval Left eval  Hip flexion    Hip extension    Hip abduction 4/5 4/5  Hip adduction    Hip internal rotation    Hip external rotation    Knee flexion    Knee extension    Ankle dorsiflexion    Ankle plantarflexion    Ankle inversion    Ankle eversion     (Blank rows = not tested)  LUMBAR SPECIAL TESTS:  Straight leg raise test: Negative, Slump test: Negative, and FAIR positive  FUNCTIONAL TESTS:  5 times sit to stand: 11 seconds  GAIT: Distance walked: 60ft Assistive device utilized: None Level of assistance: Complete Independence Comments: no overt deviations  TREATMENT: [facility] Adult PT Treatment:                                                DATE: 03/27/2023 Therapeutic Exercise: Seated sciatic nerve glide x 5 R Supine sciatic nerve glide x 5 R  Supine piriformis stretch x 30" R Clamshell x 5 GTB R S/L hip abd x 5  PATIENT EDUCATION:  Education details: eval findings, FOTO, HEP, POC Person educated: Patient Education method: Explanation, Demonstration, and Handouts Education comprehension: verbalized understanding and returned demonstration  HOME EXERCISE PROGRAM:  Access Code: [email or web address] URL: [email or web address] Date: 03/27/2023 Prepared by: Edwinna Areola  Exercises - Seated Sciatic Tensioner  - 1 x daily - 7 x weekly - 2 sets - 15 reps -  Supine Sciatic Nerve Glide  - 1 x daily - 7 x weekly - 2 sets - 15 reps - Supine Figure 4 Piriformis Stretch  - 1 x daily - 7 x weekly - 3 sets - 2 reps - 30 sec hold - Supine Piriformis Stretch with Leg Straight  - 1 x daily - 7 x weekly - 2 reps - 30 sec hold - Clamshell with Resistance  - 3-4 x weekly - 2-3 sets - 15 reps - green band hold - Sidelying Hip Abduction  - 3-4 x weekly - 3 sets - 10 reps  ASSESSMENT:  CLINICAL IMPRESSION: Patient is a 67 y.o. F who was seen today for physical therapy evaluation and treatment for acute on chronic lower back pain with referral into R LE. Physical findings are consistent with physician impression as pt demonstrates adverse neural tensioning and decreased core/hip strength and functional mobility. FOTO score shows subjective decrease in functional ability below PLOF. Pt would benefit from skilled PT services working on decreasing back and R LE pain and discomfort.    OBJECTIVE IMPAIRMENTS: decreased activity tolerance, decreased mobility, decreased ROM, decreased strength, and pain.   ACTIVITY LIMITATIONS: standing, squatting, stairs, transfers, and locomotion level  PARTICIPATION LIMITATIONS: driving, shopping, community activity, yard work, and tennis  PERSONAL FACTORS: Fitness are also affecting patient's functional outcome.   REHAB POTENTIAL: Excellent  CLINICAL DECISION MAKING: Stable/uncomplicated  EVALUATION COMPLEXITY: Low   GOALS: Goals reviewed with patient? No  SHORT TERM GOALS: Target date: 04/17/2023   Pt will be compliant and knowledgeable with initial HEP for improved comfort and carryover Baseline: initial HEP given  Goal status: INITIAL  LONG TERM GOALS: Target date: 05/22/2023   Pt will improve FOTO function score to no less than 66% as proxy for functional improvement Baseline: 47% function Goal status: INITIAL   2.  Pt will self report R LE pain no greater than 1/10 for improved comfort and functional  ability Baseline: 3/10 at worst Goal status: INITIAL   3.  Pt will improve R hip IR ROM to no less than 25 degrees for improved muscle length in proximal hip for decreased pain Baseline: 18 degrees Goal status: INITIAL  4.  Pt will improve bilateral hip abduction MMT to no less than 5/5 for improved comfort and functional mobility Baseline:  Goal status: INITIAL   PLAN:  PT FREQUENCY: 1x/week  PT DURATION: 8 weeks  PLANNED INTERVENTIONS: 97164- PT Re-evaluation, 97110-Therapeutic exercises, 97530- Therapeutic activity, [id number]- Neuromuscular re-education, 97535- Self Care, [id number]- Manual therapy, [id number]- Gait training, [id number]- Aquatic Therapy, 97016- Vasopneumatic device, Dry Needling, Cryotherapy, and Moist heat.  PLAN FOR NEXT SESSION: assess HEP response, TPDN to R piriformis and glute med, lateral hip strengthening/stretching   Eloy End, PT 03/27/2023, 1:40 PM

## 2023-04-01 NOTE — Therapy (Signed)
OUTPATIENT PHYSICAL THERAPY THORACOLUMBAR TREATMENT   Patient Name: Rachel Beasley MRN: [id number] DOB:1956/04/18, 67 y.o., female Today's Date: 04/02/2023  END OF SESSION:  PT End of Session - 04/02/23 1231     Visit Number 2    Number of Visits 9    Date for PT Re-Evaluation 05/22/23    Authorization Type [employer]    PT Start Time 1230    PT Stop Time 1320    PT Time Calculation (min) 50 min    Activity Tolerance Patient tolerated treatment well    Behavior During Therapy [facility] for tasks assessed/performed              Past Medical History:  Diagnosis Date   Dyspareunia    Endometriosis    Past Surgical History:  Procedure Laterality Date   BICEPT TENODESIS Right 07/04/2021   Procedure: BICEPS TENODESIS;  Surgeon: Bjorn Pippin, MD;  Location: [facility];  Service: Orthopedics;  Laterality: Right;   CESAREAN SECTION  1991   LAPAROSCOPY  1989   OVARIAN CYST REMOVAL  1988   benign- endometrioma- laparatomy then danazol 6 months   SHOULDER ACROMIOPLASTY Right 07/04/2021   Procedure: SHOULDER ACROMIOPLASTY;  Surgeon: Bjorn Pippin, MD;  Location: [facility];  Service: Orthopedics;  Laterality: Right;   SHOULDER ARTHROSCOPY Right 07/04/2021   Procedure: ARTHROSCOPY SHOULDER;  Surgeon: Bjorn Pippin, MD;  Location: [facility];  Service: Orthopedics;  Laterality: Right;   SPINAL FUSION  2006   C5-6 rupture   Patient Active Problem List   Diagnosis Date Noted   Osteopenia 08/29/2020   Primary insomnia 08/29/2020   History of colonic polyps 08/28/2020   Vitamin D deficiency 08/28/2020   Hormone replacement therapy (HRT) 02/13/2017   Right foot pain 02/22/2013   Lateral epicondylitis 12/26/2009    PCP: Lorenda Ishihara, MD  REFERRING PROVIDER: Andi Devon, DO    REFERRING DIAG: M54.31 (ICD-10-CM) - Sciatica, right side   Rationale for Evaluation and Treatment: Rehabilitation  THERAPY DIAG:  Stiffness  of right hip, not elsewhere classified  Muscle weakness (generalized)  Other low back pain  ONSET DATE: Chronic  SUBJECTIVE:                                                                                                                                                                                           SUBJECTIVE STATEMENT: Pt with pain 1/10 and worst at 3/10 today.  Pt has wedding of dtr in 2 weeks  and trip to [place] and would like to alleviate pain  EVAL-Pt presents to PT with reports  of subacute posterior R hip pain and paresthesias that extend to lateral R knee. Denies overt LBP, notes pain is not constant and is alleviated with movement and exercise. Is very active doing boot camp exercise 2-3x/wk and was regularly playing tennis until recent onset of R LE pain. Denies bowel/bladder changes or saddle anesthesia.   PERTINENT HISTORY:  C5-6 spinal fusion  PAIN:  Are you having pain?  Yes: NPRS scale: 1/10 Worst: 3/10 Pain location: Posterior R LE Pain description: sharp Aggravating factors: prolonged sitting Relieving factors: movement, exercise   PRECAUTIONS: None  RED FLAGS: None   WEIGHT BEARING RESTRICTIONS: No  FALLS:  Has patient fallen in last 6 months? No  LIVING ENVIRONMENT: Lives with: lives with their family Lives in: House/apartment Stairs: No issue Has following equipment at home: None  OCCUPATION: Not currently working  PLOF: Independent  PATIENT GOALS: decrease pain/paresthesias in R LE, get back to playing tennis  NEXT MD VISIT: PRN  OBJECTIVE:  Note: Objective measures were completed at Evaluation unless otherwise noted.  DIAGNOSTIC FINDINGS:  N/A  PATIENT SURVEYS:  FOTO: 47% function; 66% predicted  COGNITION: Overall cognitive status: Within functional limits for tasks assessed     SENSATION: WFL  MUSCLE LENGTH: Hamstrings: Right WFL deg; Left WFL deg Thomas test: Right (-) deg; Left (-) deg  POSTURE: No Significant  postural limitations  PALPATION: TTP to R piriformis (in FAIR test position)  LUMBAR ROM:   AROM eval  Flexion WFL  Extension WFL  Right lateral flexion   Left lateral flexion   Right rotation   Left rotation    (Blank rows = not tested)  LOWER EXTREMITY ROM:     Active  Right eval Left eval  Hip flexion    Hip extension    Hip abduction    Hip adduction    Hip internal rotation 18 36  Hip external rotation    Knee flexion    Knee extension    Ankle dorsiflexion    Ankle plantarflexion    Ankle inversion    Ankle eversion     (Blank rows = not tested)  LOWER EXTREMITY MMT:    MMT Right eval Left eval  Hip flexion    Hip extension    Hip abduction 4/5 4/5  Hip adduction    Hip internal rotation    Hip external rotation    Knee flexion    Knee extension    Ankle dorsiflexion    Ankle plantarflexion    Ankle inversion    Ankle eversion     (Blank rows = not tested)  LUMBAR SPECIAL TESTS:  Straight leg raise test: Negative, Slump test: Negative, and FAIR positive  FUNCTIONAL TESTS:  5 times sit to stand: 11 seconds  GAIT: Distance walked: 31ft Assistive device utilized: None Level of assistance: Complete Independence Comments: no overt deviations  TREATMENT: [facility] Adult PT Treatment:                                                DATE: 04-02-23 Therapeutic Exercise: Seated sciatic nerve glide x 5 R Supine sciatic nerve glide x 5 R  Supine piriformis stretch x 30" R Clamshell x 10  GTB R S/L hip abd  4 x 5 each with VC and TC IT band stretch with green strap 2 x 30 sec Manual Therapy: STW-  lumbar paraspinal, R gluts, R piriformis.  R quad/ vastus lateralis and rectus femoris distal end  LAD R  Testing of R knee LCL with positive pain Trigger Point Dry-Needling performed     by Garen Lah Treatment instructions: Expect mild to moderate muscle soreness. S/S of pneumothorax if dry needled over a lung field, and to seek immediate medical  attention should they occur. Patient verbalized understanding of these instructions and education.  Patient Consent Given: Yes Education handout provided: Previously provided Muscles treated:   lumbar paraspinal, R gluts, R piriformis.  R quad/ vastus lateralis and rectus femoris distal end Electrical stimulation performed: No Parameters: N/A Treatment response/outcome: twitch response noted, pt noted relief  [facility] Adult PT Treatment:                                                DATE: 03/27/2023 Therapeutic Exercise: Seated sciatic nerve glide x 5 R Supine sciatic nerve glide x 5 R  Supine piriformis stretch x 30" R Clamshell x 5 GTB R S/L hip abd x 5  PATIENT EDUCATION:  Education details: eval findings, FOTO, HEP, POC Person educated: Patient Education method: Explanation, Demonstration, and Handouts Education comprehension: verbalized understanding and returned demonstration  HOME EXERCISE PROGRAM: Access Code: [email or web address] URL: [email or web address] Date: 03/27/2023 Prepared by: Edwinna Areola Program Notes For hip abduction roll hip forward and touch foot to wall for exercise Exercises - Seated Sciatic Tensioner  - 1 x daily - 7 x weekly - 2 sets - 15 reps - Supine Sciatic Nerve Glide  - 1 x daily - 7 x weekly - 2 sets - 15 reps - Supine Figure 4 Piriformis Stretch  - 1 x daily - 7 x weekly - 3 sets - 2 reps - 30 sec hold - Supine Piriformis Stretch with Leg Straight  - 1 x daily - 7 x weekly - 2 reps - 30 sec hold - Clamshell with Resistance  - 3-4 x weekly - 2-3 sets - 15 reps - green band hold - Sidelying Hip Abduction  - 3-4 x weekly - 3 sets - 10 reps - Supine ITB Stretch with Strap  - 1 x daily - 7 x weekly - 1 sets - 3 reps - 30-60 hold  CLINICAL IMPRESSION:  Pt returns to clinic with complaint of R sciatic pain and also pain at knee.  Pt with some lateral collateral ligament strain reproduced with testing and sciatic nerve pain. Pt consented to TPDN and  was closely monitored with pain and tissue tension decreased.  Reinforcement of HEP and updated with IT band stretch and gave moderate cuing for correct sidelying hip abduction.  Pt left with all questions answered and no adverse reactions.  EVAL- Patient is a 67 y.o. F who was seen today for physical therapy evaluation and treatment for acute on chronic lower back pain with referral into R LE. Physical findings are consistent with physician impression as pt demonstrates adverse neural tensioning and decreased core/hip strength and functional mobility. FOTO score shows subjective decrease in functional ability below PLOF. Pt would benefit from skilled PT services working on decreasing back and R LE pain and discomfort.    OBJECTIVE IMPAIRMENTS: decreased activity tolerance, decreased mobility, decreased ROM, decreased strength, and pain.   ACTIVITY LIMITATIONS: standing, squatting, stairs, transfers, and locomotion level  PARTICIPATION LIMITATIONS: driving, shopping, community activity, yard  work, and tennis  PERSONAL FACTORS: Fitness are also affecting patient's functional outcome.   REHAB POTENTIAL: Excellent  CLINICAL DECISION MAKING: Stable/uncomplicated  EVALUATION COMPLEXITY: Low   GOALS: Goals reviewed with patient? No  SHORT TERM GOALS: Target date: 04/17/2023   Pt will be compliant and knowledgeable with initial HEP for improved comfort and carryover Baseline: initial HEP given  Goal status: INITIAL  LONG TERM GOALS: Target date: 05/22/2023   Pt will improve FOTO function score to no less than 66% as proxy for functional improvement Baseline: 47% function Goal status: INITIAL   2.  Pt will self report R LE pain no greater than 1/10 for improved comfort and functional ability Baseline: 3/10 at worst Goal status: INITIAL   3.  Pt will improve R hip IR ROM to no less than 25 degrees for improved muscle length in proximal hip for decreased pain Baseline: 18 degrees Goal  status: INITIAL  4.  Pt will improve bilateral hip abduction MMT to no less than 5/5 for improved comfort and functional mobility Baseline:  Goal status: INITIAL   PLAN:  PT FREQUENCY: 1x/week  PT DURATION: 8 weeks  PLANNED INTERVENTIONS: [id number]- PT Re-evaluation, 97110-Therapeutic exercises, 97530- Therapeutic activity, [id number]- Neuromuscular re-education, 97535- Self Care, [id number]- Manual therapy, [id number]- Gait training, [id number]- Aquatic Therapy, 97016- Vasopneumatic device, Dry Needling, Cryotherapy, and Moist heat.  PLAN FOR NEXT SESSION: assess HEP response, TPDN to R piriformis and glute med, lateral hip strengthening/stretching  Garen Lah, PT, [facility] Certified Exercise Expert for the Aging Adult  04/02/23 1:32 PM Phone: [phone] Fax: [phone]

## 2023-04-02 ENCOUNTER — Ambulatory Visit: Payer: 59 | Admitting: Physical Therapy

## 2023-04-02 ENCOUNTER — Encounter: Payer: Self-pay | Admitting: Physical Therapy

## 2023-04-02 DIAGNOSIS — M25651 Stiffness of right hip, not elsewhere classified: Secondary | ICD-10-CM

## 2023-04-02 DIAGNOSIS — M5459 Other low back pain: Secondary | ICD-10-CM

## 2023-04-02 DIAGNOSIS — M5431 Sciatica, right side: Secondary | ICD-10-CM | POA: Diagnosis not present

## 2023-04-02 DIAGNOSIS — M6281 Muscle weakness (generalized): Secondary | ICD-10-CM | POA: Diagnosis not present

## 2023-04-08 ENCOUNTER — Ambulatory Visit: Payer: 59 | Attending: Family Medicine | Admitting: Physical Therapy

## 2023-04-08 DIAGNOSIS — M6281 Muscle weakness (generalized): Secondary | ICD-10-CM | POA: Diagnosis not present

## 2023-04-08 DIAGNOSIS — M25651 Stiffness of right hip, not elsewhere classified: Secondary | ICD-10-CM | POA: Insufficient documentation

## 2023-04-08 DIAGNOSIS — M5459 Other low back pain: Secondary | ICD-10-CM | POA: Diagnosis not present

## 2023-04-08 NOTE — Patient Instructions (Signed)
Zayleigh   remove Ionto patch at 9:00 PM on 04-08-23  IONTOPHORESIS PATIENT PRECAUTIONS & CONTRAINDICATIONS:  Redness under one or both electrodes can occur.  This characterized by a uniform redness that usually disappears within 12 hours of treatment. Small pinhead size blisters may result in response to the drug.  Contact your physician if the problem persists more than 24 hours. On rare occasions, iontophoresis therapy can result in temporary skin reactions such as rash, inflammation, irritation or burns.  The skin reactions may be the result of individual sensitivity to the ionic solution used, the condition of the skin at the start of treatment, reaction to the materials in the electrodes, allergies or sensitivity to dexamethasone, or a poor connection between the patch and your skin.  Discontinue using iontophoresis if you have any of these reactions and report to your therapist. Remove the Patch or electrodes if you have any undue sensation of pain or burning during the treatment and report discomfort to your therapist. Tell your Therapist if you have had known adverse reactions to the application of electrical current. If using the Patch, the LED light will turn off when treatment is complete and the patch can be removed.  Approximate treatment time is 1-3 hours.  Remove the patch when light goes off or after 6 hours. The Patch can be worn during normal activity, however excessive motion where the electrodes have been placed can cause poor contact between the skin and the electrode or uneven electrical current resulting in greater risk of skin irritation. Keep out of the reach of children.   DO NOT use if you have a cardiac pacemaker or any other electrically sensitive implanted device. DO NOT use if you have a known sensitivity to dexamethasone. DO NOT use during Magnetic Resonance Imaging (MRI). DO NOT use over broken or compromised skin (e.g. sunburn, cuts, or acne) due to the increased  risk of skin reaction. DO NOT SHAVE over the area to be treated:  To establish good contact between the Patch and the skin, excessive hair may be clipped. DO NOT place the Patch or electrodes on or over your eyes, directly over your heart, or brain. DO NOT reuse the Patch or electrodes as this may cause burns to occur.  Garen Lah, PT, [facility] Certified Exercise Expert for the Aging Adult  04/08/23 2:38 PM Phone: [phone] Fax: [phone]

## 2023-04-15 ENCOUNTER — Ambulatory Visit: Payer: 59

## 2023-04-15 DIAGNOSIS — M6281 Muscle weakness (generalized): Secondary | ICD-10-CM

## 2023-04-15 DIAGNOSIS — M25651 Stiffness of right hip, not elsewhere classified: Secondary | ICD-10-CM | POA: Diagnosis not present

## 2023-04-15 DIAGNOSIS — M25561 Pain in right knee: Secondary | ICD-10-CM | POA: Diagnosis not present

## 2023-04-15 DIAGNOSIS — M5459 Other low back pain: Secondary | ICD-10-CM | POA: Diagnosis not present

## 2023-04-15 NOTE — Therapy (Signed)
OUTPATIENT PHYSICAL THERAPY THORACOLUMBAR TREATMENT   Patient Name: Rachel Beasley MRN: [id number] DOB:09/10/1955, 67 y.o., female Today's Date: 04/16/2023  END OF SESSION:  PT End of Session - 04/15/23 1444     Visit Number 4    Number of Visits 9    Date for PT Re-Evaluation 05/22/23    Authorization Type [employer]    PT Start Time 1445    PT Stop Time 1525    PT Time Calculation (min) 40 min    Activity Tolerance Patient tolerated treatment well    Behavior During Therapy [facility] for tasks assessed/performed                Past Medical History:  Diagnosis Date   Dyspareunia    Endometriosis    Past Surgical History:  Procedure Laterality Date   BICEPT TENODESIS Right 07/04/2021   Procedure: BICEPS TENODESIS;  Surgeon: Bjorn Pippin, MD;  Location: [facility];  Service: Orthopedics;  Laterality: Right;   CESAREAN SECTION  1991   LAPAROSCOPY  1989   OVARIAN CYST REMOVAL  1988   benign- endometrioma- laparatomy then danazol 6 months   SHOULDER ACROMIOPLASTY Right 07/04/2021   Procedure: SHOULDER ACROMIOPLASTY;  Surgeon: Bjorn Pippin, MD;  Location: [facility];  Service: Orthopedics;  Laterality: Right;   SHOULDER ARTHROSCOPY Right 07/04/2021   Procedure: ARTHROSCOPY SHOULDER;  Surgeon: Bjorn Pippin, MD;  Location: [facility];  Service: Orthopedics;  Laterality: Right;   SPINAL FUSION  2006   C5-6 rupture   Patient Active Problem List   Diagnosis Date Noted   Osteopenia 08/29/2020   Primary insomnia 08/29/2020   History of colonic polyps 08/28/2020   Vitamin D deficiency 08/28/2020   Hormone replacement therapy (HRT) 02/13/2017   Right foot pain 02/22/2013   Lateral epicondylitis 12/26/2009    PCP: Lorenda Ishihara, MD  REFERRING PROVIDER: Andi Devon, DO    REFERRING DIAG: M54.31 (ICD-10-CM) - Sciatica, right side   Rationale for Evaluation and Treatment: Rehabilitation  THERAPY DIAG:   Stiffness of right hip, not elsewhere classified  Muscle weakness (generalized)  ONSET DATE: Chronic  SUBJECTIVE:                                                                                                                                                                                           SUBJECTIVE STATEMENT: Pt presents to PT with no current sciatica symptoms and these have not been present in last week. Does have R lateral knee pain after dancing at her daughters wedding. Is traveling to [place] and will gone over next  two weeks.   EVAL-Pt presents to PT with reports of subacute posterior R hip pain and paresthesias that extend to lateral R knee. Denies overt LBP, notes pain is not constant and is alleviated with movement and exercise. Is very active doing boot camp exercise 2-3x/wk and was regularly playing tennis until recent onset of R LE pain. Denies bowel/bladder changes or saddle anesthesia.   PERTINENT HISTORY:  C5-6 spinal fusion  PAIN:  Are you having pain?  Yes: NPRS scale: 1/10 Worst: 3/10 Pain location: Posterior R LE Pain description: sharp Aggravating factors: prolonged sitting Relieving factors: movement, exercise   PRECAUTIONS: None  RED FLAGS: None   WEIGHT BEARING RESTRICTIONS: No  FALLS:  Has patient fallen in last 6 months? No  LIVING ENVIRONMENT: Lives with: lives with their family Lives in: House/apartment Stairs: No issue Has following equipment at home: None  OCCUPATION: Not currently working  PLOF: Independent  PATIENT GOALS: decrease pain/paresthesias in R LE, get back to playing tennis  NEXT MD VISIT: PRN  OBJECTIVE:  Note: Objective measures were completed at Evaluation unless otherwise noted.  DIAGNOSTIC FINDINGS:  N/A  PATIENT SURVEYS:  FOTO: 47% function; 66% predicted  COGNITION: Overall cognitive status: Within functional limits for tasks assessed     SENSATION: WFL  MUSCLE LENGTH: Hamstrings: Right WFL  deg; Left WFL deg Thomas test: Right (-) deg; Left (-) deg  POSTURE: No Significant postural limitations  PALPATION: TTP to R piriformis (in FAIR test position)  LUMBAR ROM:   AROM eval  Flexion WFL  Extension WFL  Right lateral flexion   Left lateral flexion   Right rotation   Left rotation    (Blank rows = not tested)  LOWER EXTREMITY ROM:     Active  Right eval Left eval  Hip flexion    Hip extension    Hip abduction    Hip adduction    Hip internal rotation 18 36  Hip external rotation    Knee flexion    Knee extension    Ankle dorsiflexion    Ankle plantarflexion    Ankle inversion    Ankle eversion     (Blank rows = not tested)  LOWER EXTREMITY MMT:    MMT Right eval Left eval  Hip flexion    Hip extension    Hip abduction 4/5 4/5  Hip adduction    Hip internal rotation    Hip external rotation    Knee flexion    Knee extension    Ankle dorsiflexion    Ankle plantarflexion    Ankle inversion    Ankle eversion     (Blank rows = not tested)  LUMBAR SPECIAL TESTS:  Straight leg raise test: Negative, Slump test: Negative, and FAIR positive  FUNCTIONAL TESTS:  5 times sit to stand: 11 seconds  GAIT: Distance walked: 17ft Assistive device utilized: None Level of assistance: Complete Independence Comments: no overt deviations  TREATMENT: [facility] Adult PT Treatment:                                                DATE: 04/15/2023 Therapeutic Exercise: ITB stretch with strap 2x30" R Supine hamstring stretch with strap 2x30" R Standing ITB stretch 2x30" R Seated hamstring stretch 2x30" R McConnell tape applied to R knee with lateral glide  Manual Therapy: Skilled palpation of trigger points for TPDN  STM to lumbar paraspinals Trigger Point Dry Needling Treatment: Pre-treatment instruction: Patient instructed on dry needling rationale, procedures, and possible side effects including pain during treatment (achy,cramping feeling), bruising, drop of  blood, lightheadedness, nausea, sweating. Patient Consent Given: Yes Education handout provided: No Muscles treated: R lumbar multifidi, R vastus lateralis, R TFL  Needle size and number: .30x4mm x 2; .30x52mm x 3 Electrical stimulation performed: No Parameters: N/A Treatment response/outcome: Twitch response elicited and Palpable decrease in muscle tension Post-treatment instructions: Patient instructed to expect possible mild to moderate muscle soreness later today and/or tomorrow. Patient instructed in methods to reduce muscle soreness and to continue prescribed HEP. If patient was dry needled over the lung field, patient was instructed on signs and symptoms of pneumothorax and, however unlikely, to see immediate medical attention should they occur. Patient was also educated on signs and symptoms of infection and to seek medical attention should they occur. Patient verbalized understanding of these instructions and education.   [facility] Adult PT Treatment:                                                DATE: 04-08-23 Therapeutic Exercise: Resisted R knee with RTB Resisted R Knee flexion RTB Kickstand dead lift  Updated HEP Manual Therapy:  lumbar paraspinal, R gluts, R piriformis.  R  lateral hamstring LAD R Trigger Point Dry-Needling performed Treatment instructions: Expect mild to moderate muscle soreness. S/S of pneumothorax if dry needled over a lung field, and to seek immediate medical attention should they occur. Patient verbalized understanding of these instructions and education.  Patient Consent Given: Yes Education handout provided: Previously provided Muscles treated:   lateral hamstiring  Right Electrical stimulation performed: No Parameters: N/A Treatment response/outcome: twitch response noted, pt noted relief  Modalities: .iontophoresis patch  to be removed 4-6 hours.1 cc dexamethason ot Right lateral knee  [facility] Adult PT Treatment:                                                 DATE: 04-02-23 Therapeutic Exercise: Seated sciatic nerve glide x 5 R Supine sciatic nerve glide x 5 R  Supine piriformis stretch x 30" R Clamshell x 10  GTB R S/L hip abd  4 x 5 each with VC and TC IT band stretch with green strap 2 x 30 sec Manual Therapy: STW-  lumbar paraspinal, R gluts, R piriformis.  R quad/ vastus lateralis and rectus femoris distal end  LAD R  Testing of R knee LCL with positive pain Trigger Point Dry-Needling performed     by Garen Lah Treatment instructions: Expect mild to moderate muscle soreness. S/S of pneumothorax if dry needled over a lung field, and to seek immediate medical attention should they occur. Patient verbalized understanding of these instructions and education.  Patient Consent Given: Yes Education handout provided: Previously provided Muscles treated:   lumbar paraspinal, R gluts, R piriformis.  R quad/ vastus lateralis and rectus femoris distal end Electrical stimulation performed: No Parameters: N/A Treatment response/outcome: twitch response noted, pt noted relief  [facility] Adult PT Treatment:  DATE: 03/27/2023 Therapeutic Exercise: Seated sciatic nerve glide x 5 R Supine sciatic nerve glide x 5 R  Supine piriformis stretch x 30" R Clamshell x 5 GTB R S/L hip abd x 5  PATIENT EDUCATION:  Education details: eval findings, FOTO, HEP, POC Person educated: Patient Education method: Explanation, Demonstration, and Handouts Education comprehension: verbalized understanding and returned demonstration  HOME EXERCISE PROGRAM: Access Code: [email or web address] URL: [email or web address] Date: 04/16/2023 Prepared by: Edwinna Areola  Program Notes For hip abduction roll hip forward and touch foot to wall for exercise  Exercises - Seated Sciatic Tensioner  - 1 x daily - 7 x weekly - 2 sets - 15 reps - Supine Sciatic Nerve Glide  - 1 x daily - 7 x weekly - 2 sets - 15 reps -  Supine Figure 4 Piriformis Stretch  - 1 x daily - 7 x weekly - 3 sets - 2 reps - 30 sec hold - Supine Piriformis Stretch with Leg Straight  - 1 x daily - 7 x weekly - 2 reps - 30 sec hold - Clamshell with Resistance  - 3-4 x weekly - 2-3 sets - 15 reps - green band hold - Sidelying Hip Abduction  - 3-4 x weekly - 3 sets - 10 reps - Supine ITB Stretch with Strap  - 1 x daily - 7 x weekly - 1 sets - 3 reps - 30-60 hold - pigeon stretch on chair  - 1 x daily - 7 x weekly - 1 sets - 3 reps - 30 sec -60 sec  hold - Seated Knee Extension with Resistance  - 1 x daily - 7 x weekly - 3 sets - 10 reps - Standing Hamstring Curl with Resistance  - 1 x daily - 7 x weekly - 3 sets - 10 reps - Supine ITB Stretch with Strap  - 1 x daily - 7 x weekly - 2 reps - 30 sec hold - ITB Stretch at Wall  - 1 x daily - 7 x weekly - 2 reps - 30 sec hold - Supine Hamstring Stretch with Strap  - 1 x daily - 7 x weekly - 2 reps - 30 sec hold - Seated Hamstring Stretch  - 1 x daily - 7 x weekly - 2 reps - 30 sec hold  CLINICAL IMPRESSION: Pt responded well to PT intervention today with no adverse effect, noting decrease in pain post TPDN. HEP updated to reduce onset of lateral thigh and R lateral knee pain. Pt continues to progress well with therapy, will assess response to interventions and overseas travel at next session.   EVAL- Patient is a 67 y.o. F who was seen today for physical therapy evaluation and treatment for acute on chronic lower back pain with referral into R LE. Physical findings are consistent with physician impression as pt demonstrates adverse neural tensioning and decreased core/hip strength and functional mobility. FOTO score shows subjective decrease in functional ability below PLOF. Pt would benefit from skilled PT services working on decreasing back and R LE pain and discomfort.    OBJECTIVE IMPAIRMENTS: decreased activity tolerance, decreased mobility, decreased ROM, decreased strength, and pain.    ACTIVITY LIMITATIONS: standing, squatting, stairs, transfers, and locomotion level  PARTICIPATION LIMITATIONS: driving, shopping, community activity, yard work, and tennis  PERSONAL FACTORS: Fitness are also affecting patient's functional outcome.   REHAB POTENTIAL: Excellent  CLINICAL DECISION MAKING: Stable/uncomplicated  EVALUATION COMPLEXITY: Low   GOALS: Goals reviewed with patient? No  SHORT TERM GOALS: Target  date: 04/17/2023   Pt will be compliant and knowledgeable with initial HEP for improved comfort and carryover Baseline: initial HEP given  Goal status: MET  LONG TERM GOALS: Target date: 05/22/2023   Pt will improve FOTO function score to no less than 66% as proxy for functional improvement Baseline: 47% function Goal status: INITIAL   2.  Pt will self report R LE pain no greater than 1/10 for improved comfort and functional ability Baseline: 3/10 at worst Goal status: INITIAL   3.  Pt will improve R hip IR ROM to no less than 25 degrees for improved muscle length in proximal hip for decreased pain Baseline: 18 degrees Goal status: INITIAL  4.  Pt will improve bilateral hip abduction MMT to no less than 5/5 for improved comfort and functional mobility Baseline:  Goal status: INITIAL   PLAN:  PT FREQUENCY: 1x/week  PT DURATION: 8 weeks  PLANNED INTERVENTIONS: 97164- PT Re-evaluation, 97110-Therapeutic exercises, 97530- Therapeutic activity, [id number]- Neuromuscular re-education, 97535- Self Care, [id number]- Manual therapy, [id number]- Gait training, [id number]- Aquatic Therapy, [id number]- Vasopneumatic device, Dry Needling, Cryotherapy, and Moist heat.  PLAN FOR NEXT SESSION: assess HEP response, TPDN to R piriformis and glute med, lateral hip strengthening/stretching  Eloy End PT  04/16/23 9:54 AM

## 2023-04-28 NOTE — Therapy (Signed)
OUTPATIENT PHYSICAL THERAPY THORACOLUMBAR TREATMENT   Patient Name: Rachel Beasley MRN: [id number] DOB:September 30, 1955, 67 y.o., female Today's Date: 04/28/2023  END OF SESSION:       Past Medical History:  Diagnosis Date   Dyspareunia    Endometriosis    Past Surgical History:  Procedure Laterality Date   BICEPT TENODESIS Right 07/04/2021   Procedure: BICEPS TENODESIS;  Surgeon: Bjorn Pippin, MD;  Location: [facility];  Service: Orthopedics;  Laterality: Right;   CESAREAN SECTION  1991   LAPAROSCOPY  1989   OVARIAN CYST REMOVAL  1988   benign- endometrioma- laparatomy then danazol 6 months   SHOULDER ACROMIOPLASTY Right 07/04/2021   Procedure: SHOULDER ACROMIOPLASTY;  Surgeon: Bjorn Pippin, MD;  Location: [facility];  Service: Orthopedics;  Laterality: Right;   SHOULDER ARTHROSCOPY Right 07/04/2021   Procedure: ARTHROSCOPY SHOULDER;  Surgeon: Bjorn Pippin, MD;  Location: [facility];  Service: Orthopedics;  Laterality: Right;   SPINAL FUSION  2006   C5-6 rupture   Patient Active Problem List   Diagnosis Date Noted   Osteopenia 08/29/2020   Primary insomnia 08/29/2020   History of colonic polyps 08/28/2020   Vitamin D deficiency 08/28/2020   Hormone replacement therapy (HRT) 02/13/2017   Right foot pain 02/22/2013   Lateral epicondylitis 12/26/2009    PCP: Lorenda Ishihara, MD  REFERRING PROVIDER: Andi Devon, DO    REFERRING DIAG: M54.31 (ICD-10-CM) - Sciatica, right side   Rationale for Evaluation and Treatment: Rehabilitation  THERAPY DIAG:  No diagnosis found.  ONSET DATE: Chronic  SUBJECTIVE:                                                                                                                                                                                           SUBJECTIVE STATEMENT: Pt presents to PT with no current sciatica symptoms and these have not been present in last week. Does have R  lateral knee pain after dancing at her daughters wedding. Is traveling to [place] and will gone over next two weeks.   EVAL-Pt presents to PT with reports of subacute posterior R hip pain and paresthesias that extend to lateral R knee. Denies overt LBP, notes pain is not constant and is alleviated with movement and exercise. Is very active doing boot camp exercise 2-3x/wk and was regularly playing tennis until recent onset of R LE pain. Denies bowel/bladder changes or saddle anesthesia.   PERTINENT HISTORY:  C5-6 spinal fusion  PAIN:  Are you having pain?  Yes: NPRS scale: 1/10 Worst: 3/10 Pain location: Posterior R LE Pain description: sharp Aggravating factors:  prolonged sitting Relieving factors: movement, exercise   PRECAUTIONS: None  RED FLAGS: None   WEIGHT BEARING RESTRICTIONS: No  FALLS:  Has patient fallen in last 6 months? No  LIVING ENVIRONMENT: Lives with: lives with their family Lives in: House/apartment Stairs: No issue Has following equipment at home: None  OCCUPATION: Not currently working  PLOF: Independent  PATIENT GOALS: decrease pain/paresthesias in R LE, get back to playing tennis  NEXT MD VISIT: PRN  OBJECTIVE:  Note: Objective measures were completed at Evaluation unless otherwise noted.  DIAGNOSTIC FINDINGS:  N/A  PATIENT SURVEYS:  FOTO: 47% function; 66% predicted  COGNITION: Overall cognitive status: Within functional limits for tasks assessed     SENSATION: WFL  MUSCLE LENGTH: Hamstrings: Right WFL deg; Left WFL deg Thomas test: Right (-) deg; Left (-) deg  POSTURE: No Significant postural limitations  PALPATION: TTP to R piriformis (in FAIR test position)  LUMBAR ROM:   AROM eval  Flexion WFL  Extension WFL  Right lateral flexion   Left lateral flexion   Right rotation   Left rotation    (Blank rows = not tested)  LOWER EXTREMITY ROM:     Active  Right eval Left eval  Hip flexion    Hip extension    Hip  abduction    Hip adduction    Hip internal rotation 18 36  Hip external rotation    Knee flexion    Knee extension    Ankle dorsiflexion    Ankle plantarflexion    Ankle inversion    Ankle eversion     (Blank rows = not tested)  LOWER EXTREMITY MMT:    MMT Right eval Left eval  Hip flexion    Hip extension    Hip abduction 4/5 4/5  Hip adduction    Hip internal rotation    Hip external rotation    Knee flexion    Knee extension    Ankle dorsiflexion    Ankle plantarflexion    Ankle inversion    Ankle eversion     (Blank rows = not tested)  LUMBAR SPECIAL TESTS:  Straight leg raise test: Negative, Slump test: Negative, and FAIR positive  FUNCTIONAL TESTS:  5 times sit to stand: 11 seconds  GAIT: Distance walked: 2ft Assistive device utilized: None Level of assistance: Complete Independence Comments: no overt deviations  TREATMENT: [facility] Adult PT Treatment:                                                DATE: 04-29-23 Therapeutic Exercise: *** Manual Therapy: *** Neuromuscular re-ed: *** Therapeutic Activity: *** Modalities: *** Self Care: ***  [facility] Adult PT Treatment:                                                DATE: 04/15/2023 Therapeutic Exercise: ITB stretch with strap 2x30" R Supine hamstring stretch with strap 2x30" R Standing ITB stretch 2x30" R Seated hamstring stretch 2x30" R McConnell tape applied to R knee with lateral glide  Manual Therapy: Skilled palpation of trigger points for TPDN STM to lumbar paraspinals Trigger Point Dry Needling Treatment: Pre-treatment instruction: Patient instructed on dry needling rationale, procedures, and possible side effects including pain during treatment (  achy,cramping feeling), bruising, drop of blood, lightheadedness, nausea, sweating. Patient Consent Given: Yes Education handout provided: No Muscles treated: R lumbar multifidi, R vastus lateralis, R TFL  Needle size and number: .30x83mm x 2;  .30x19mm x 3 Electrical stimulation performed: No Parameters: N/A Treatment response/outcome: Twitch response elicited and Palpable decrease in muscle tension Post-treatment instructions: Patient instructed to expect possible mild to moderate muscle soreness later today and/or tomorrow. Patient instructed in methods to reduce muscle soreness and to continue prescribed HEP. If patient was dry needled over the lung field, patient was instructed on signs and symptoms of pneumothorax and, however unlikely, to see immediate medical attention should they occur. Patient was also educated on signs and symptoms of infection and to seek medical attention should they occur. Patient verbalized understanding of these instructions and education.   [facility] Adult PT Treatment:                                                DATE: 04-08-23 Therapeutic Exercise: Resisted R knee with RTB Resisted R Knee flexion RTB Kickstand dead lift  Updated HEP Manual Therapy:  lumbar paraspinal, R gluts, R piriformis.  R  lateral hamstring LAD R Trigger Point Dry-Needling performed Treatment instructions: Expect mild to moderate muscle soreness. S/S of pneumothorax if dry needled over a lung field, and to seek immediate medical attention should they occur. Patient verbalized understanding of these instructions and education.  Patient Consent Given: Yes Education handout provided: Previously provided Muscles treated:   lateral hamstiring  Right Electrical stimulation performed: No Parameters: N/A Treatment response/outcome: twitch response noted, pt noted relief  Modalities: .iontophoresis patch  to be removed 4-6 hours.1 cc dexamethason ot Right lateral knee  [facility] Adult PT Treatment:                                                DATE: 04-02-23 Therapeutic Exercise: Seated sciatic nerve glide x 5 R Supine sciatic nerve glide x 5 R  Supine piriformis stretch x 30" R Clamshell x 10  GTB R S/L hip abd  4 x 5 each with VC  and TC IT band stretch with green strap 2 x 30 sec Manual Therapy: STW-  lumbar paraspinal, R gluts, R piriformis.  R quad/ vastus lateralis and rectus femoris distal end  LAD R  Testing of R knee LCL with positive pain Trigger Point Dry-Needling performed     by Rachel Beasley Treatment instructions: Expect mild to moderate muscle soreness. S/S of pneumothorax if dry needled over a lung field, and to seek immediate medical attention should they occur. Patient verbalized understanding of these instructions and education.  Patient Consent Given: Yes Education handout provided: Previously provided Muscles treated:   lumbar paraspinal, R gluts, R piriformis.  R quad/ vastus lateralis and rectus femoris distal end Electrical stimulation performed: No Parameters: N/A Treatment response/outcome: twitch response noted, pt noted relief  [facility] Adult PT Treatment:                                                DATE: 03/27/2023 Therapeutic Exercise:  Seated sciatic nerve glide x 5 R Supine sciatic nerve glide x 5 R  Supine piriformis stretch x 30" R Clamshell x 5 GTB R S/L hip abd x 5  PATIENT EDUCATION:  Education details: eval findings, FOTO, HEP, POC Person educated: Patient Education method: Explanation, Demonstration, and Handouts Education comprehension: verbalized understanding and returned demonstration  HOME EXERCISE PROGRAM: Access Code: [email or web address] URL: [email or web address] Date: 04/16/2023 Prepared by: Rachel Beasley  Program Notes For hip abduction roll hip forward and touch foot to wall for exercise  Exercises - Seated Sciatic Tensioner  - 1 x daily - 7 x weekly - 2 sets - 15 reps - Supine Sciatic Nerve Glide  - 1 x daily - 7 x weekly - 2 sets - 15 reps - Supine Figure 4 Piriformis Stretch  - 1 x daily - 7 x weekly - 3 sets - 2 reps - 30 sec hold - Supine Piriformis Stretch with Leg Straight  - 1 x daily - 7 x weekly - 2 reps - 30 sec hold - Clamshell with  Resistance  - 3-4 x weekly - 2-3 sets - 15 reps - green band hold - Sidelying Hip Abduction  - 3-4 x weekly - 3 sets - 10 reps - Supine ITB Stretch with Strap  - 1 x daily - 7 x weekly - 1 sets - 3 reps - 30-60 hold - pigeon stretch on chair  - 1 x daily - 7 x weekly - 1 sets - 3 reps - 30 sec -60 sec  hold - Seated Knee Extension with Resistance  - 1 x daily - 7 x weekly - 3 sets - 10 reps - Standing Hamstring Curl with Resistance  - 1 x daily - 7 x weekly - 3 sets - 10 reps - Supine ITB Stretch with Strap  - 1 x daily - 7 x weekly - 2 reps - 30 sec hold - ITB Stretch at Wall  - 1 x daily - 7 x weekly - 2 reps - 30 sec hold - Supine Hamstring Stretch with Strap  - 1 x daily - 7 x weekly - 2 reps - 30 sec hold - Seated Hamstring Stretch  - 1 x daily - 7 x weekly - 2 reps - 30 sec hold  CLINICAL IMPRESSION: Pt responded well to PT intervention today with no adverse effect, noting decrease in pain post TPDN. HEP updated to reduce onset of lateral thigh and R lateral knee pain. Pt continues to progress well with therapy, will assess response to interventions and overseas travel at next session.   EVAL- Patient is a 67 y.o. F who was seen today for physical therapy evaluation and treatment for acute on chronic lower back pain with referral into R LE. Physical findings are consistent with physician impression as pt demonstrates adverse neural tensioning and decreased core/hip strength and functional mobility. FOTO score shows subjective decrease in functional ability below PLOF. Pt would benefit from skilled PT services working on decreasing back and R LE pain and discomfort.    OBJECTIVE IMPAIRMENTS: decreased activity tolerance, decreased mobility, decreased ROM, decreased strength, and pain.   ACTIVITY LIMITATIONS: standing, squatting, stairs, transfers, and locomotion level  PARTICIPATION LIMITATIONS: driving, shopping, community activity, yard work, and tennis  PERSONAL FACTORS: Fitness are  also affecting patient's functional outcome.   REHAB POTENTIAL: Excellent  CLINICAL DECISION MAKING: Stable/uncomplicated  EVALUATION COMPLEXITY: Low   GOALS: Goals reviewed with patient? No  SHORT TERM GOALS: Target date: 04/17/2023  Pt will be compliant and knowledgeable with initial HEP for improved comfort and carryover Baseline: initial HEP given  Goal status: MET  LONG TERM GOALS: Target date: 05/22/2023   Pt will improve FOTO function score to no less than 66% as proxy for functional improvement Baseline: 47% function Goal status: INITIAL   2.  Pt will self report R LE pain no greater than 1/10 for improved comfort and functional ability Baseline: 3/10 at worst Goal status: INITIAL   3.  Pt will improve R hip IR ROM to no less than 25 degrees for improved muscle length in proximal hip for decreased pain Baseline: 18 degrees Goal status: INITIAL  4.  Pt will improve bilateral hip abduction MMT to no less than 5/5 for improved comfort and functional mobility Baseline:  Goal status: INITIAL   PLAN:  PT FREQUENCY: 1x/week  PT DURATION: 8 weeks  PLANNED INTERVENTIONS: 97164- PT Re-evaluation, 97110-Therapeutic exercises, 97530- Therapeutic activity, [id number]- Neuromuscular re-education, 97535- Self Care, [id number]- Manual therapy, [id number]- Gait training, [id number]- Aquatic Therapy, 97016- Vasopneumatic device, Dry Needling, Cryotherapy, and Moist heat.  PLAN FOR NEXT SESSION: assess HEP response, TPDN to R piriformis and glute med, lateral hip strengthening/stretching  ***

## 2023-04-29 ENCOUNTER — Encounter: Payer: Self-pay | Admitting: Physical Therapy

## 2023-04-29 ENCOUNTER — Ambulatory Visit: Payer: 59 | Admitting: Physical Therapy

## 2023-04-29 DIAGNOSIS — M5459 Other low back pain: Secondary | ICD-10-CM

## 2023-04-29 DIAGNOSIS — M25651 Stiffness of right hip, not elsewhere classified: Secondary | ICD-10-CM

## 2023-04-29 DIAGNOSIS — M6281 Muscle weakness (generalized): Secondary | ICD-10-CM

## 2023-05-14 NOTE — Therapy (Signed)
OUTPATIENT PHYSICAL THERAPY THORACOLUMBAR TREATMENT    Patient Name: SHAMORA TAUNTON MRN: [id number] DOB:August 31, 1955, 67 y.o., female Today's Date: 05/15/2023  END OF SESSION:  PT End of Session - 05/15/23 1328     Visit Number 6    Number of Visits 9    Date for PT Re-Evaluation 05/22/23    Authorization Type [employer]    PT Start Time 1330    PT Stop Time 1415    PT Time Calculation (min) 45 min    Activity Tolerance Patient tolerated treatment well    Behavior During Therapy [facility] for tasks assessed/performed                  Past Medical History:  Diagnosis Date   Dyspareunia    Endometriosis    Past Surgical History:  Procedure Laterality Date   BICEPT TENODESIS Right 07/04/2021   Procedure: BICEPS TENODESIS;  Surgeon: Bjorn Pippin, MD;  Location: [facility];  Service: Orthopedics;  Laterality: Right;   CESAREAN SECTION  1991   LAPAROSCOPY  1989   OVARIAN CYST REMOVAL  1988   benign- endometrioma- laparatomy then danazol 6 months   SHOULDER ACROMIOPLASTY Right 07/04/2021   Procedure: SHOULDER ACROMIOPLASTY;  Surgeon: Bjorn Pippin, MD;  Location: [facility];  Service: Orthopedics;  Laterality: Right;   SHOULDER ARTHROSCOPY Right 07/04/2021   Procedure: ARTHROSCOPY SHOULDER;  Surgeon: Bjorn Pippin, MD;  Location: [facility];  Service: Orthopedics;  Laterality: Right;   SPINAL FUSION  2006   C5-6 rupture   Patient Active Problem List   Diagnosis Date Noted   Osteopenia 08/29/2020   Primary insomnia 08/29/2020   History of colonic polyps 08/28/2020   Vitamin D deficiency 08/28/2020   Hormone replacement therapy (HRT) 02/13/2017   Right foot pain 02/22/2013   Lateral epicondylitis 12/26/2009    PCP: Lorenda Ishihara, MD  REFERRING PROVIDER: Andi Devon, DO    REFERRING DIAG: M54.31 (ICD-10-CM) - Sciatica, right side   Rationale for Evaluation and Treatment: Rehabilitation  THERAPY DIAG:   Stiffness of right hip, not elsewhere classified  Muscle weakness (generalized)  Other low back pain  ONSET DATE: Chronic  SUBJECTIVE:                                                                                                                                                                                           SUBJECTIVE STATEMENT: Pt reports that they have a new mattress and since that time I was able to sleep in another room. I feel worse in the morning with pain. Pt presents to  PT  with varying pain.  I am not running and jumping like I would like ( bounding up stairs or play tennis).  I can jump rope in the same place.  Sometimes I have burning pain in my Right calf.  I have had nerve pain with my cervical fusion.    EVAL-Pt presents to PT with reports of subacute posterior R hip pain and paresthesias that extend to lateral R knee. Denies overt LBP, notes pain is not constant and is alleviated with movement and exercise. Is very active doing boot camp exercise 2-3x/wk and was regularly playing tennis until recent onset of R LE pain. Denies bowel/bladder changes or saddle anesthesia.   PERTINENT HISTORY:  C5-6 spinal fusion  PAIN:  Are you having pain?  Yes: NPRS scale: 1/10 Worst: 3/10 Pain location: Posterior R LE Pain description: sharp Aggravating factors: prolonged sitting Relieving factors: movement, exercise   PRECAUTIONS: None  RED FLAGS: None   WEIGHT BEARING RESTRICTIONS: No  FALLS:  Has patient fallen in last 6 months? No  LIVING ENVIRONMENT: Lives with: lives with their family Lives in: House/apartment Stairs: No issue Has following equipment at home: None  OCCUPATION: Not currently working  PLOF: Independent  PATIENT GOALS: decrease pain/paresthesias in R LE, get back to playing tennis  NEXT MD VISIT: PRN  OBJECTIVE:  Note: Objective measures were completed at Evaluation unless otherwise noted.  DIAGNOSTIC FINDINGS:  N/A  PATIENT  SURVEYS:  FOTO: 47% function; 66% predicted  COGNITION: Overall cognitive status: Within functional limits for tasks assessed     SENSATION: WFL  MUSCLE LENGTH: Hamstrings: Right WFL deg; Left WFL deg Thomas test: Right (-) deg; Left (-) deg  POSTURE: No Significant postural limitations  PALPATION: TTP to R piriformis (in FAIR test position)  LUMBAR ROM:   AROM eval  Flexion WFL  Extension WFL  Right lateral flexion   Left lateral flexion   Right rotation   Left rotation    (Blank rows = not tested)  LOWER EXTREMITY ROM:     Active  Right eval Left eval  Hip flexion    Hip extension    Hip abduction    Hip adduction    Hip internal rotation 18 36  Hip external rotation    Knee flexion    Knee extension    Ankle dorsiflexion    Ankle plantarflexion    Ankle inversion    Ankle eversion     (Blank rows = not tested)  LOWER EXTREMITY MMT:    MMT Right eval Left eval  Hip flexion    Hip extension    Hip abduction 4/5 4/5  Hip adduction    Hip internal rotation    Hip external rotation    Knee flexion    Knee extension    Ankle dorsiflexion    Ankle plantarflexion    Ankle inversion    Ankle eversion     (Blank rows = not tested)  LUMBAR SPECIAL TESTS:  Straight leg raise test: Negative, Slump test: Negative, and FAIR positive  FUNCTIONAL TESTS:  5 times sit to stand: 11 seconds  GAIT: Distance walked: 35ft Assistive device utilized: None Level of assistance: Complete Independence Comments: no overt deviations  TREATMENT: [facility] Adult PT Treatment:  DATE: 05-15-23 Hamstring work Therapeutic Exercise: Standing Radial Nerve Glide  - 1 x daily - 7 x weekly - 1-2 sets - 10 reps Ulnar Nerve Tensioner  - 1 x daily - 7 x weekly - 1-2 sets - 10 reps Median Nerve Tensioner  - 1 x daily - 7 x weekly - 3 sets - 10 reps Single Leg Deadlift with Kettlebell   10 x with 10 # DB Bridge Walk Out x 10 Reviewed  HEP  Manual Therapy: Trigger Point Dry-Needling performed     by Garen Lah Treatment instructions: Expect mild to moderate muscle soreness. S/S of pneumothorax if dry needled over a lung field, and to seek immediate medical attention should they occur. Patient verbalized understanding of these instructions and education.  Patient Consent Given: Yes Education handout provided: Previously provided Muscles treated: Right  lateral proximal gastroc Electrical stimulation performed: Yes Parameters: 25 pps  to pt tolerance increasing every 2 min until end of session for 8 minutes Treatment response/outcome: twitch response noted, pt noted relief  [facility] Adult PT Treatment:                                                DATE: 04-29-23 Therapeutic Exercise: Heel raise 30 x on L and R Resisted knee ext with blue T band   too challenging Resisted knee ext with green t band  4 x 12 on Right Resisted knee flexion with Green t band 4 x 12 on right and Left Standing ITB stretch 2x30" R Seated hamstring stretch 2x30" R Manual Therapy: Skilled palpation of trigger points for TPDN STM to lumbar paraspinals Trigger Point Dry Needling Treatment: Pre-treatment instruction: Patient instructed on dry needling rationale, procedures, and possible side effects including pain during treatment (achy,cramping feeling), bruising, drop of blood, lightheadedness, nausea, sweating. Patient Consent Given: Yes Education handout provided: No Muscles treated: R Gastroc  lateral head Needle size and number: .30x59mm x 2;  Electrical stimulation performed: No Parameters: N/A Treatment response/outcome: Twitch response elicited and Palpable decrease in muscle tension Post-treatment instructions: Patient instructed to expect possible mild to moderate muscle soreness later today and/or tomorrow. Patient instructed in methods to reduce muscle soreness and to continue prescribed HEP. If patient was dry needled over the  lung field, patient was instructed on signs and symptoms of pneumothorax and, however unlikely, to see immediate medical attention should they occur. Patient was also educated on signs and symptoms of infection and to seek medical attention should they occur. Patient verbalized understanding of these instructions and education.   Self Care: education on progressive overload and how to achieve muscle hypertrophy,  Strength over stretching for pain mananagement   [facility] Adult PT Treatment:                                                DATE: 04/15/2023 Therapeutic Exercise: ITB stretch with strap 2x30" R Supine hamstring stretch with strap 2x30" R Standing ITB stretch 2x30" R Seated hamstring stretch 2x30" R McConnell tape applied to R knee with lateral glide  Manual Therapy: Skilled palpation of trigger points for TPDN STM to lumbar paraspinals Trigger Point Dry Needling Treatment: Pre-treatment instruction: Patient instructed on dry needling rationale, procedures, and possible side  effects including pain during treatment (achy,cramping feeling), bruising, drop of blood, lightheadedness, nausea, sweating. Patient Consent Given: Yes Education handout provided: No Muscles treated: R lumbar multifidi, R vastus lateralis, R TFL  Needle size and number: .30x48mm x 2; .30x66mm x 3 Electrical stimulation performed: No Parameters: N/A Treatment response/outcome: Twitch response elicited and Palpable decrease in muscle tension Post-treatment instructions: Patient instructed to expect possible mild to moderate muscle soreness later today and/or tomorrow. Patient instructed in methods to reduce muscle soreness and to continue prescribed HEP. If patient was dry needled over the lung field, patient was instructed on signs and symptoms of pneumothorax and, however unlikely, to see immediate medical attention should they occur. Patient was also educated on signs and symptoms of infection and to seek medical  attention should they occur. Patient verbalized understanding of these instructions and education.   [facility] Adult PT Treatment:                                                DATE: 04-08-23 Therapeutic Exercise: Resisted R knee with RTB Resisted R Knee flexion RTB Kickstand dead lift  Updated HEP Manual Therapy:  lumbar paraspinal, R gluts, R piriformis.  R  lateral hamstring LAD R Trigger Point Dry-Needling performed Treatment instructions: Expect mild to moderate muscle soreness. S/S of pneumothorax if dry needled over a lung field, and to seek immediate medical attention should they occur. Patient verbalized understanding of these instructions and education.  Patient Consent Given: Yes Education handout provided: Previously provided Muscles treated:   lateral hamstiring  Right Electrical stimulation performed: No Parameters: N/A Treatment response/outcome: twitch response noted, pt noted relief  Modalities: .iontophoresis patch  to be removed 4-6 hours.1 cc dexamethason ot Right lateral knee  [facility] Adult PT Treatment:                                                DATE: 04-02-23 Therapeutic Exercise: Seated sciatic nerve glide x 5 R Supine sciatic nerve glide x 5 R  Supine piriformis stretch x 30" R Clamshell x 10  GTB R S/L hip abd  4 x 5 each with VC and TC IT band stretch with green strap 2 x 30 sec Manual Therapy: STW-  lumbar paraspinal, R gluts, R piriformis.  R quad/ vastus lateralis and rectus femoris distal end  LAD R  Testing of R knee LCL with positive pain Trigger Point Dry-Needling performed     by Garen Lah Treatment instructions: Expect mild to moderate muscle soreness. S/S of pneumothorax if dry needled over a lung field, and to seek immediate medical attention should they occur. Patient verbalized understanding of these instructions and education.  Patient Consent Given: Yes Education handout provided: Previously provided Muscles treated:   lumbar  paraspinal, R gluts, R piriformis.  R quad/ vastus lateralis and rectus femoris distal end Electrical stimulation performed: No Parameters: N/A Treatment response/outcome: twitch response noted, pt noted relief  [facility] Adult PT Treatment:  DATE: 03/27/2023 Therapeutic Exercise: Seated sciatic nerve glide x 5 R Supine sciatic nerve glide x 5 R  Supine piriformis stretch x 30" R Clamshell x 5 GTB R S/L hip abd x 5  PATIENT EDUCATION:  Education details: eval findings, FOTO, HEP, POC Person educated: Patient Education method: Explanation, Demonstration, and Handouts Education comprehension: verbalized understanding and returned demonstration  HOME EXERCISE PROGRAM: Access Code: [email or web address] URL: [email or web address] Date: 04/16/2023 Prepared by: Edwinna Areola  Program Notes For hip abduction roll hip forward and touch foot to wall for exercise  Exercises - Seated Sciatic Tensioner  - 1 x daily - 7 x weekly - 2 sets - 15 reps - Supine Sciatic Nerve Glide  - 1 x daily - 7 x weekly - 2 sets - 15 reps - Supine Figure 4 Piriformis Stretch  - 1 x daily - 7 x weekly - 3 sets - 2 reps - 30 sec hold - Supine Piriformis Stretch with Leg Straight  - 1 x daily - 7 x weekly - 2 reps - 30 sec hold - Clamshell with Resistance  - 3-4 x weekly - 2-3 sets - 15 reps - green band hold - Sidelying Hip Abduction  - 3-4 x weekly - 3 sets - 10 reps - Supine ITB Stretch with Strap  - 1 x daily - 7 x weekly - 1 sets - 3 reps - 30-60 hold - pigeon stretch on chair  - 1 x daily - 7 x weekly - 1 sets - 3 reps - 30 sec -60 sec  hold - Seated Knee Extension with Resistance  - 1 x daily - 7 x weekly - 3 sets - 10 reps - Standing Hamstring Curl with Resistance  - 1 x daily - 7 x weekly - 3 sets - 10 reps - Supine ITB Stretch with Strap  - 1 x daily - 7 x weekly - 2 reps - 30 sec hold - ITB Stretch at Wall  - 1 x daily - 7 x weekly - 2 reps - 30 sec hold -  Supine Hamstring Stretch with Strap  - 1 x daily - 7 x weekly - 2 reps - 30 sec hold - Seated Hamstring Stretch  - 1 x daily - 7 x weekly - 2 reps - 30 sec hold Added to HEP - Standing Radial Nerve Glide  - 1 x daily - 7 x weekly - 1-2 sets - 10 reps - Ulnar Nerve Tensioner  - 1 x daily - 7 x weekly - 1-2 sets - 10 reps - Median Nerve Tensioner  - 1 x daily - 7 x weekly - 3 sets - 10 reps - Single Leg Deadlift with Kettlebell  - 1 x daily - 7 x weekly - 3 sets - 10 reps - Bridge Walk Out  - 1 x daily - 7 x weekly - 3 sets - 10 reps - Kneeling Eccentric Hamstring Strengthening with Caregiver  - 1 x daily - 7 x weekly - 3 sets - 10 reps  CLINICAL IMPRESSION: Pt returns with only residual pain in R proximal lateral gastroc.  Pt updated HEP to include hamstring strengthening. Pt reports pain with doing diagonal plyometric movement but is able to jump rope. Pt consented to [facility] for Right gastroc and was closely monitored throughout session.  With added e stim to pt tolerance for 8 minutes.  Pt left with no adverse effect, noting decrease in pain post TPDN/ estim.  Pt continues to progress well with therapy,  will assess response to interventions and [place] travel at next session.  Pt will need FOTO next session  EVAL- Patient is a 67 y.o. F who was seen today for physical therapy evaluation and treatment for acute on chronic lower back pain with referral into R LE. Physical findings are consistent with physician impression as pt demonstrates adverse neural tensioning and decreased core/hip strength and functional mobility. FOTO score shows subjective decrease in functional ability below PLOF. Pt would benefit from skilled PT services working on decreasing back and R LE pain and discomfort.    OBJECTIVE IMPAIRMENTS: decreased activity tolerance, decreased mobility, decreased ROM, decreased strength, and pain.   ACTIVITY LIMITATIONS: standing, squatting, stairs, transfers, and locomotion  level  PARTICIPATION LIMITATIONS: driving, shopping, community activity, yard work, and tennis  PERSONAL FACTORS: Fitness are also affecting patient's functional outcome.   REHAB POTENTIAL: Excellent  CLINICAL DECISION MAKING: Stable/uncomplicated  EVALUATION COMPLEXITY: Low   GOALS: Goals reviewed with patient? No  SHORT TERM GOALS: Target date: 04/17/2023   Pt will be compliant and knowledgeable with initial HEP for improved comfort and carryover Baseline: initial HEP given  Goal status: MET  LONG TERM GOALS: Target date: 05/22/2023   Pt will improve FOTO function score to no less than 66% as proxy for functional improvement Baseline: 47% function Goal status: INITIAL   2.  Pt will self report R LE pain no greater than 1/10 for improved comfort and functional ability Baseline: 3/10 at worst Goal status: INITIAL   3.  Pt will improve R hip IR ROM to no less than 25 degrees for improved muscle length in proximal hip for decreased pain Baseline: 18 degrees Goal status: INITIAL  4.  Pt will improve bilateral hip abduction MMT to no less than 5/5 for improved comfort and functional mobility Baseline:  Goal status: INITIAL   PLAN:  PT FREQUENCY: 1x/week  PT DURATION: 8 weeks  PLANNED INTERVENTIONS: [id number]- PT Re-evaluation, 97110-Therapeutic exercises, 97530- Therapeutic activity, [id number]- Neuromuscular re-education, 97535- Self Care, 28413- Manual therapy, [id number]- Gait training, [id number]- Aquatic Therapy, 97016- Vasopneumatic device, Dry Needling, Cryotherapy, and Moist heat.  PLAN FOR NEXT SESSION: assess HEP response, TPDN to R piriformis and glute med, lateral hip strengthening/stretching   Garen Lah, PT, ATRIC Certified Exercise Expert for the Aging Adult  05/15/23 3:55 PM Phone: [phone] Fax: [phone]

## 2023-05-15 ENCOUNTER — Ambulatory Visit: Payer: 59 | Attending: Family Medicine | Admitting: Physical Therapy

## 2023-05-15 ENCOUNTER — Encounter: Payer: Self-pay | Admitting: Physical Therapy

## 2023-05-15 DIAGNOSIS — M25651 Stiffness of right hip, not elsewhere classified: Secondary | ICD-10-CM | POA: Insufficient documentation

## 2023-05-15 DIAGNOSIS — M6281 Muscle weakness (generalized): Secondary | ICD-10-CM | POA: Diagnosis not present

## 2023-05-15 DIAGNOSIS — M5459 Other low back pain: Secondary | ICD-10-CM | POA: Insufficient documentation

## 2023-05-15 NOTE — Therapy (Signed)
OUTPATIENT PHYSICAL THERAPY THORACOLUMBAR TREATMENT PHYSICAL THERAPY DISCHARGE SUMMARY  Visits from Start of Care: 7  Current functional level related to goals / functional outcomes: As indicated below:     Remaining deficits: Pt with no pain deficit.  Pt with occassional Right lateral head gastroc head discomfort   Education / Equipment: HEP,, Resources online,  Equimpment to purchase   Patient agrees to discharge. Patient goals were met. Patient is being discharged due to meeting the stated rehab goals.  And being pleased with current level of function.    Patient Name: ZILA LEAS MRN: [id number] DOB:November 12, 1955, 67 y.o., female Today's Date: 05/20/2023  END OF SESSION:  PT End of Session - 05/20/23 1335     Visit Number 7    Number of Visits 9    Date for PT Re-Evaluation 05/22/23    Authorization Type [employer]    PT Start Time 1330    PT Stop Time 1417    PT Time Calculation (min) 47 min    Activity Tolerance Patient tolerated treatment well    Behavior During Therapy [facility] for tasks assessed/performed                   Past Medical History:  Diagnosis Date   Dyspareunia    Endometriosis    Past Surgical History:  Procedure Laterality Date   BICEPT TENODESIS Right 07/04/2021   Procedure: BICEPS TENODESIS;  Surgeon: Bjorn Pippin, MD;  Location: [facility];  Service: Orthopedics;  Laterality: Right;   CESAREAN SECTION  1991   LAPAROSCOPY  1989   OVARIAN CYST REMOVAL  1988   benign- endometrioma- laparatomy then danazol 6 months   SHOULDER ACROMIOPLASTY Right 07/04/2021   Procedure: SHOULDER ACROMIOPLASTY;  Surgeon: Bjorn Pippin, MD;  Location: [facility];  Service: Orthopedics;  Laterality: Right;   SHOULDER ARTHROSCOPY Right 07/04/2021   Procedure: ARTHROSCOPY SHOULDER;  Surgeon: Bjorn Pippin, MD;  Location: [facility];  Service: Orthopedics;  Laterality: Right;   SPINAL FUSION  2006   C5-6  rupture   Patient Active Problem List   Diagnosis Date Noted   Osteopenia 08/29/2020   Primary insomnia 08/29/2020   History of colonic polyps 08/28/2020   Vitamin D deficiency 08/28/2020   Hormone replacement therapy (HRT) 02/13/2017   Right foot pain 02/22/2013   Lateral epicondylitis 12/26/2009    PCP: Lorenda Ishihara, MD  REFERRING PROVIDER: Andi Devon, DO    REFERRING DIAG: M54.31 (ICD-10-CM) - Sciatica, right side   Rationale for Evaluation and Treatment: Rehabilitation  THERAPY DIAG:  Stiffness of right hip, not elsewhere classified  Muscle weakness (generalized)  Other low back pain  ONSET DATE: Chronic  SUBJECTIVE:  SUBJECTIVE STATEMENT: Pt reports that they have a new mattress and since that time I was able to sleep in another room. I feel worse in the morning with pain. Pt presents to PT  with varying pain.  I am not running and jumping like I would like ( bounding up stairs or play tennis).  I can jump rope in the same place.  Sometimes I have burning pain in my Right calf.  I have had nerve pain with my cervical fusion.    EVAL-Pt presents to PT with reports of subacute posterior R hip pain and paresthesias that extend to lateral R knee. Denies overt LBP, notes pain is not constant and is alleviated with movement and exercise. Is very active doing boot camp exercise 2-3x/wk and was regularly playing tennis until recent onset of R LE pain. Denies bowel/bladder changes or saddle anesthesia.   PERTINENT HISTORY:  C5-6 spinal fusion  PAIN:  Are you having pain?  Yes: NPRS scale: 1/10 Worst: 3/10 Pain location: Posterior R LE Pain description: sharp Aggravating factors: prolonged sitting Relieving factors: movement, exercise   PRECAUTIONS: None  RED  FLAGS: None   WEIGHT BEARING RESTRICTIONS: No  FALLS:  Has patient fallen in last 6 months? No  LIVING ENVIRONMENT: Lives with: lives with their family Lives in: House/apartment Stairs: No issue Has following equipment at home: None  OCCUPATION: Not currently working  PLOF: Independent  PATIENT GOALS: decrease pain/paresthesias in R LE, get back to playing tennis  NEXT MD VISIT: PRN  OBJECTIVE:  Note: Objective measures were completed at Evaluation unless otherwise noted.  DIAGNOSTIC FINDINGS:  N/A  PATIENT SURVEYS:  FOTO: 47% function; 66% predicted  COGNITION: Overall cognitive status: Within functional limits for tasks assessed     SENSATION: WFL  MUSCLE LENGTH: Hamstrings: Right WFL deg; Left WFL deg Thomas test: Right (-) deg; Left (-) deg  POSTURE: No Significant postural limitations  PALPATION: TTP to R piriformis (in FAIR test position)  LUMBAR ROM:   AROM eval  Flexion WFL  Extension WFL  Right lateral flexion   Left lateral flexion   Right rotation   Left rotation    (Blank rows = not tested)  LOWER EXTREMITY ROM:     Active  Right eval Left eval Right 05-20-23  Hip flexion     Hip extension     Hip abduction     Hip adduction     Hip internal rotation 18 36 27  Hip external rotation     Knee flexion     Knee extension     Ankle dorsiflexion     Ankle plantarflexion     Ankle inversion     Ankle eversion      (Blank rows = not tested)  LOWER EXTREMITY MMT:    MMT Right eval Left eval R/L 05-20-23  Hip flexion     Hip extension     Hip abduction 4/5 4/5 5/5  Hip adduction     Hip internal rotation     Hip external rotation     Knee flexion     Knee extension     Ankle dorsiflexion     Ankle plantarflexion     Ankle inversion     Ankle eversion      (Blank rows = not tested)  LUMBAR SPECIAL TESTS:  Straight leg raise test: Negative, Slump test: Negative, and FAIR positive  FUNCTIONAL TESTS:  5 times sit to  stand: 11 seconds  GAIT: Distance walked:  68ft Assistive device utilized: None Level of assistance: Complete Independence Comments: no overt deviations  TREATMENT: [facility] Adult PT Treatment:                                                DATE: 05-20-23 Therapeutic Exercise: Reviewed HEP and added exercises from You tube  E 3 Rehab Handwritten exercise for increasing knee flexing in squatting to include sitting on feet with decreased discomfort. Begin in tall kneeling and sitting with towel roll at knee flexion crease for self mob Knee flexion inward, forward and outward with power band around ankle for Self mob with green power band Slider work with single limb stand and slider work to forward , side and backward for more isotonic work Review HEP already given in handout previously Manual Therapy: STW Right gastroc R fibular head mobs PA grade III Trigger Point Dry-Needling performed     by Garen Lah Treatment instructions: Expect mild to moderate muscle soreness. S/S of pneumothorax if dry needled over a lung field, and to seek immediate medical attention should they occur. Patient verbalized understanding of these instructions and education.  Patient Consent Given: Yes Education handout provided: Previously provided Muscles treated: Right  lateral proximal gastroc Electrical stimulation performed:NO Parameters: NA Treatment response/outcome: twitch response noted, pt noted relief  [facility] Adult PT Treatment:                                                DATE: 05-15-23 Hamstring work Therapeutic Exercise: Standing Radial Nerve Glide  - 1 x daily - 7 x weekly - 1-2 sets - 10 reps Ulnar Nerve Tensioner  - 1 x daily - 7 x weekly - 1-2 sets - 10 reps Median Nerve Tensioner  - 1 x daily - 7 x weekly - 3 sets - 10 reps Single Leg Deadlift with Kettlebell   10 x with 10 # DB Bridge Walk Out x 10 Reviewed HEP  Manual Therapy: Trigger Point Dry-Needling performed     by Garen Lah Treatment instructions: Expect mild to moderate muscle soreness. S/S of pneumothorax if dry needled over a lung field, and to seek immediate medical attention should they occur. Patient verbalized understanding of these instructions and education.  Patient Consent Given: Yes Education handout provided: Previously provided Muscles treated: Right  lateral proximal gastroc Electrical stimulation performed: Yes Parameters: 25 pps  to pt tolerance increasing every 2 min until end of session for 8 minutes Treatment response/outcome: twitch response noted, pt noted relief  [facility] Adult PT Treatment:                                                DATE: 04-29-23 Therapeutic Exercise: Heel raise 30 x on L and R Resisted knee ext with blue T band   too challenging Resisted knee ext with green t band  4 x 12 on Right Resisted knee flexion with Green t band 4 x 12 on right and Left Standing ITB stretch 2x30" R Seated hamstring stretch 2x30" R Manual Therapy: Skilled palpation of trigger points for TPDN STM to  lumbar paraspinals Trigger Point Dry Needling Treatment: Pre-treatment instruction: Patient instructed on dry needling rationale, procedures, and possible side effects including pain during treatment (achy,cramping feeling), bruising, drop of blood, lightheadedness, nausea, sweating. Patient Consent Given: Yes Education handout provided: No Muscles treated: R Gastroc  lateral head Needle size and number: .30x13mm x 2;  Electrical stimulation performed: No Parameters: N/A Treatment response/outcome: Twitch response elicited and Palpable decrease in muscle tension Post-treatment instructions: Patient instructed to expect possible mild to moderate muscle soreness later today and/or tomorrow. Patient instructed in methods to reduce muscle soreness and to continue prescribed HEP. If patient was dry needled over the lung field, patient was instructed on signs and symptoms of pneumothorax and,  however unlikely, to see immediate medical attention should they occur. Patient was also educated on signs and symptoms of infection and to seek medical attention should they occur. Patient verbalized understanding of these instructions and education.   Self Care: education on progressive overload and how to achieve muscle hypertrophy,  Strength over stretching for pain mananagement   [facility] Adult PT Treatment:                                                DATE: 04/15/2023 Therapeutic Exercise: ITB stretch with strap 2x30" R Supine hamstring stretch with strap 2x30" R Standing ITB stretch 2x30" R Seated hamstring stretch 2x30" R McConnell tape applied to R knee with lateral glide  Manual Therapy: Skilled palpation of trigger points for TPDN STM to lumbar paraspinals Trigger Point Dry Needling Treatment: Pre-treatment instruction: Patient instructed on dry needling rationale, procedures, and possible side effects including pain during treatment (achy,cramping feeling), bruising, drop of blood, lightheadedness, nausea, sweating. Patient Consent Given: Yes Education handout provided: No Muscles treated: R lumbar multifidi, R vastus lateralis, R TFL  Needle size and number: .30x105mm x 2; .30x52mm x 3 Electrical stimulation performed: No Parameters: N/A Treatment response/outcome: Twitch response elicited and Palpable decrease in muscle tension Post-treatment instructions: Patient instructed to expect possible mild to moderate muscle soreness later today and/or tomorrow. Patient instructed in methods to reduce muscle soreness and to continue prescribed HEP. If patient was dry needled over the lung field, patient was instructed on signs and symptoms of pneumothorax and, however unlikely, to see immediate medical attention should they occur. Patient was also educated on signs and symptoms of infection and to seek medical attention should they occur. Patient verbalized understanding of these  instructions and education.   [facility] Adult PT Treatment:                                                DATE: 04-08-23 Therapeutic Exercise: Resisted R knee with RTB Resisted R Knee flexion RTB Kickstand dead lift  Updated HEP Manual Therapy:  lumbar paraspinal, R gluts, R piriformis.  R  lateral hamstring LAD R Trigger Point Dry-Needling performed Treatment instructions: Expect mild to moderate muscle soreness. S/S of pneumothorax if dry needled over a lung field, and to seek immediate medical attention should they occur. Patient verbalized understanding of these instructions and education.  Patient Consent Given: Yes Education handout provided: Previously provided Muscles treated:   lateral hamstiring  Right Electrical stimulation performed: No Parameters: N/A Treatment response/outcome: twitch response  noted, pt noted relief  Modalities: .iontophoresis patch  to be removed 4-6 hours.1 cc dexamethason ot Right lateral knee  [facility] Adult PT Treatment:                                                DATE: 04-02-23 Therapeutic Exercise: Seated sciatic nerve glide x 5 R Supine sciatic nerve glide x 5 R  Supine piriformis stretch x 30" R Clamshell x 10  GTB R S/L hip abd  4 x 5 each with VC and TC IT band stretch with green strap 2 x 30 sec Manual Therapy: STW-  lumbar paraspinal, R gluts, R piriformis.  R quad/ vastus lateralis and rectus femoris distal end  LAD R  Testing of R knee LCL with positive pain Trigger Point Dry-Needling performed     by Garen Lah Treatment instructions: Expect mild to moderate muscle soreness. S/S of pneumothorax if dry needled over a lung field, and to seek immediate medical attention should they occur. Patient verbalized understanding of these instructions and education.  Patient Consent Given: Yes Education handout provided: Previously provided Muscles treated:   lumbar paraspinal, R gluts, R piriformis.  R quad/ vastus lateralis and rectus  femoris distal end Electrical stimulation performed: No Parameters: N/A Treatment response/outcome: twitch response noted, pt noted relief  [facility] Adult PT Treatment:                                                DATE: 03/27/2023 Therapeutic Exercise: Seated sciatic nerve glide x 5 R Supine sciatic nerve glide x 5 R  Supine piriformis stretch x 30" R Clamshell x 5 GTB R S/L hip abd x 5  PATIENT EDUCATION:  Education details: eval findings, FOTO, HEP, POC Person educated: Patient Education method: Explanation, Demonstration, and Handouts Education comprehension: verbalized understanding and returned demonstration  HOME EXERCISE PROGRAM: Access Code: [email or web address] URL: [email or web address] Date: 04/16/2023 Prepared by: Edwinna Areola  Program Notes For hip abduction roll hip forward and touch foot to wall for exercise  Exercises - Seated Sciatic Tensioner  - 1 x daily - 7 x weekly - 2 sets - 15 reps - Supine Sciatic Nerve Glide  - 1 x daily - 7 x weekly - 2 sets - 15 reps - Supine Figure 4 Piriformis Stretch  - 1 x daily - 7 x weekly - 3 sets - 2 reps - 30 sec hold - Supine Piriformis Stretch with Leg Straight  - 1 x daily - 7 x weekly - 2 reps - 30 sec hold - Clamshell with Resistance  - 3-4 x weekly - 2-3 sets - 15 reps - green band hold - Sidelying Hip Abduction  - 3-4 x weekly - 3 sets - 10 reps - Supine ITB Stretch with Strap  - 1 x daily - 7 x weekly - 1 sets - 3 reps - 30-60 hold - pigeon stretch on chair  - 1 x daily - 7 x weekly - 1 sets - 3 reps - 30 sec -60 sec  hold - Seated Knee Extension with Resistance  - 1 x daily - 7 x weekly - 3 sets - 10 reps - Standing Hamstring Curl with Resistance  - 1 x daily -  7 x weekly - 3 sets - 10 reps - Supine ITB Stretch with Strap  - 1 x daily - 7 x weekly - 2 reps - 30 sec hold - ITB Stretch at Wall  - 1 x daily - 7 x weekly - 2 reps - 30 sec hold - Supine Hamstring Stretch with Strap  - 1 x daily - 7 x weekly - 2 reps -  30 sec hold - Seated Hamstring Stretch  - 1 x daily - 7 x weekly - 2 reps - 30 sec hold Added to HEP - Standing Radial Nerve Glide  - 1 x daily - 7 x weekly - 1-2 sets - 10 reps - Ulnar Nerve Tensioner  - 1 x daily - 7 x weekly - 1-2 sets - 10 reps - Median Nerve Tensioner  - 1 x daily - 7 x weekly - 3 sets - 10 reps - Single Leg Deadlift with Kettlebell  - 1 x daily - 7 x weekly - 3 sets - 10 reps - Bridge Walk Out  - 1 x daily - 7 x weekly - 3 sets - 10 reps - Kneeling Eccentric Hamstring Strengthening with Caregiver  - 1 x daily - 7 x weekly - 3 sets - 10 reps  CLINICAL IMPRESSION: Pt returns with no real pain issues and has occassional discomfort on Right lateral gastroc.  Pt with slightly decreased AROM of R DF vs Left DF but is able to sit on knees with increased comfort post TPDN and stretching.  Pt consented to TPDN and was closely monitored throughout session  by PT.  Pt with decreased tissue tension and was able to perform more challenging exercises post TPDN.  PT FOTO 94% and all LTG achieved today in therapy.  Pt is pleased with current level of function and should do well independently and attends a boot camp regularly for exercise. Pt with 0/10 back pain/sciatica.  Pt left with no adverse effect, noting decrease in pain post TPDN.   Pt is ready for DC  EVAL- Patient is a 67 y.o. F who was seen today for physical therapy evaluation and treatment for acute on chronic lower back pain with referral into R LE. Physical findings are consistent with physician impression as pt demonstrates adverse neural tensioning and decreased core/hip strength and functional mobility. FOTO score shows subjective decrease in functional ability below PLOF. Pt would benefit from skilled PT services working on decreasing back and R LE pain and discomfort.    OBJECTIVE IMPAIRMENTS: decreased activity tolerance, decreased mobility, decreased ROM, decreased strength, and pain.   ACTIVITY LIMITATIONS: standing,  squatting, stairs, transfers, and locomotion level  PARTICIPATION LIMITATIONS: driving, shopping, community activity, yard work, and tennis  PERSONAL FACTORS: Fitness are also affecting patient's functional outcome.   REHAB POTENTIAL: Excellent  CLINICAL DECISION MAKING: Stable/uncomplicated  EVALUATION COMPLEXITY: Low   GOALS: Goals reviewed with patient? No  SHORT TERM GOALS: Target date: 04/17/2023   Pt will be compliant and knowledgeable with initial HEP for improved comfort and carryover Baseline: initial HEP given  Goal status: MET  LONG TERM GOALS: Target date: 05/22/2023   Pt will improve FOTO function score to no less than 66% as proxy for functional improvement Baseline: 47% function Status 05-20-23  94% Goal status: MET  2.  Pt will self report R LE pain no greater than 1/10 for improved comfort and functional ability Baseline: 3/10 at worst Goal status:MET  3.  Pt will improve R hip IR ROM  to no less than 25 degrees for improved muscle length in proximal hip for decreased pain Baseline: 18 degrees STatus  27 degrees Goal status: MET  4.  Pt will improve bilateral hip abduction MMT to no less than 5/5 for improved comfort and functional mobility Baseline:  Goal status:MET   PLAN:  PT FREQUENCY: 1x/week  PT DURATION: 8 weeks  PLANNED INTERVENTIONS: 97164- PT Re-evaluation, 97110-Therapeutic exercises, 97530- Therapeutic activity, [id number]- Neuromuscular re-education, 97535- Self Care, [id number]- Manual therapy, [id number]- Gait training, [id number]- Aquatic Therapy, 97016- Vasopneumatic device, Dry Needling, Cryotherapy, and Moist heat.  PLAN FOR NEXT SESSION: assess HEP response, TPDN to R piriformis and glute med, lateral hip strengthening/stretching  Garen Lah, PT, A[facility] Certified Exercise Expert for the Aging Adult  05/20/23 2:39 PM Phone: [phone] Fax: [phone]

## 2023-05-20 ENCOUNTER — Ambulatory Visit: Payer: 59 | Admitting: Physical Therapy

## 2023-05-20 DIAGNOSIS — M6281 Muscle weakness (generalized): Secondary | ICD-10-CM | POA: Diagnosis not present

## 2023-05-20 DIAGNOSIS — M5459 Other low back pain: Secondary | ICD-10-CM

## 2023-05-20 DIAGNOSIS — M25651 Stiffness of right hip, not elsewhere classified: Secondary | ICD-10-CM | POA: Diagnosis not present

## 2023-05-20 NOTE — Patient Instructions (Signed)
E 3 Rehab      [email or web address]

## 2023-05-29 ENCOUNTER — Ambulatory Visit: Payer: 59 | Admitting: Physical Therapy

## 2023-06-04 ENCOUNTER — Other Ambulatory Visit: Payer: Self-pay | Admitting: Family Medicine

## 2023-06-04 ENCOUNTER — Other Ambulatory Visit (HOSPITAL_COMMUNITY): Payer: Self-pay

## 2023-06-05 ENCOUNTER — Other Ambulatory Visit (HOSPITAL_COMMUNITY): Payer: Self-pay

## 2023-06-05 ENCOUNTER — Other Ambulatory Visit: Payer: Self-pay

## 2023-06-11 ENCOUNTER — Other Ambulatory Visit (HOSPITAL_COMMUNITY): Payer: Self-pay

## 2023-06-17 ENCOUNTER — Other Ambulatory Visit (HOSPITAL_COMMUNITY): Payer: Self-pay

## 2023-06-24 ENCOUNTER — Other Ambulatory Visit (HOSPITAL_COMMUNITY): Payer: Self-pay

## 2023-06-24 ENCOUNTER — Other Ambulatory Visit: Payer: Self-pay | Admitting: Neurosurgery

## 2023-06-24 DIAGNOSIS — M47812 Spondylosis without myelopathy or radiculopathy, cervical region: Secondary | ICD-10-CM | POA: Diagnosis not present

## 2023-06-24 DIAGNOSIS — M5440 Lumbago with sciatica, unspecified side: Secondary | ICD-10-CM | POA: Diagnosis not present

## 2023-06-24 MED ORDER — CELECOXIB 200 MG PO CAPS
200.0000 mg | ORAL_CAPSULE | Freq: Every day | ORAL | 0 refills | Status: DC | PRN
  Filled 2023-06-24: qty 60, 60d supply, fill #0

## 2023-07-02 DIAGNOSIS — M5440 Lumbago with sciatica, unspecified side: Secondary | ICD-10-CM | POA: Diagnosis not present

## 2023-07-02 DIAGNOSIS — M47812 Spondylosis without myelopathy or radiculopathy, cervical region: Secondary | ICD-10-CM | POA: Diagnosis not present

## 2023-07-07 DIAGNOSIS — J029 Acute pharyngitis, unspecified: Secondary | ICD-10-CM | POA: Diagnosis not present

## 2023-07-07 DIAGNOSIS — L209 Atopic dermatitis, unspecified: Secondary | ICD-10-CM | POA: Diagnosis not present

## 2023-07-09 ENCOUNTER — Other Ambulatory Visit: Payer: 59

## 2023-07-10 DIAGNOSIS — L209 Atopic dermatitis, unspecified: Secondary | ICD-10-CM | POA: Diagnosis not present

## 2023-07-14 ENCOUNTER — Ambulatory Visit
Admission: RE | Admit: 2023-07-14 | Discharge: 2023-07-14 | Disposition: A | Discharge status: Home / Self Care | Payer: 59 | Source: Ambulatory Visit | Attending: Neurosurgery | Admitting: Neurosurgery

## 2023-07-14 DIAGNOSIS — M5440 Lumbago with sciatica, unspecified side: Secondary | ICD-10-CM | POA: Diagnosis not present

## 2023-07-14 DIAGNOSIS — Z981 Arthrodesis status: Secondary | ICD-10-CM | POA: Diagnosis not present

## 2023-07-14 DIAGNOSIS — M4802 Spinal stenosis, cervical region: Secondary | ICD-10-CM | POA: Diagnosis not present

## 2023-07-14 DIAGNOSIS — M47812 Spondylosis without myelopathy or radiculopathy, cervical region: Secondary | ICD-10-CM

## 2023-07-16 DIAGNOSIS — M5440 Lumbago with sciatica, unspecified side: Secondary | ICD-10-CM | POA: Diagnosis not present

## 2023-07-16 DIAGNOSIS — M47812 Spondylosis without myelopathy or radiculopathy, cervical region: Secondary | ICD-10-CM | POA: Diagnosis not present

## 2023-07-18 DIAGNOSIS — M25561 Pain in right knee: Secondary | ICD-10-CM | POA: Diagnosis not present

## 2023-07-21 DIAGNOSIS — M47812 Spondylosis without myelopathy or radiculopathy, cervical region: Secondary | ICD-10-CM | POA: Diagnosis not present

## 2023-07-21 DIAGNOSIS — M5440 Lumbago with sciatica, unspecified side: Secondary | ICD-10-CM | POA: Diagnosis not present

## 2023-07-22 DIAGNOSIS — M25561 Pain in right knee: Secondary | ICD-10-CM | POA: Diagnosis not present

## 2023-07-23 DIAGNOSIS — M47812 Spondylosis without myelopathy or radiculopathy, cervical region: Secondary | ICD-10-CM | POA: Diagnosis not present

## 2023-07-23 DIAGNOSIS — M5440 Lumbago with sciatica, unspecified side: Secondary | ICD-10-CM | POA: Diagnosis not present

## 2023-07-28 DIAGNOSIS — M47812 Spondylosis without myelopathy or radiculopathy, cervical region: Secondary | ICD-10-CM | POA: Diagnosis not present

## 2023-07-28 DIAGNOSIS — M5440 Lumbago with sciatica, unspecified side: Secondary | ICD-10-CM | POA: Diagnosis not present

## 2023-07-29 DIAGNOSIS — M5440 Lumbago with sciatica, unspecified side: Secondary | ICD-10-CM | POA: Diagnosis not present

## 2023-07-29 DIAGNOSIS — M47812 Spondylosis without myelopathy or radiculopathy, cervical region: Secondary | ICD-10-CM | POA: Diagnosis not present

## 2023-08-04 DIAGNOSIS — M47812 Spondylosis without myelopathy or radiculopathy, cervical region: Secondary | ICD-10-CM | POA: Diagnosis not present

## 2023-08-04 DIAGNOSIS — M5440 Lumbago with sciatica, unspecified side: Secondary | ICD-10-CM | POA: Diagnosis not present

## 2023-08-13 ENCOUNTER — Other Ambulatory Visit (HOSPITAL_COMMUNITY): Payer: Self-pay

## 2023-08-13 DIAGNOSIS — H00011 Hordeolum externum right upper eyelid: Secondary | ICD-10-CM | POA: Diagnosis not present

## 2023-08-13 MED ORDER — DOXYCYCLINE HYCLATE 100 MG PO CAPS
100.0000 mg | ORAL_CAPSULE | Freq: Two times a day (BID) | ORAL | 0 refills | Status: DC
  Filled 2023-08-13: qty 20, 10d supply, fill #0

## 2023-08-13 MED ORDER — NEOMYCIN-POLYMYXIN-DEXAMETH 3.5-10000-0.1 OP OINT
1.0000 | TOPICAL_OINTMENT | Freq: Two times a day (BID) | OPHTHALMIC | 0 refills | Status: AC
  Filled 2023-08-13: qty 3.5, 7d supply, fill #0

## 2023-08-14 ENCOUNTER — Other Ambulatory Visit: Payer: Self-pay

## 2023-08-14 ENCOUNTER — Encounter (HOSPITAL_BASED_OUTPATIENT_CLINIC_OR_DEPARTMENT_OTHER): Payer: Self-pay | Admitting: Orthopaedic Surgery

## 2023-08-20 NOTE — H&P (Signed)
 PREOPERATIVE H&P  Chief Complaint: MEDIAL AND LATERAL MENISCUS TEARS, RIGHT KNEE  HPI: Rachel Beasley is a 68 y.o. female who is scheduled for, Procedure(s): KNEE ARTHROSCOPY WITH MEDIAL AND LATERAL  MENISECTOMIES.   Patient has had right knee pain since an injury at her daughter's wedding. She had immediate pain in the lateral aspect of her knee. She has been unable to fully straighten. She has catching and locking. She is unable to play tennis currently.   Symptoms are rated as moderate to severe, and have been worsening.  This is significantly impairing activities of daily living.    Please see clinic note for further details on this patient's care.    She has elected for surgical management.   Past Medical History:  Diagnosis Date   Arthritis    Dyspareunia    Endometriosis    Past Surgical History:  Procedure Laterality Date   BICEPT TENODESIS Right 07/04/2021   Procedure: BICEPS TENODESIS;  Surgeon: Bjorn Pippin, MD;  Location: [facility];  Service: Orthopedics;  Laterality: Right;   CESAREAN SECTION  1991   LAPAROSCOPY  1989   OVARIAN CYST REMOVAL  1988   benign- endometrioma- laparatomy then danazol 6 months   SHOULDER ACROMIOPLASTY Right 07/04/2021   Procedure: SHOULDER ACROMIOPLASTY;  Surgeon: Bjorn Pippin, MD;  Location: [facility];  Service: Orthopedics;  Laterality: Right;   SHOULDER ARTHROSCOPY Right 07/04/2021   Procedure: ARTHROSCOPY SHOULDER;  Surgeon: Bjorn Pippin, MD;  Location: [facility];  Service: Orthopedics;  Laterality: Right;   SPINAL FUSION  2006   C5-6 rupture   Social History   Socioeconomic History   Marital status: Married    Spouse name: Not on file   Number of children: Not on file   Years of education: Not on file   Highest education level: Not on file  Occupational History   Not on file  Tobacco Use   Smoking status: Never   Smokeless tobacco: Never  Vaping Use   Vaping status: Never  Used  Substance and Sexual Activity   Alcohol use: Yes    Alcohol/week: 0.0 - 2.0 standard drinks of alcohol   Drug use: No   Sexual activity: Yes    Partners: Male    Birth control/protection: Post-menopausal  Other Topics Concern   Not on file  Social History Narrative   Not on file   Social Drivers of Health   Financial Resource Strain: Not on file  Food Insecurity: Not on file  Transportation Needs: Not on file  Physical Activity: Not on file  Stress: Not on file  Social Connections: Not on file   Family History  Problem Relation Age of Onset   Diabetes Maternal Grandfather    Heart disease Maternal Grandfather    Polymyalgia rheumatica Father    Renal Disease Father    Hypertension Father    Stroke Father        with memory loss   Heart disease Paternal Grandfather    Hypertension Mother    Hyperlipidemia Mother    Stroke Mother    No Known Allergies Prior to Admission medications   Medication Sig Start Date End Date Taking? Authorizing Provider  celecoxib (CELEBREX) 200 MG capsule Take 1 capsule (200 mg total) by mouth 2 (two) times daily. 03/24/23  Yes Christella Hartigan, Bret C, DO  doxycycline (VIBRAMYCIN) 100 MG capsule Take 1 capsule (100 mg total) by mouth 2 (two) times daily. 08/13/23  Yes   estradiol (VIVELLE-DOT) 0.025 MG/24HR Place 1 patch onto the skin 2 (two) times a week. 09/05/22  Yes Jerene Bears, MD  progesterone (PROMETRIUM) 100 MG capsule Take 2 capsules nightly. 09/05/22  Yes Jerene Bears, MD  zolpidem (AMBIEN) 5 MG tablet Take 1 tablet (5 mg total) by mouth at bedtime as needed for sleep. 09/05/22  Yes Jerene Bears, MD  Calcium Carbonate-Vitamin D (CALCIUM 500 + D PO) Take 1 tablet by mouth daily.    [provider]  celecoxib (CELEBREX) 200 MG capsule Take 1 capsule (200 mg total) by mouth daily as needed. 06/24/23     EPINEPHrine (EPIPEN 2-PAK) 0.3 mg/0.3 mL IJ SOAJ injection Inject once as needed as directed 12/05/21     EPINEPHrine (EPIPEN 2-PAK)  0.3 mg/0.3 mL IJ SOAJ injection Use as directed as needed for anaphylaxis. 12/10/22     EPINEPHrine 0.3 mg/0.3 mL IJ SOAJ injection SMARTSIG:0.3 Milliliter(s) IM Once PRN 07/12/19   [provider]  ergocalciferol (DRISDOL) 1.25 MG (50000 UT) capsule Take 1 capsule (50,000 Units total) by mouth once a week with dinner 12/11/22     neomycin-polymyxin b-dexamethasone (MAXITROL) 3.5-10000-0.1 OINT Place 1 Application to affected eyelid(s) 2 (two) times daily as directed. 08/13/23       ROS: All other systems have been reviewed and were otherwise negative with the exception of those mentioned in the HPI and as above.  Physical Exam: General: Alert, no acute distress Cardiovascular: No pedal edema Respiratory: No cyanosis, no use of accessory musculature GI: No organomegaly, abdomen is soft and non-tender Skin: No lesions in the area of chief complaint Neurologic: Sensation intact distally Psychiatric: Patient is competent for consent with normal mood and affect Lymphatic: No axillary or cervical lymphadenopathy  MUSCULOSKELETAL:  Right knee: Pain with full flexion. Lateral joint line tenderness.   Imaging: MRI demonstrates medial and lateral meniscus tears  Assessment: MEDIAL AND LATERAL MENISCUS TEARS, RIGHT KNEE  Plan: Plan for Procedure(s): KNEE ARTHROSCOPY WITH MEDIAL AND LATERAL  MENISECTOMIES  The risks benefits and alternatives were discussed with the patient including but not limited to the risks of nonoperative treatment, versus surgical intervention including infection, bleeding, nerve injury,  blood clots, cardiopulmonary complications, morbidity, mortality, among others, and they were willing to proceed.   The patient acknowledged the explanation, agreed to proceed with the plan and consent was signed.   Operative Plan: Right knee scope with medial and lateral meniscectomies Discharge Medications: standard DVT Prophylaxis: aspirin Physical Therapy: outpatient  PT Special Discharge needs: +/-   Vernetta Honey, PA-C  08/20/2023 12:07 PM

## 2023-08-20 NOTE — Anesthesia Preprocedure Evaluation (Signed)
 Anesthesia Evaluation  Patient identified by MRN, date of birth, ID band Patient awake    Reviewed: Allergy & Precautions, NPO status , Patient's Chart, lab work & pertinent test results  History of Anesthesia Complications Negative for: history of anesthetic complications  Airway Mallampati: II  TM Distance: >3 FB Neck ROM: Full    Dental no notable dental hx.    Pulmonary neg pulmonary ROS   Pulmonary exam normal        Cardiovascular negative cardio ROS Normal cardiovascular exam     Neuro/Psych S/P ACDF  negative psych ROS   GI/Hepatic negative GI ROS, Neg liver ROS,,,  Endo/Other  negative endocrine ROS    Renal/GU negative Renal ROS  negative genitourinary   Musculoskeletal  (+) Arthritis ,  MEDIAL AND LATERAL MENISCUS TEARS, RIGHT KNEE   Abdominal   Peds  Hematology negative hematology ROS (+)   Anesthesia Other Findings Day of surgery medications reviewed with patient.  Reproductive/Obstetrics negative OB ROS                              Anesthesia Physical Anesthesia Plan  ASA: 1  Anesthesia Plan: General   Post-op Pain Management: Tylenol PO (pre-op)*   Induction: Intravenous  PONV Risk Score and Plan: 3 and Dexamethasone, Ondansetron, Treatment may vary due to age or medical condition and Midazolam  Airway Management Planned: LMA  Additional Equipment: None  Intra-op Plan:   Post-operative Plan: Extubation in OR  Informed Consent: I have reviewed the patients History and Physical, chart, labs and discussed the procedure including the risks, benefits and alternatives for the proposed anesthesia with the patient or authorized representative who has indicated his/her understanding and acceptance.     Dental advisory given  Plan Discussed with: CRNA  Anesthesia Plan Comments:         Anesthesia Quick Evaluation

## 2023-08-20 NOTE — Discharge Instructions (Signed)
 Ramond Marrow MD, MPH Alfonse Alpers, PA-C [facility] [address], [address] [phone] (tel)   [phone] (fax)   POST-OPERATIVE INSTRUCTIONS - Knee Arthroscopy  WOUND CARE - You may remove the Operative Dressing on Post-Op Day #3 (72hrs after surgery).   -  Alternatively if you would like you can leave dressing on until follow-up if within 7-8 days but keep it dry. - Leave steri-strips in place until they fall off on their own, usually 2 weeks postop. - An ACE wrap may be used to control swelling, do not wrap this too tight.  If the initial ACE wrap feels too tight you may loosen it. - There may be a small amount of fluid/bleeding leaking at the surgical site.  - This is normal; the knee is filled with fluid during the procedure and can leak for 24-48hrs after surgery. You may change/reinforce the bandage as needed.  - Use the Cryocuff or Ice as often as possible for the first 7 days, then as needed for pain relief. Always keep a towel, ACE wrap or other barrier between the cooling unit and your skin.  - You may shower on Post-Op Day #3. Gently pat the area dry.  - Do not soak the knee in water or submerge it.  - Do not go swimming in the pool or ocean until 4 weeks after surgery or when otherwise instructed.  Keep dry incisions as dry as possible.   BRACE/AMBULATION  -            You will not need a brace after this procedure.   - You may use crutches initially to help you weight bear, but this is not required - You can put full weight on your operative leg as you feel comfortable  REGIONAL ANESTHESIA (NERVE BLOCKS) The anesthesia team may have performed a nerve block for you this is a great tool used to minimize pain.   The block may start wearing off overnight (between 8-24 hours postop) When the block wears off, your pain may go from nearly zero to the pain you would have had postop without the block. This is an abrupt transition but nothing dangerous  is happening.   This can be a challenging period but utilize your as needed pain medications to try and manage this period. We suggest you use the pain medication the first night prior to going to bed, to ease this transition.  You may take an extra dose of narcotic when this happens if needed   POST-OP MEDICATIONS- Multimodal approach to pain control In general your pain will be controlled with a combination of substances.  Prescriptions unless otherwise discussed are electronically sent to your pharmacy.  This is a carefully made plan we use to minimize narcotic use.     Celebrex - Anti-inflammatory medication taken on a scheduled basis Acetaminophen - Non-narcotic pain medicine taken on a scheduled basis  Tramadol - This is a strong narcotic, to be used only on an "as needed" basis for SEVERE pain. Aspirin 81mg  - This medicine is used to minimize the risk of blood clots after surgery. Zofran - take as needed for nausea   FOLLOW-UP   Please call the office to schedule a follow-up appointment for your incision check, 7-10 days post-operatively.   IF YOU HAVE ANY QUESTIONS, PLEASE FEEL FREE TO CALL OUR OFFICE.   HELPFUL INFORMATION   Keep your leg elevated to decrease swelling, which will then in turn decrease your pain. I  would elevate the foot of your bed by putting a couple of couch pillows between your mattress and box spring. I would not keep pillow directly under your ankle.  - Do not sleep with a pillow behind your knee even if it is more comfortable as this may make it harder to get your knee fully straight long term.   There will be MORE swelling on days 1-3 than there is on the day of surgery.  This also is normal. The swelling will decrease with the anti-inflammatory medication, ice and keeping it elevated. The swelling will make it more difficult to bend your knee. As the swelling goes down your motion will become easier   You may develop swelling and bruising that  extends from your knee down to your calf and perhaps even to your foot over the next week. Do not be alarmed. This too is normal, and it is due to gravity   There may be some numbness adjacent to the incision site. This may last for 6-12 months or longer in some patients and is expected.   You may return to sedentary work/school in the next couple of days when you feel up to it. You will need to keep your leg elevated as much as possible    You should wean off your narcotic medicines as soon as you are able.  Most patients will be off narcotics before their first postop appointment.    We suggest you use the pain medication the first night prior to going to bed, in order to ease any pain when the anesthesia wears off. You should avoid taking pain medications on an empty stomach as it will make you nauseous.   Do not drink alcoholic beverages or take illicit drugs when taking pain medications.   It is against the law to drive while taking narcotics. You cannot drive if your Right leg is in brace locked in extension.   Pain medication may make you constipated.  Below are a few solutions to try in this order:  o Decrease the amount of pain medication if you aren't having pain.  o Drink lots of decaffeinated fluids.  o Drink prune juice and/or eat dried prunes   o If the first 3 don't work start with additional solutions  o Take Colace - an over-the-counter stool softener  o Take Senokot - an over-the-counter laxative  o Take Miralax - a stronger over-the-counter laxative    For more information including helpful videos and documents visit our website:   [email or web address]    Post Anesthesia Home Care Instructions  Activity: Get plenty of rest for the remainder of the day. A responsible individual must stay with you for 24 hours following the procedure.  For the next 24 hours, DO NOT: -Drive a car -Advertising copywriter -Drink alcoholic  beverages -Take any medication unless instructed by your physician -Make any legal decisions or sign important papers.  Meals: Start with liquid foods such as gelatin or soup. Progress to regular foods as tolerated. Avoid greasy, spicy, heavy foods. If nausea and/or vomiting occur, drink only clear liquids until the nausea and/or vomiting subsides. Call your physician if vomiting continues.  Special Instructions/Symptoms: Your throat may feel dry or sore from the anesthesia or the breathing tube placed in your throat during surgery. If this causes discomfort, gargle with warm salt water. The discomfort should disappear within 24 hours.  If you had a scopolamine patch placed behind your ear for the management of post- operative nausea  and/or vomiting:  1. The medication in the patch is effective for 72 hours, after which it should be removed.  Wrap patch in a tissue and discard in the trash. Wash hands thoroughly with soap and water. 2. You may remove the patch earlier than 72 hours if you experience unpleasant side effects which may include dry mouth, dizziness or visual disturbances. 3. Avoid touching the patch. Wash your hands with soap and water after contact with the patch.    Last dose of Tylenol given at 6:30am

## 2023-08-21 ENCOUNTER — Ambulatory Visit (HOSPITAL_BASED_OUTPATIENT_CLINIC_OR_DEPARTMENT_OTHER): Payer: Self-pay | Admitting: Anesthesiology

## 2023-08-21 ENCOUNTER — Encounter (HOSPITAL_BASED_OUTPATIENT_CLINIC_OR_DEPARTMENT_OTHER)
Admission: RE | Disposition: A | Discharge status: Home / Self Care | Payer: Self-pay | Source: Home / Self Care | Attending: Orthopaedic Surgery

## 2023-08-21 ENCOUNTER — Other Ambulatory Visit (HOSPITAL_COMMUNITY): Payer: Self-pay

## 2023-08-21 ENCOUNTER — Ambulatory Visit (HOSPITAL_BASED_OUTPATIENT_CLINIC_OR_DEPARTMENT_OTHER)
Admission: RE | Admit: 2023-08-21 | Discharge: 2023-08-21 | Disposition: A | Discharge status: Home / Self Care | Payer: 59 | Attending: Orthopaedic Surgery | Admitting: Orthopaedic Surgery

## 2023-08-21 ENCOUNTER — Encounter (HOSPITAL_BASED_OUTPATIENT_CLINIC_OR_DEPARTMENT_OTHER): Payer: Self-pay | Admitting: Orthopaedic Surgery

## 2023-08-21 ENCOUNTER — Other Ambulatory Visit: Payer: Self-pay

## 2023-08-21 DIAGNOSIS — S83271A Complex tear of lateral meniscus, current injury, right knee, initial encounter: Secondary | ICD-10-CM | POA: Insufficient documentation

## 2023-08-21 DIAGNOSIS — X58XXXA Exposure to other specified factors, initial encounter: Secondary | ICD-10-CM | POA: Diagnosis not present

## 2023-08-21 DIAGNOSIS — S83231A Complex tear of medial meniscus, current injury, right knee, initial encounter: Secondary | ICD-10-CM | POA: Diagnosis not present

## 2023-08-21 DIAGNOSIS — M1990 Unspecified osteoarthritis, unspecified site: Secondary | ICD-10-CM | POA: Insufficient documentation

## 2023-08-21 DIAGNOSIS — Z981 Arthrodesis status: Secondary | ICD-10-CM | POA: Insufficient documentation

## 2023-08-21 DIAGNOSIS — S83241A Other tear of medial meniscus, current injury, right knee, initial encounter: Secondary | ICD-10-CM

## 2023-08-21 DIAGNOSIS — S83281A Other tear of lateral meniscus, current injury, right knee, initial encounter: Secondary | ICD-10-CM

## 2023-08-21 HISTORY — PX: KNEE ARTHROSCOPY WITH MEDIAL MENISECTOMY: SHX5651

## 2023-08-21 HISTORY — DX: Unspecified osteoarthritis, unspecified site: M19.90

## 2023-08-21 SURGERY — ARTHROSCOPY, KNEE, WITH MEDIAL MENISCECTOMY
Anesthesia: General | Site: Knee | Laterality: Right

## 2023-08-21 MED ORDER — OXYCODONE HCL 5 MG PO TABS
5.0000 mg | ORAL_TABLET | Freq: Once | ORAL | Status: AC | PRN
  Administered 2023-08-21: 5 mg via ORAL

## 2023-08-21 MED ORDER — PROPOFOL 10 MG/ML IV BOLUS
INTRAVENOUS | Status: AC
  Filled 2023-08-21: qty 20

## 2023-08-21 MED ORDER — DEXAMETHASONE SODIUM PHOSPHATE 10 MG/ML IJ SOLN
INTRAMUSCULAR | Status: AC
  Filled 2023-08-21: qty 1

## 2023-08-21 MED ORDER — CEFAZOLIN SODIUM-DEXTROSE 2-4 GM/100ML-% IV SOLN
2.0000 g | INTRAVENOUS | Status: AC
  Administered 2023-08-21: 2 g via INTRAVENOUS

## 2023-08-21 MED ORDER — LACTATED RINGERS IV SOLN: INTRAVENOUS | Status: DC

## 2023-08-21 MED ORDER — MIDAZOLAM HCL 2 MG/2ML IJ SOLN
INTRAMUSCULAR | Status: AC
  Filled 2023-08-21: qty 2

## 2023-08-21 MED ORDER — GABAPENTIN 300 MG PO CAPS
ORAL_CAPSULE | ORAL | Status: AC
  Filled 2023-08-21: qty 1

## 2023-08-21 MED ORDER — DROPERIDOL 2.5 MG/ML IJ SOLN: 0.6250 mg | Freq: Once | INTRAMUSCULAR | Status: DC | PRN

## 2023-08-21 MED ORDER — ACETAMINOPHEN 500 MG PO TABS
1000.0000 mg | ORAL_TABLET | Freq: Once | ORAL | Status: AC
  Administered 2023-08-21: 1000 mg via ORAL

## 2023-08-21 MED ORDER — FENTANYL CITRATE (PF) 100 MCG/2ML IJ SOLN
25.0000 ug | INTRAMUSCULAR | Status: DC | PRN
  Administered 2023-08-21: 50 ug via INTRAVENOUS

## 2023-08-21 MED ORDER — PROPOFOL 10 MG/ML IV BOLUS
INTRAVENOUS | Status: DC | PRN
  Administered 2023-08-21: 100 mg via INTRAVENOUS

## 2023-08-21 MED ORDER — PHENYLEPHRINE 80 MCG/ML (10ML) SYRINGE FOR IV PUSH (FOR BLOOD PRESSURE SUPPORT)
PREFILLED_SYRINGE | INTRAVENOUS | Status: AC
  Filled 2023-08-21: qty 10

## 2023-08-21 MED ORDER — CEFAZOLIN SODIUM-DEXTROSE 2-4 GM/100ML-% IV SOLN
INTRAVENOUS | Status: AC
  Filled 2023-08-21: qty 100

## 2023-08-21 MED ORDER — FENTANYL CITRATE (PF) 100 MCG/2ML IJ SOLN
INTRAMUSCULAR | Status: AC
  Filled 2023-08-21: qty 2

## 2023-08-21 MED ORDER — ACETAMINOPHEN 500 MG PO TABS
1000.0000 mg | ORAL_TABLET | Freq: Three times a day (TID) | ORAL | 0 refills | Status: AC
  Filled 2023-08-21: qty 84, 14d supply, fill #0

## 2023-08-21 MED ORDER — OXYCODONE HCL 5 MG PO TABS
ORAL_TABLET | ORAL | Status: AC
  Filled 2023-08-21: qty 1

## 2023-08-21 MED ORDER — PHENYLEPHRINE 80 MCG/ML (10ML) SYRINGE FOR IV PUSH (FOR BLOOD PRESSURE SUPPORT)
PREFILLED_SYRINGE | INTRAVENOUS | Status: DC | PRN
  Administered 2023-08-21 (×2): 80 ug via INTRAVENOUS

## 2023-08-21 MED ORDER — SODIUM CHLORIDE 0.9 % IR SOLN
Status: DC | PRN
  Administered 2023-08-21: 3000 mL

## 2023-08-21 MED ORDER — ONDANSETRON HCL 4 MG/2ML IJ SOLN
INTRAMUSCULAR | Status: AC
  Filled 2023-08-21: qty 2

## 2023-08-21 MED ORDER — LIDOCAINE 2% (20 MG/ML) 5 ML SYRINGE
INTRAMUSCULAR | Status: DC | PRN
  Administered 2023-08-21: 60 mg via INTRAVENOUS

## 2023-08-21 MED ORDER — DEXAMETHASONE SODIUM PHOSPHATE 10 MG/ML IJ SOLN
INTRAMUSCULAR | Status: DC | PRN
  Administered 2023-08-21: 5 mg via INTRAVENOUS

## 2023-08-21 MED ORDER — BUPIVACAINE HCL (PF) 0.25 % IJ SOLN
INTRAMUSCULAR | Status: DC | PRN
  Administered 2023-08-21: 20 mL

## 2023-08-21 MED ORDER — ONDANSETRON HCL 4 MG/2ML IJ SOLN
INTRAMUSCULAR | Status: DC | PRN
  Administered 2023-08-21: 4 mg via INTRAVENOUS

## 2023-08-21 MED ORDER — MIDAZOLAM HCL 2 MG/2ML IJ SOLN
INTRAMUSCULAR | Status: DC | PRN
  Administered 2023-08-21: 2 mg via INTRAVENOUS

## 2023-08-21 MED ORDER — OXYCODONE HCL 5 MG/5ML PO SOLN: 5.0000 mg | Freq: Once | ORAL | Status: AC | PRN

## 2023-08-21 MED ORDER — EPHEDRINE SULFATE-NACL 50-0.9 MG/10ML-% IV SOSY
PREFILLED_SYRINGE | INTRAVENOUS | Status: DC | PRN
  Administered 2023-08-21: 10 mg via INTRAVENOUS

## 2023-08-21 MED ORDER — TRAMADOL HCL 50 MG PO TABS
50.0000 mg | ORAL_TABLET | Freq: Four times a day (QID) | ORAL | 0 refills | Status: DC | PRN
  Filled 2023-08-21: qty 20, 5d supply, fill #0

## 2023-08-21 MED ORDER — BUPIVACAINE HCL (PF) 0.25 % IJ SOLN
INTRAMUSCULAR | Status: AC
  Filled 2023-08-21: qty 30

## 2023-08-21 MED ORDER — GABAPENTIN 300 MG PO CAPS
300.0000 mg | ORAL_CAPSULE | Freq: Once | ORAL | Status: AC
  Administered 2023-08-21: 300 mg via ORAL

## 2023-08-21 MED ORDER — FENTANYL CITRATE (PF) 100 MCG/2ML IJ SOLN
INTRAMUSCULAR | Status: DC | PRN
  Administered 2023-08-21: 50 ug via INTRAVENOUS

## 2023-08-21 MED ORDER — EPINEPHRINE PF 1 MG/ML IJ SOLN
INTRAMUSCULAR | Status: AC
  Filled 2023-08-21: qty 1

## 2023-08-21 MED ORDER — ASPIRIN 81 MG PO CHEW
81.0000 mg | CHEWABLE_TABLET | Freq: Two times a day (BID) | ORAL | 0 refills | Status: DC
  Filled 2023-08-21: qty 84, 42d supply, fill #0

## 2023-08-21 MED ORDER — ACETAMINOPHEN 500 MG PO TABS
ORAL_TABLET | ORAL | Status: AC
  Filled 2023-08-21: qty 2

## 2023-08-21 MED ORDER — LIDOCAINE 2% (20 MG/ML) 5 ML SYRINGE
INTRAMUSCULAR | Status: AC
  Filled 2023-08-21: qty 5

## 2023-08-21 MED ORDER — EPHEDRINE 5 MG/ML INJ
INTRAVENOUS | Status: AC
  Filled 2023-08-21: qty 5

## 2023-08-21 MED ORDER — ONDANSETRON HCL 4 MG PO TABS
4.0000 mg | ORAL_TABLET | Freq: Three times a day (TID) | ORAL | 0 refills | Status: AC | PRN
  Filled 2023-08-21: qty 10, 4d supply, fill #0

## 2023-08-21 MED ORDER — CELECOXIB 200 MG PO CAPS
200.0000 mg | ORAL_CAPSULE | Freq: Two times a day (BID) | ORAL | 0 refills | Status: AC
Start: 2023-08-21 — End: 2023-09-20
  Filled 2023-08-21: qty 60, 30d supply, fill #0

## 2023-08-21 SURGICAL SUPPLY — 25 items
BNDG ELASTIC 6INX 5YD STR LF (GAUZE/BANDAGES/DRESSINGS) ×1 IMPLANT
CHLORAPREP W/TINT 26 (MISCELLANEOUS) ×1 IMPLANT
CLSR STERI-STRIP ANTIMIC 1/2X4 (GAUZE/BANDAGES/DRESSINGS) ×1 IMPLANT
CUFF TRNQT CYL 34X4.125X (TOURNIQUET CUFF) ×1 IMPLANT
DISSECTOR 4.0MMX13CM CVD (MISCELLANEOUS) ×1 IMPLANT
DRAPE U-SHAPE 47X51 STRL (DRAPES) ×1 IMPLANT
DRAPE-T ARTHROSCOPY W/POUCH (DRAPES) ×1 IMPLANT
GAUZE SPONGE 4X4 12PLY STRL (GAUZE/BANDAGES/DRESSINGS) ×1 IMPLANT
GLOVE BIO SURGEON STRL SZ 6.5 (GLOVE) ×1 IMPLANT
GLOVE BIOGEL PI IND STRL 6.5 (GLOVE) ×1 IMPLANT
GLOVE BIOGEL PI IND STRL 8 (GLOVE) ×1 IMPLANT
GLOVE ECLIPSE 8.0 STRL XLNG CF (GLOVE) ×2 IMPLANT
GOWN STRL REUS W/ TWL LRG LVL3 (GOWN DISPOSABLE) ×1 IMPLANT
GOWN STRL REUS W/TWL XL LVL3 (GOWN DISPOSABLE) ×1 IMPLANT
KIT TURNOVER KIT B (KITS) ×1 IMPLANT
MANIFOLD NEPTUNE II (INSTRUMENTS) IMPLANT
NS IRRIG 1000ML POUR BTL (IV SOLUTION) IMPLANT
PACK ARTHROSCOPY DSU (CUSTOM PROCEDURE TRAY) ×1 IMPLANT
SLEEVE SCD COMPRESS KNEE MED (STOCKING) ×1 IMPLANT
SUT MNCRL AB 4-0 PS2 18 (SUTURE) ×1 IMPLANT
TOWEL GREEN STERILE FF (TOWEL DISPOSABLE) ×1 IMPLANT
TUBE CONNECTING 20X1/4 (TUBING) ×1 IMPLANT
TUBING ARTHROSCOPY IRRIG 16FT (MISCELLANEOUS) ×1 IMPLANT
WAND ABLATOR APOLLO I90 (BUR) IMPLANT
WATER STERILE IRR 1000ML POUR (IV SOLUTION) ×1 IMPLANT

## 2023-08-21 NOTE — Anesthesia Procedure Notes (Signed)
 Procedure Name: LMA Insertion Date/Time: 08/21/2023 7:33 AM  Performed by: Francie Massing, CRNAPre-anesthesia Checklist: Patient identified, Emergency Drugs available, Suction available and Patient being monitored Patient Re-evaluated:Patient Re-evaluated prior to induction Oxygen Delivery Method: Circle system utilized Preoxygenation: Pre-oxygenation with 100% oxygen Induction Type: IV induction Ventilation: Mask ventilation without difficulty LMA: LMA inserted LMA Size: 4.0 Number of attempts: 1 Airway Equipment and Method: Bite block Placement Confirmation: positive ETCO2 Tube secured with: Tape Dental Injury: Teeth and Oropharynx as per pre-operative assessment

## 2023-08-21 NOTE — Interval H&P Note (Signed)
 All questions answered, patient wants to proceed with procedure. ? ?

## 2023-08-21 NOTE — Transfer of Care (Signed)
 Immediate Anesthesia Transfer of Care Note  Patient: Rachel Beasley  Procedure(s) Performed: Procedure(s) (LRB): KNEE ARTHROSCOPY WITH MEDIAL AND LATERAL  MENISECTOMIES (Right)  Patient Location: PACU  Anesthesia Type: General  Level of Consciousness: awake, oriented, sedated and patient cooperative  Airway & Oxygen Therapy: Patient Spontanous Breathing and Patient connected to face mask oxygen  Post-op Assessment: Report given to PACU RN and Post -op Vital signs reviewed and stable  Post vital signs: Reviewed and stable  Complications: No apparent anesthesia complications Last Vitals:  Vitals Value Taken Time  BP 105/53 08/21/23 0807  Temp 36.2 C 08/21/23 0807  Pulse 79 08/21/23 0810  Resp 13 08/21/23 0810  SpO2 100 % 08/21/23 0810  Vitals shown include unfiled device data.  Last Pain:  Vitals:   08/21/23 9562  TempSrc: Oral  PainSc: 0-No pain      Patients Stated Pain Goal: 4 (08/21/23 1308)  Complications: No notable events documented.

## 2023-08-21 NOTE — Op Note (Signed)
 Orthopaedic Surgery Operative Note (CSN: [id number])  Rachel Beasley  [birth year] Date of Surgery: 08/21/2023   Diagnoses:  MEDIAL AND LATERAL MENISCUS TEARS, RIGHT KNEE  Procedure: Right medial and lateral meniscectomy   Operative Finding Medial compartment had grade 1 and 2 softening of the medial femoral condyle there was a complex medial meniscus tear that was in the central portion and not repairable, 10 to 15% total meniscal volume resected.  ACL was normal patellofemoral compartment had grade 2 and 3 changes in the apex of the patella the trochlea was intact.  This was debrided back, the lateral compartment had a complex posterior lateral meniscus tear near the root but the root was intact.  About 10 to 15% total meniscal volume resected laterally.  Successful completion of the planned procedure.    Post-operative plan: The patient will be WBAT.  The patient will be discharged home.  DVT prophylaxis Aspirin 81 mg twice daily for 6 weeks.  Pain control with PRN pain medication preferring oral medicines.  Follow up plan will be scheduled in approximately 7 days for incision check.  Post-Op Diagnosis: Same Surgeons:Primary: Bjorn Pippin, MD Assistants:Caroline McBane, PA-C Location: [facility] OR ROOM 6 Anesthesia: General with local Antibiotics: Ancef 2 g Tourniquet time:  Estimated Blood Loss: Minimal Complications: None Specimens: None Implants: * No implants in log *  Indications for Surgery:   Rachel Beasley is a 68 y.o. female with meniscus tear and mechanical symptoms.  Benefits and risks of operative and nonoperative management were discussed prior to surgery with patient/guardian(s) and informed consent form was completed.  Specific risks including infection, need for additional surgery, postmeniscectomy syndrome, continued arthrosis and pain amongst others.   Procedure:   The patient was identified properly. Informed consent was obtained and the surgical site was marked. The  patient was taken up to suite where general anesthesia was induced. The patient was placed in the supine position with a post against the surgical leg and a nonsterile tourniquet applied. The surgical leg was then prepped and draped usual sterile fashion.  A standard surgical timeout was performed.  2 standard anterior portals were made and diagnostic arthroscopy performed. Please note the findings as noted above.  We used a shaver to perform a synovectomy of the anterior medial and anterolateral compartments as well as overgrowth along the patella.  We performed a medial and lateral meniscectomy using a shaver and basket back to a stable base removing all loose fragments.  Incisions closed with absorbable suture. The patient was awoken from general anesthesia and taken to the PACU in stable condition without complication.

## 2023-08-21 NOTE — Anesthesia Postprocedure Evaluation (Signed)
 Anesthesia Post Note  Patient: Rachel Beasley  Procedure(s) Performed: KNEE ARTHROSCOPY WITH MEDIAL AND LATERAL  MENISECTOMIES (Right: Knee)     Patient location during evaluation: PACU Anesthesia Type: General Level of consciousness: awake and alert Pain management: pain level controlled Vital Signs Assessment: post-procedure vital signs reviewed and stable Respiratory status: spontaneous breathing, nonlabored ventilation and respiratory function stable Cardiovascular status: blood pressure returned to baseline Postop Assessment: no apparent nausea or vomiting Anesthetic complications: no   No notable events documented.  Last Vitals:  Vitals:   08/21/23 0830 08/21/23 0845  BP: (!) 89/48 (!) 93/56  Pulse: 70 71  Resp: 10 16  Temp:    SpO2: 95% 98%    Last Pain:  Vitals:   08/21/23 0830  TempSrc:   PainSc: 3                  Shanda Howells

## 2023-08-22 ENCOUNTER — Encounter (HOSPITAL_BASED_OUTPATIENT_CLINIC_OR_DEPARTMENT_OTHER): Payer: Self-pay | Admitting: Orthopaedic Surgery

## 2023-08-26 DIAGNOSIS — M6281 Muscle weakness (generalized): Secondary | ICD-10-CM | POA: Diagnosis not present

## 2023-08-26 DIAGNOSIS — M25661 Stiffness of right knee, not elsewhere classified: Secondary | ICD-10-CM | POA: Diagnosis not present

## 2023-08-26 DIAGNOSIS — S83271D Complex tear of lateral meniscus, current injury, right knee, subsequent encounter: Secondary | ICD-10-CM | POA: Diagnosis not present

## 2023-08-26 DIAGNOSIS — S83231D Complex tear of medial meniscus, current injury, right knee, subsequent encounter: Secondary | ICD-10-CM | POA: Diagnosis not present

## 2023-08-27 DIAGNOSIS — H00014 Hordeolum externum left upper eyelid: Secondary | ICD-10-CM | POA: Diagnosis not present

## 2023-09-07 ENCOUNTER — Other Ambulatory Visit (HOSPITAL_BASED_OUTPATIENT_CLINIC_OR_DEPARTMENT_OTHER): Payer: Self-pay | Admitting: Obstetrics & Gynecology

## 2023-09-07 DIAGNOSIS — Z79890 Hormone replacement therapy (postmenopausal): Secondary | ICD-10-CM

## 2023-09-08 ENCOUNTER — Other Ambulatory Visit (HOSPITAL_COMMUNITY): Payer: Self-pay

## 2023-09-08 ENCOUNTER — Encounter (HOSPITAL_COMMUNITY): Payer: Self-pay

## 2023-09-08 DIAGNOSIS — M25661 Stiffness of right knee, not elsewhere classified: Secondary | ICD-10-CM | POA: Diagnosis not present

## 2023-09-08 DIAGNOSIS — S83271D Complex tear of lateral meniscus, current injury, right knee, subsequent encounter: Secondary | ICD-10-CM | POA: Diagnosis not present

## 2023-09-08 DIAGNOSIS — S83231D Complex tear of medial meniscus, current injury, right knee, subsequent encounter: Secondary | ICD-10-CM | POA: Diagnosis not present

## 2023-09-08 DIAGNOSIS — M6281 Muscle weakness (generalized): Secondary | ICD-10-CM | POA: Diagnosis not present

## 2023-09-08 NOTE — Telephone Encounter (Signed)
 Called and spoke with pt in regards to rx refill. Pt stated she has enough pills to last her until her appt on Thursday 09/11/23.

## 2023-09-11 ENCOUNTER — Other Ambulatory Visit (HOSPITAL_COMMUNITY)
Admission: RE | Admit: 2023-09-11 | Discharge: 2023-09-11 | Disposition: A | Discharge status: Home / Self Care | Source: Ambulatory Visit | Attending: Obstetrics & Gynecology | Admitting: Obstetrics & Gynecology

## 2023-09-11 ENCOUNTER — Encounter (HOSPITAL_BASED_OUTPATIENT_CLINIC_OR_DEPARTMENT_OTHER): Payer: Self-pay | Admitting: Obstetrics & Gynecology

## 2023-09-11 ENCOUNTER — Other Ambulatory Visit (HOSPITAL_COMMUNITY): Payer: Self-pay

## 2023-09-11 ENCOUNTER — Other Ambulatory Visit: Payer: Self-pay

## 2023-09-11 ENCOUNTER — Ambulatory Visit (HOSPITAL_BASED_OUTPATIENT_CLINIC_OR_DEPARTMENT_OTHER): Payer: 59 | Admitting: Obstetrics & Gynecology

## 2023-09-11 VITALS — BP 108/64 | HR 72 | Ht 64.0 in | Wt 112.0 lb

## 2023-09-11 DIAGNOSIS — M85852 Other specified disorders of bone density and structure, left thigh: Secondary | ICD-10-CM | POA: Diagnosis not present

## 2023-09-11 DIAGNOSIS — Z124 Encounter for screening for malignant neoplasm of cervix: Secondary | ICD-10-CM

## 2023-09-11 DIAGNOSIS — F5101 Primary insomnia: Secondary | ICD-10-CM

## 2023-09-11 DIAGNOSIS — M85851 Other specified disorders of bone density and structure, right thigh: Secondary | ICD-10-CM

## 2023-09-11 DIAGNOSIS — Z79890 Hormone replacement therapy (postmenopausal): Secondary | ICD-10-CM

## 2023-09-11 DIAGNOSIS — Z01419 Encounter for gynecological examination (general) (routine) without abnormal findings: Secondary | ICD-10-CM | POA: Diagnosis not present

## 2023-09-11 DIAGNOSIS — F5109 Other insomnia not due to a substance or known physiological condition: Secondary | ICD-10-CM

## 2023-09-11 MED ORDER — PROGESTERONE 200 MG PO CAPS
200.0000 mg | ORAL_CAPSULE | Freq: Every day | ORAL | 3 refills | Status: AC
  Filled 2023-09-11: qty 90, 90d supply, fill #0
  Filled 2023-12-06: qty 90, 90d supply, fill #1
  Filled 2024-03-07: qty 90, 90d supply, fill #2
  Filled 2024-06-03: qty 90, 90d supply, fill #3

## 2023-09-11 MED ORDER — ESTRADIOL 0.025 MG/24HR TD PTTW
1.0000 | MEDICATED_PATCH | TRANSDERMAL | 3 refills | Status: AC
  Filled 2023-09-11 – 2023-12-06 (×2): qty 24, 84d supply, fill #0
  Filled 2024-03-07: qty 24, 84d supply, fill #1

## 2023-09-11 MED ORDER — ZOLPIDEM TARTRATE 5 MG PO TABS
5.0000 mg | ORAL_TABLET | Freq: Every evening | ORAL | 1 refills | Status: AC | PRN
  Filled 2023-09-11: qty 30, 30d supply, fill #0

## 2023-09-11 NOTE — Progress Notes (Signed)
 ANNUAL EXAM Patient name: Rachel Beasley MRN [id number]  Date of birth: [birth year] Chief Complaint:   AEX  History of Present Illness:   Rachel Beasley is a 68 y.o. G60P0103 Caucasian female being seen today for a routine annual exam.  Had a torn meniscus.  Had arthroscopy.  Doing PT.    Denies vaginal bleeding.    Patient's last menstrual period was 08/12/2011 (exact date).  Last pap 08/28/2020. Results were: NILM w/ HRHPV negative. H/O abnormal pap: no Last mammogram: 10/29/2022. Results were: normal. Family h/o breast cancer: no Last colonoscopy: 01/23/2021 . Results were: sessile polyp Dexa:  10/29/2022, osteopenia     09/05/2022    2:29 PM 08/30/2021    2:25 PM 11/08/2013    9:01 AM  Depression screen PHQ 2/9  Decreased Interest 0 0 0  Down, Depressed, Hopeless 0 0 0  PHQ - 2 Score 0 0 0    Review of Systems:   Pertinent items are noted in HPI  Denies any vaginal bleeding or vaginal discharge, bowel or bladder changes Pertinent History Reviewed:  Reviewed past medical,surgical, social and family history.   Reviewed problem list, medications and allergies. Physical Assessment:   Vitals:   09/11/23 1348  BP: 108/64  Pulse: 72  Weight: 112 lb (50.8 kg)  Height: 5\' 4"  (1.626 m)  Body mass index is 19.22 kg/m.        Physical Examination:   General appearance - well appearing, and in no distress  Mental status - alert, oriented to person, place, and time  Psych:  She has a normal mood and affect  Skin - warm and dry, normal color, no suspicious lesions noted  Chest - effort normal, all lung fields clear to auscultation bilaterally  Heart - normal rate and regular rhythm  Neck:  midline trachea, no thyromegaly or nodules  Breasts - breasts appear normal, no suspicious masses, no skin or nipple changes or  axillary nodes  Abdomen - soft, nontender, nondistended, no masses or organomegaly  Pelvic - VULVA: normal appearing vulva with no masses, tenderness or lesions   V  VAGINA: normal appearing vagina with normal color and discharge, no lesions    CERVIX: normal appearing cervix without discharge or lesions, no CMT  Thin prep pap is done with HR HPV cotesting  UTERUS: uterus is felt to be normal size, shape, consistency and nontender   ADNEXA: No adnexal masses or tenderness noted.  Rectal - normal rectal, good sphincter tone, no masses felt  Extremities:  No swelling or varicosities noted  Chaperone present for exam Assessment & Plan:  1. Well woman exam with routine gynecological exam (Primary) - Pap smear with HR HPV obtained tdoday - Mammogram 10/30/2023 - Colonoscopy 2022 - Bone mineral density 10/2022 - lab work done with PCP, Dr. Chales Salmon - vaccines reviewed/updated.  Pneumonia vaccination discussed.  2. Cervical cancer screening - Cytology - PAP( )  3. Hormone replacement therapy (HRT) - estradiol (VIVELLE-DOT) 0.025 MG/24HR; Place 1 patch onto the skin 2 (two) times a week.  Dispense: 24 patch; Refill: 3 - progesterone (PROMETRIUM) 200 MG capsule; Take 1 capsule (200 mg total) by mouth at bedtime.  Dispense: 90 capsule; Refill: 3  4. Osteopenia of necks of both femurs    Meds:  Meds ordered this encounter  Medications   estradiol (VIVELLE-DOT) 0.025 MG/24HR    Sig: Place 1 patch onto the skin 2 (two) times a week.    Dispense:  24 patch  Refill:  3   progesterone (PROMETRIUM) 200 MG capsule    Sig: Take 1 capsule (200 mg total) by mouth at bedtime.    Dispense:  90 capsule    Refill:  3   zolpidem (AMBIEN) 5 MG tablet    Sig: Take 1 tablet (5 mg total) by mouth at bedtime as needed for sleep.    Dispense:  30 tablet    Refill:  1    Follow-up: Return in about 1 year (around 09/10/2024).  Jerene Bears, MD 09/11/2023 2:31 PM

## 2023-09-15 DIAGNOSIS — M6281 Muscle weakness (generalized): Secondary | ICD-10-CM | POA: Diagnosis not present

## 2023-09-15 DIAGNOSIS — M25661 Stiffness of right knee, not elsewhere classified: Secondary | ICD-10-CM | POA: Diagnosis not present

## 2023-09-15 DIAGNOSIS — S83231D Complex tear of medial meniscus, current injury, right knee, subsequent encounter: Secondary | ICD-10-CM | POA: Diagnosis not present

## 2023-09-15 DIAGNOSIS — S83271D Complex tear of lateral meniscus, current injury, right knee, subsequent encounter: Secondary | ICD-10-CM | POA: Diagnosis not present

## 2023-09-18 LAB — CYTOLOGY - PAP
Comment: NEGATIVE
Diagnosis: NEGATIVE
Diagnosis: REACTIVE
High risk HPV: NEGATIVE

## 2023-09-22 ENCOUNTER — Encounter (HOSPITAL_BASED_OUTPATIENT_CLINIC_OR_DEPARTMENT_OTHER): Payer: Self-pay | Admitting: Obstetrics & Gynecology

## 2023-11-18 DIAGNOSIS — M47812 Spondylosis without myelopathy or radiculopathy, cervical region: Secondary | ICD-10-CM | POA: Diagnosis not present

## 2023-11-18 DIAGNOSIS — M5440 Lumbago with sciatica, unspecified side: Secondary | ICD-10-CM | POA: Diagnosis not present

## 2023-12-08 ENCOUNTER — Other Ambulatory Visit (HOSPITAL_COMMUNITY): Payer: Self-pay

## 2023-12-10 ENCOUNTER — Other Ambulatory Visit (HOSPITAL_COMMUNITY): Payer: Self-pay

## 2024-01-21 DIAGNOSIS — H5203 Hypermetropia, bilateral: Secondary | ICD-10-CM | POA: Diagnosis not present

## 2024-01-21 DIAGNOSIS — H524 Presbyopia: Secondary | ICD-10-CM | POA: Diagnosis not present

## 2024-01-26 DIAGNOSIS — Z0000 Encounter for general adult medical examination without abnormal findings: Secondary | ICD-10-CM | POA: Diagnosis not present

## 2024-01-26 DIAGNOSIS — R635 Abnormal weight gain: Secondary | ICD-10-CM | POA: Diagnosis not present

## 2024-01-26 DIAGNOSIS — E559 Vitamin D deficiency, unspecified: Secondary | ICD-10-CM | POA: Diagnosis not present

## 2024-01-26 DIAGNOSIS — Z91030 Bee allergy status: Secondary | ICD-10-CM | POA: Diagnosis not present

## 2024-01-26 DIAGNOSIS — Z23 Encounter for immunization: Secondary | ICD-10-CM | POA: Diagnosis not present

## 2024-01-27 ENCOUNTER — Other Ambulatory Visit (HOSPITAL_COMMUNITY): Payer: Self-pay

## 2024-01-28 ENCOUNTER — Other Ambulatory Visit (HOSPITAL_COMMUNITY): Payer: Self-pay

## 2024-01-28 MED ORDER — ERGOCALCIFEROL 1.25 MG (50000 UT) PO CAPS
1.0000 | ORAL_CAPSULE | ORAL | 0 refills | Status: AC
  Filled 2024-01-28: qty 12, 84d supply, fill #0

## 2024-01-28 MED ORDER — EPINEPHRINE 0.3 MG/0.3ML IJ SOAJ
0.3000 mg | Freq: Every day | INTRAMUSCULAR | 0 refills | Status: AC | PRN
  Filled 2024-01-28: qty 2, 2d supply, fill #0

## 2024-01-28 MED ORDER — EPINEPHRINE 0.3 MG/0.3ML IJ SOAJ
0.3000 mg | INTRAMUSCULAR | 0 refills | Status: AC | PRN
  Filled 2024-01-28: qty 2, 15d supply, fill #0

## 2024-01-29 ENCOUNTER — Other Ambulatory Visit: Payer: Self-pay

## 2024-03-01 DIAGNOSIS — L814 Other melanin hyperpigmentation: Secondary | ICD-10-CM | POA: Diagnosis not present

## 2024-03-01 DIAGNOSIS — D224 Melanocytic nevi of scalp and neck: Secondary | ICD-10-CM | POA: Diagnosis not present

## 2024-03-01 DIAGNOSIS — L821 Other seborrheic keratosis: Secondary | ICD-10-CM | POA: Diagnosis not present

## 2024-03-01 DIAGNOSIS — L820 Inflamed seborrheic keratosis: Secondary | ICD-10-CM | POA: Diagnosis not present

## 2024-03-01 DIAGNOSIS — D485 Neoplasm of uncertain behavior of skin: Secondary | ICD-10-CM | POA: Diagnosis not present

## 2024-06-04 ENCOUNTER — Other Ambulatory Visit (HOSPITAL_COMMUNITY): Payer: Self-pay

## 2024-06-22 ENCOUNTER — Other Ambulatory Visit (HOSPITAL_COMMUNITY): Payer: Self-pay

## 2024-06-22 MED ORDER — ERYTHROMYCIN 5 MG/GM OP OINT
1.0000 | TOPICAL_OINTMENT | Freq: Two times a day (BID) | OPHTHALMIC | 0 refills | Status: AC
  Filled 2024-06-22: qty 3.5, 5d supply, fill #0

## 2024-06-22 MED ORDER — DOXYCYCLINE HYCLATE 100 MG PO TABS
100.0000 mg | ORAL_TABLET | Freq: Two times a day (BID) | ORAL | 0 refills | Status: AC
  Filled 2024-06-22: qty 20, 10d supply, fill #0

## 2024-09-16 ENCOUNTER — Ambulatory Visit (HOSPITAL_BASED_OUTPATIENT_CLINIC_OR_DEPARTMENT_OTHER): Admitting: Obstetrics & Gynecology

## 2024-10-27 ENCOUNTER — Ambulatory Visit (HOSPITAL_BASED_OUTPATIENT_CLINIC_OR_DEPARTMENT_OTHER): Payer: Self-pay | Admitting: Obstetrics & Gynecology
